# Patient Record
Sex: Female | Born: 1980 | Race: Black or African American | Hispanic: No | Marital: Single | Smoking: Never smoker
Health system: Southern US, Community
[De-identification: ages and names within clinical notes are randomized; demographics above are authoritative.]

## PROBLEM LIST (undated history)

## (undated) DIAGNOSIS — R51 Headache: Secondary | ICD-10-CM

## (undated) DIAGNOSIS — M1990 Unspecified osteoarthritis, unspecified site: Secondary | ICD-10-CM

## (undated) DIAGNOSIS — M549 Dorsalgia, unspecified: Secondary | ICD-10-CM

## (undated) DIAGNOSIS — M25562 Pain in left knee: Secondary | ICD-10-CM

## (undated) DIAGNOSIS — M25561 Pain in right knee: Secondary | ICD-10-CM

## (undated) DIAGNOSIS — Z3480 Encounter for supervision of other normal pregnancy, unspecified trimester: Secondary | ICD-10-CM

## (undated) DIAGNOSIS — R519 Headache, unspecified: Secondary | ICD-10-CM

## (undated) DIAGNOSIS — D649 Anemia, unspecified: Secondary | ICD-10-CM

## (undated) DIAGNOSIS — M25569 Pain in unspecified knee: Secondary | ICD-10-CM

## (undated) DIAGNOSIS — IMO0001 Reserved for inherently not codable concepts without codable children: Secondary | ICD-10-CM

## (undated) HISTORY — DX: Reserved for inherently not codable concepts without codable children: IMO0001

## (undated) HISTORY — DX: Pain in unspecified knee: M25.569

## (undated) HISTORY — DX: Encounter for supervision of other normal pregnancy, unspecified trimester: Z34.80

## (undated) HISTORY — PX: CARPAL TUNNEL RELEASE: SHX101

## (undated) HISTORY — PX: WISDOM TOOTH EXTRACTION: SHX21

## (undated) HISTORY — DX: Pain in left knee: M25.562

## (undated) HISTORY — DX: Dorsalgia, unspecified: M54.9

## (undated) HISTORY — DX: Pain in right knee: M25.561

---

## 1998-08-14 ENCOUNTER — Emergency Department (HOSPITAL_COMMUNITY): Admission: EM | Admit: 1998-08-14 | Discharge: 1998-08-14 | Payer: Self-pay | Admitting: Emergency Medicine

## 1999-08-08 ENCOUNTER — Inpatient Hospital Stay (HOSPITAL_COMMUNITY): Admission: AD | Admit: 1999-08-08 | Discharge: 1999-08-08 | Payer: Self-pay | Admitting: *Deleted

## 1999-10-30 ENCOUNTER — Inpatient Hospital Stay (HOSPITAL_COMMUNITY): Admission: AD | Admit: 1999-10-30 | Discharge: 1999-10-30 | Payer: Self-pay | Admitting: *Deleted

## 1999-10-31 ENCOUNTER — Ambulatory Visit (HOSPITAL_COMMUNITY): Admission: RE | Admit: 1999-10-31 | Discharge: 1999-10-31 | Payer: Self-pay | Admitting: *Deleted

## 1999-11-15 ENCOUNTER — Inpatient Hospital Stay (HOSPITAL_COMMUNITY): Admission: AD | Admit: 1999-11-15 | Discharge: 1999-11-15 | Payer: Self-pay | Admitting: *Deleted

## 2000-03-13 ENCOUNTER — Inpatient Hospital Stay (HOSPITAL_COMMUNITY): Admission: AD | Admit: 2000-03-13 | Discharge: 2000-03-13 | Payer: Self-pay | Admitting: Obstetrics

## 2000-03-24 ENCOUNTER — Inpatient Hospital Stay (HOSPITAL_COMMUNITY): Admission: AD | Admit: 2000-03-24 | Discharge: 2000-03-24 | Payer: Self-pay | Admitting: Obstetrics & Gynecology

## 2000-03-25 ENCOUNTER — Encounter (HOSPITAL_COMMUNITY): Admission: RE | Admit: 2000-03-25 | Discharge: 2000-04-03 | Payer: Self-pay | Admitting: Obstetrics & Gynecology

## 2000-04-02 ENCOUNTER — Inpatient Hospital Stay (HOSPITAL_COMMUNITY): Admission: AD | Admit: 2000-04-02 | Discharge: 2000-04-04 | Payer: Self-pay | Admitting: Obstetrics

## 2000-11-06 ENCOUNTER — Emergency Department (HOSPITAL_COMMUNITY): Admission: EM | Admit: 2000-11-06 | Discharge: 2000-11-06 | Payer: Self-pay | Admitting: Emergency Medicine

## 2000-11-06 ENCOUNTER — Encounter: Payer: Self-pay | Admitting: Emergency Medicine

## 2001-06-02 ENCOUNTER — Emergency Department (HOSPITAL_COMMUNITY): Admission: EM | Admit: 2001-06-02 | Discharge: 2001-06-03 | Payer: Self-pay | Admitting: Emergency Medicine

## 2001-09-26 ENCOUNTER — Inpatient Hospital Stay (HOSPITAL_COMMUNITY): Admission: AD | Admit: 2001-09-26 | Discharge: 2001-09-26 | Payer: Self-pay | Admitting: Obstetrics and Gynecology

## 2001-09-26 ENCOUNTER — Encounter: Payer: Self-pay | Admitting: Obstetrics and Gynecology

## 2001-09-26 ENCOUNTER — Encounter: Payer: Self-pay | Admitting: Emergency Medicine

## 2002-09-06 ENCOUNTER — Emergency Department (HOSPITAL_COMMUNITY): Admission: EM | Admit: 2002-09-06 | Discharge: 2002-09-06 | Payer: Self-pay | Admitting: Emergency Medicine

## 2002-11-06 ENCOUNTER — Emergency Department (HOSPITAL_COMMUNITY): Admission: EM | Admit: 2002-11-06 | Discharge: 2002-11-06 | Payer: Self-pay | Admitting: Emergency Medicine

## 2002-11-06 ENCOUNTER — Encounter: Payer: Self-pay | Admitting: Emergency Medicine

## 2002-12-13 ENCOUNTER — Inpatient Hospital Stay (HOSPITAL_COMMUNITY): Admission: AD | Admit: 2002-12-13 | Discharge: 2002-12-13 | Payer: Self-pay | Admitting: Obstetrics and Gynecology

## 2002-12-13 ENCOUNTER — Encounter: Payer: Self-pay | Admitting: Obstetrics and Gynecology

## 2002-12-29 ENCOUNTER — Other Ambulatory Visit: Admission: RE | Admit: 2002-12-29 | Discharge: 2002-12-29 | Payer: Self-pay | Admitting: Obstetrics and Gynecology

## 2003-07-08 ENCOUNTER — Inpatient Hospital Stay (HOSPITAL_COMMUNITY): Admission: AD | Admit: 2003-07-08 | Discharge: 2003-07-08 | Payer: Self-pay | Admitting: Obstetrics and Gynecology

## 2003-07-09 ENCOUNTER — Inpatient Hospital Stay (HOSPITAL_COMMUNITY): Admission: AD | Admit: 2003-07-09 | Discharge: 2003-07-11 | Payer: Self-pay | Admitting: Obstetrics and Gynecology

## 2004-01-10 ENCOUNTER — Other Ambulatory Visit: Admission: RE | Admit: 2004-01-10 | Discharge: 2004-01-21 | Payer: Self-pay | Admitting: Advanced Practice Midwife

## 2005-02-02 ENCOUNTER — Other Ambulatory Visit: Admission: RE | Admit: 2005-02-02 | Discharge: 2005-02-02 | Payer: Self-pay | Admitting: Obstetrics and Gynecology

## 2005-08-17 ENCOUNTER — Emergency Department (HOSPITAL_COMMUNITY): Admission: EM | Admit: 2005-08-17 | Discharge: 2005-08-17 | Payer: Self-pay | Admitting: Family Medicine

## 2006-07-29 ENCOUNTER — Emergency Department (HOSPITAL_COMMUNITY): Admission: EM | Admit: 2006-07-29 | Discharge: 2006-07-29 | Payer: Self-pay | Admitting: Family Medicine

## 2006-09-28 ENCOUNTER — Emergency Department (HOSPITAL_COMMUNITY): Admission: EM | Admit: 2006-09-28 | Discharge: 2006-09-28 | Payer: Self-pay | Admitting: Emergency Medicine

## 2007-06-18 ENCOUNTER — Emergency Department (HOSPITAL_COMMUNITY): Admission: EM | Admit: 2007-06-18 | Discharge: 2007-06-18 | Payer: Self-pay | Admitting: Family Medicine

## 2007-07-27 ENCOUNTER — Inpatient Hospital Stay (HOSPITAL_COMMUNITY): Admission: AD | Admit: 2007-07-27 | Discharge: 2007-07-29 | Payer: Self-pay | Admitting: Obstetrics and Gynecology

## 2007-11-15 ENCOUNTER — Emergency Department (HOSPITAL_COMMUNITY): Admission: EM | Admit: 2007-11-15 | Discharge: 2007-11-15 | Payer: Self-pay | Admitting: Family Medicine

## 2007-12-04 ENCOUNTER — Emergency Department (HOSPITAL_COMMUNITY): Admission: EM | Admit: 2007-12-04 | Discharge: 2007-12-04 | Payer: Self-pay | Admitting: Emergency Medicine

## 2008-02-16 ENCOUNTER — Emergency Department (HOSPITAL_COMMUNITY): Admission: EM | Admit: 2008-02-16 | Discharge: 2008-02-16 | Payer: Self-pay | Admitting: Emergency Medicine

## 2009-02-14 ENCOUNTER — Emergency Department (HOSPITAL_COMMUNITY): Admission: EM | Admit: 2009-02-14 | Discharge: 2009-02-14 | Payer: Self-pay | Admitting: Emergency Medicine

## 2010-04-26 ENCOUNTER — Emergency Department (HOSPITAL_COMMUNITY): Admission: EM | Admit: 2010-04-26 | Discharge: 2010-04-26 | Payer: Self-pay | Admitting: Family Medicine

## 2011-03-27 NOTE — H&P (Signed)
NAME:  Diane Harvey, Diane Harvey                  ACCOUNT NO.:  [id number]   MEDICAL RECORD NO.:  [id number]          PATIENT TYPE:  INP   LOCATION:  9133                          FACILITY:  [facility]   PHYSICIAN:  Naima A. Dillard, M.D. DATE OF BIRTH:  [birth year]   DATE OF ADMISSION:  07/27/2007  DATE OF DISCHARGE:                              HISTORY & PHYSICAL   HISTORY OF PRESENT ILLNESS:  The patient is a 30 year old, G3, P2-0-0-2  who presents at 41-1/7 weeks with complaints of increased frequency of  uterine contractions with increased intensity as well.  The patient  denies leakage of fluid or bleeding.  The patient reports her fetus has  been moving normally.  The patient denies any toxic signs or symptoms.  The patient's Group B Streptococcus is negative.  The patient's last  cervical exam on Thursday, September 11, was closed, per patient.  Pregnancy is remarkable for long cycle.  The patient's pregnancy has  been followed by the CNM service at [facility].   ALLERGIES:  No known drug allergies.   CURRENT MEDICATIONS:  Prenatal vitamins.   PAST OBSTETRICAL HISTORY:  Pregnancy #1 in May 2001, with spontaneous  vaginal delivery at 41 weeks of a female infant, 7 pounds 0 ounces, 20-  hour labor, no complications.  Pregnancy #2 in August 2004, spontaneous  vaginal delivery at 42 weeks of a female infant, 6 pounds 0 ounces, 5-hour  labor with no complications.  Pregnancy #3, current and different  paternity.  No obstetric complications.   PAST GYNECOLOGICAL HISTORY:  The patient reports regular monthly menses  with heavy flow.  The patient denies any history of abnormal Pap smears  or STDs.  The patient does have a history of bacterial and yeast  infections in the past.   PAST SURGICAL HISTORY:  Negative.   PAST MEDICAL HISTORY:  Negative.   FAMILY HISTORY:  Mother with chronic hypertension.  Father with chronic  hypertension.  Sister with asthma.  Paternal grandmother with  Alzheimer's.  Father with diabetes.   SOCIAL HISTORY:  The patient is separated currently.  Father of the baby  is Brennan Bailey.  He is involved and supportive.  The patient works in  Engineering geologist.  Father of the baby is a Estate agent.  The patient denies  use of alcohol, tobacco or street drugs.   PRENATAL LABORATORY DATA:  Initial hemoglobin 11.3, platelets 334,000.  Blood type O positive.  Antibody screen negative.  Sickle cell trait  negative.  RPR nonreactive.  Rubella titer immune.  Hepatitis B surface  antigen negative.  HIV was declined.  Pap smear on January 24, 2007,  within normal limits.  Gonorrhea and Chlamydia test negative x2.  Cystic  fibrosis negative.  Group B Streptococcus negative.  Glucola 87.   PRENATAL COURSE:  The patient entered care at 55 weeks' gestation.  Quad  screen was drawn and was within normal limits.  The patient was treated  for bacterial vaginosis at 17 weeks with metranidazole.  The patient  underwent ultrasound at [redacted] weeks gestation by dates and had size  not  consistent with dates.  EDC changed to July 19, 2007.  Placenta  posterior and cervix long and closed.  Normal anatomy and normal fluid.  Fetal left kidney not well-visualized.  Repeat ultrasound obtained at 22  weeks for complete visualization of the fetal renal structures and at  that time left kidney appeared normal with a normal AFI and cervix 4.4  cm.  Glucola was obtained at 29 weeks with a normal result.  The patient  underwent Group B Streptococcus culture at 35-36 weeks.  The patient  also reports ultrasound performed on September 11, at which time fluid  volume was reported to her as normal, baby in vertex presentation and  estimated fetal weight at 7 pounds approximately.   PHYSICAL EXAMINATION:  VITAL SIGNS:  The patient is afebrile.  Vital  signs are stable.  Blood pressure is 118/75.  HEART:  Regular rate and rhythm.  LUNGS:  Clear.  ABDOMEN:  Soft, gravid and nontender.   Fundal height consistent with 39  cm.  Fetus is vertex to Leopold's maneuvers.  Fetal heart rate is  reactive with a variable deceleration x1 to 110-120 beats per minute at  the initial onset of fetal heart rate tracing, however, spontaneous  recovery and reactive fetal heart rate after the variable deceleration.  Short-term variability is present.  Uterine contractions are noted every  4-5 minutes and moderate to palpation.  PELVIC:  Sterile vaginal exam reveals cervix 4-5 cm dilated, 60%  effaced, -3 station, vertex and posterior.  EXTREMITIES:  Negative edema and negative Homans' bilaterally.   ASSESSMENT/PLAN:  1. Intrauterine pregnancy at 41-1/7 weeks.  2. Early labor.  3. The patient will be admitted to birthing suites per consult with      Dr. Normand Sloop due to post dates and variable deceleration.  The      patient desires epidural for pain relief.  Routine certified nurse      midwife orders.  4. If having decreased contraction frequency, will consider Pitocin      augmentation.      Rhona Leavens, CNM      Naima A. Normand Sloop, M.D.  Electronically Signed    NOS/MEDQ  D:  07/27/2007  T:  07/28/2007  Job:  [id number]

## 2011-03-30 NOTE — H&P (Signed)
NAME:  Harvey, Diane C                            ACCOUNT NO.:  [id number]   MEDICAL RECORD NO.:  [id number]                   PATIENT TYPE:  INP   LOCATION:  9130                                 [facility]ACILITY:  [facility]   PHYSICIAN:  Naima A. Dillard, M.D.              DATE OF BIRTH:  01/27/1981   DATE OF ADMISSION:  07/09/2003  DATE OF DISCHARGE:                                HISTORY & PHYSICAL   HISTORY OF PRESENT ILLNESS:  Diane Harvey is a 30 year old married black  female, gravida 2, para  1-0-0-1, at 41-1/7 weeks who presents from the  office for late labor evaluation.  She reports regular uterine contractions  all night and MAU visit last evening showed her cervix to be 1 cm and 70%  effaced.  She denies leaking, bleeding, nausea, vomiting and visual  disturbances.  Her pregnancy has been followed by [facility]  Certified Nurse Midwife Service and has been remarkable for:  1. First trimester bleeding.  2. Fibroid.  3. Group B Streptococcus negative.  <./PRENATAL LABORATORY DATA/>  Were collected on December 13, 2002.  Hemoglobin 12.3, hematocrit was not  available, platelets 339,000.  Blood type O positive.  Rh negative.  Sickle  cell trait negative.  RPR nonreactive.  Rubella immune.  Hepatitis B surface  antigen negative.  Pap smear within normal limits.  Gonorrhea negative.  Chlamydia negative.  On February 16, 2003, her maternal serum alpha fetoprotein  was within normal range.  On April 14, 2003, her one hour Glucola was 86 and  her hemoglobin at that time was 11.4.  Culture of the vaginal tract for  Group B Streptococcus, on June 10, 2003, was negative and was negative for  gonorrhea and Chlamydia as well.   PRENATAL HISTORY:  She presented for prenatal care at [facility] on December 29, 2002, at [redacted] weeks gestation.  Pregnancy  ultrasonograph, performed by [facility], on December 13, 2002, gave an  [facility] of July 01, 2003.  Second pregnancy ultrasonograph at  [redacted] weeks  gestation shows growth consistent with previous dating; a fibroid was noted  as well on that ultrasound in addition to EIF in the left ventricle.  The  rest of her prenatal care was unremarkable.   OBSTETRICAL HISTORY:  She is a gravida 2, para 1-0-0-1.  In May 2001, she  vaginally delivered a female infant at [redacted] weeks gestation, weighing 7 pounds  14 ounces, after eight hours of labor.  She had an epidural for anesthesia.  The infant's name was Diane Harvey.  There were no complications with that  pregnancy or birth.   PAST MEDICAL HISTORY:  1. She has no medication allergies.  2. She reports using oral contraceptives, which she stopped a year ago.  3. She had frequent yeast infections with her first pregnancy.   PAST SURGICAL HISTORY:  Negative.   FAMILY HISTORY:  Remarkable for parents with chronic hypertension.  Paternal  grandmother with history of varicosities.  Father with type 1 diabetes.   GENETIC HISTORY:  Remarkable for patient's nieces born with extra digits.   SOCIAL HISTORY:  The patient is married to the father of the baby.  His name  is Careers adviser.  They both have high school educations and are employed full  time.  They deny any alcohol, tobacco or illicit drug use with the  pregnancy.   PHYSICAL EXAMINATION:  VITAL SIGNS:  Stable.  She is afebrile.  HEENT:  Grossly within normal limits.  CHEST:  Clear to auscultation.  HEART:  Regular rate and rhythm.  ABDOMEN:  Gravid in contour with fundal height extending approximately 41 cm  above the pubic symphysis.  Fetal heart rate is reactive and reassuring.  Uterine contractions q.2-4 min which are strong.  PELVIC:  Cervix 4 cm, 70%, vertex, -2, intact membranes.  EXTREMITIES:  Within normal limits.   ASSESSMENT:  1. Intrauterine pregnancy at term.  2. Early active labor.  3. Group B Streptococcus negative.   PLAN:  1. Admit to birthing suite for consultation with Dr. Normand Sloop.  2. Routine CNM orders.  3. The  patient plans epidural for labor.     Cam Hai, C.N.M.                     Naima A. Normand Sloop, M.D.    KS/MEDQ  D:  07/09/2003  T:  07/10/2003  Job:  [id number]

## 2011-07-31 ENCOUNTER — Encounter: Payer: Self-pay | Admitting: Family Medicine

## 2011-07-31 ENCOUNTER — Ambulatory Visit (INDEPENDENT_AMBULATORY_CARE_PROVIDER_SITE_OTHER): Payer: Medicaid Other | Admitting: Family Medicine

## 2011-07-31 VITALS — BP 127/91 | HR 88 | Ht 67.0 in | Wt 233.0 lb

## 2011-07-31 DIAGNOSIS — M25569 Pain in unspecified knee: Secondary | ICD-10-CM | POA: Insufficient documentation

## 2011-07-31 DIAGNOSIS — IMO0001 Reserved for inherently not codable concepts without codable children: Secondary | ICD-10-CM

## 2011-07-31 DIAGNOSIS — Z309 Encounter for contraceptive management, unspecified: Secondary | ICD-10-CM

## 2011-07-31 DIAGNOSIS — Z0000 Encounter for general adult medical examination without abnormal findings: Secondary | ICD-10-CM

## 2011-07-31 DIAGNOSIS — Z3049 Encounter for surveillance of other contraceptives: Secondary | ICD-10-CM

## 2011-07-31 HISTORY — DX: Reserved for inherently not codable concepts without codable children: IMO0001

## 2011-07-31 HISTORY — DX: Pain in unspecified knee: M25.569

## 2011-07-31 LAB — POCT URINE PREGNANCY: Preg Test, Ur: NEGATIVE

## 2011-07-31 MED ORDER — MEDROXYPROGESTERONE ACETATE 150 MG/ML IM SUSP
150.0000 mg | Freq: Once | INTRAMUSCULAR | Status: AC
  Administered 2011-07-31: 150 mg via INTRAMUSCULAR

## 2011-07-31 NOTE — Progress Notes (Signed)
  Subjective:    Patient ID: Diane Harvey, female    DOB: 15-Mar-1981, 30 y.o.   MRN: [id number]  HPI  Here to etsablish care.  Would like to resume getting depo shots for conraception.  States had a physical with normal pap and thinks she got fasting labs and cholesterol done in March.  Review of Systems Gen:  No fever, chills, unexplained weight loss Ears:  No hearing loss, ringing Eyes: No vision changes, double vision, eye drainage Nose:  No rhinorrhea, congestion Throat:  No sore throat or dysphagia CV:  No chest pain, palpitations, PND, dyspnea on exertion, or edema Resp: No cough, dyspnea, wheezing Abd: No nausea, vomting, diarrhea, constipation, or change in bowel color, size, or caliber. MSK: no joint pain, myalgias SKIN: no rash, changing moles GU: No dysuria, hematuria, vaginal discharge Neuro:  No headache, numbness, weakness, tingling, syncope.      Objective:   Physical Exam GEN: Alert & Oriented, No acute distress CV:  Regular Rate & Rhythm, no murmur Respiratory:  Normal work of breathing, CTAB Abd:  + BS, soft, no tenderness to palpation Ext: no pre-tibial edema        Assessment & Plan:

## 2011-07-31 NOTE — Patient Instructions (Signed)
Encourage daily activity See nutrition tips for weight loss.  Please feel free to schedule appt to discuss further.  Bring in 3 day log. Will wait on checking for diabetes and high cholesterol since you just had this done Will get records from your family doctor Follow-up with orthopedist for your knees.  Ice and ibuprofen for ankle strain.  Write alphabet with your big toe to help keep your ankle strong.  Think about tetanus and flu shots.  Next annual visit March 2013

## 2011-07-31 NOTE — Assessment & Plan Note (Addendum)
Asked for records of CBG, lipids, and last pap smear.  States were normal by her record.  Discussed obesity and mildy elevated BP.  Advised to follow-up for further counseling.  Otherwise annual follow-up

## 2011-08-23 LAB — CBC
Hemoglobin: 9.4 — ABNORMAL LOW
MCV: 70.8 — ABNORMAL LOW
RBC: 4.14
WBC: 11.6 — ABNORMAL HIGH

## 2011-08-24 LAB — CBC
HCT: 30.3 — ABNORMAL LOW
MCHC: 33.3
MCV: 69.8 — ABNORMAL LOW
RBC: 4.34
WBC: 6.8

## 2011-10-16 ENCOUNTER — Ambulatory Visit (INDEPENDENT_AMBULATORY_CARE_PROVIDER_SITE_OTHER): Payer: Medicaid Other | Admitting: *Deleted

## 2011-10-16 DIAGNOSIS — Z309 Encounter for contraceptive management, unspecified: Secondary | ICD-10-CM

## 2011-10-16 MED ORDER — MEDROXYPROGESTERONE ACETATE 150 MG/ML IM SUSP
150.0000 mg | Freq: Once | INTRAMUSCULAR | Status: AC
  Administered 2011-10-16: 150 mg via INTRAMUSCULAR

## 2012-01-08 ENCOUNTER — Ambulatory Visit (INDEPENDENT_AMBULATORY_CARE_PROVIDER_SITE_OTHER): Payer: Medicaid Other | Admitting: *Deleted

## 2012-01-08 DIAGNOSIS — Z309 Encounter for contraceptive management, unspecified: Secondary | ICD-10-CM

## 2012-01-08 DIAGNOSIS — Z3049 Encounter for surveillance of other contraceptives: Secondary | ICD-10-CM

## 2012-01-08 MED ORDER — MEDROXYPROGESTERONE ACETATE 150 MG/ML IM SUSP
150.0000 mg | Freq: Once | INTRAMUSCULAR | Status: AC
  Administered 2012-01-08: 150 mg via INTRAMUSCULAR

## 2012-01-08 NOTE — Progress Notes (Signed)
Next Depo due May 14 thru Apr 08, 2012

## 2012-03-24 ENCOUNTER — Ambulatory Visit (INDEPENDENT_AMBULATORY_CARE_PROVIDER_SITE_OTHER): Payer: Medicaid Other | Admitting: *Deleted

## 2012-03-24 DIAGNOSIS — Z309 Encounter for contraceptive management, unspecified: Secondary | ICD-10-CM

## 2012-03-24 MED ORDER — MEDROXYPROGESTERONE ACETATE 150 MG/ML IM SUSP
150.0000 mg | Freq: Once | INTRAMUSCULAR | Status: AC
  Administered 2012-03-24: 150 mg via INTRAMUSCULAR

## 2012-03-25 ENCOUNTER — Other Ambulatory Visit: Payer: Self-pay | Admitting: Family Medicine

## 2012-03-25 DIAGNOSIS — Z309 Encounter for contraceptive management, unspecified: Secondary | ICD-10-CM

## 2012-03-25 MED ORDER — MEDROXYPROGESTERONE ACETATE 150 MG/ML IM SUSP: 150.0000 mg | INTRAMUSCULAR | Status: DC

## 2012-06-10 ENCOUNTER — Ambulatory Visit (INDEPENDENT_AMBULATORY_CARE_PROVIDER_SITE_OTHER): Payer: Medicaid Other | Admitting: *Deleted

## 2012-06-10 DIAGNOSIS — Z309 Encounter for contraceptive management, unspecified: Secondary | ICD-10-CM

## 2012-06-10 MED ORDER — MEDROXYPROGESTERONE ACETATE 150 MG/ML IM SUSP
150.0000 mg | Freq: Once | INTRAMUSCULAR | Status: AC
  Administered 2012-06-10: 150 mg via INTRAMUSCULAR

## 2012-06-10 NOTE — Progress Notes (Signed)
Next depo due Oct 15 through Sep 09, 2012.

## 2012-07-11 ENCOUNTER — Encounter: Payer: Self-pay | Admitting: Family Medicine

## 2012-07-11 ENCOUNTER — Ambulatory Visit (INDEPENDENT_AMBULATORY_CARE_PROVIDER_SITE_OTHER): Payer: Medicaid Other | Admitting: Family Medicine

## 2012-07-11 VITALS — BP 136/96 | HR 90 | Temp 98.5°F | Ht 67.0 in | Wt 240.0 lb

## 2012-07-11 DIAGNOSIS — IMO0002 Reserved for concepts with insufficient information to code with codable children: Secondary | ICD-10-CM | POA: Insufficient documentation

## 2012-07-11 MED ORDER — CEPHALEXIN 500 MG PO CAPS: 500.0000 mg | ORAL_CAPSULE | Freq: Four times a day (QID) | ORAL | Status: AC

## 2012-07-11 NOTE — Assessment & Plan Note (Signed)
Of short duration, no evidence of pus or abscess at this time.  Will treat with keflex, discussed to seek medical care over the weekend if signs of worsening infection or pus collection.

## 2012-07-11 NOTE — Patient Instructions (Addendum)
Antibiotic Keflex one tablet twice a day for 7 days  Should start to improve over the next 24-48 hours.  Please go to urgent care for further evaluation if you notice worsening swelling, redness or pain.  May need drainage if progresses

## 2012-07-11 NOTE — Progress Notes (Signed)
  Subjective:    Patient ID: Diane Harvey, female    DOB: 05-27-1981, 31 y.o.   MRN: [id number]  HPIWork in appt for right ring finger infection  Woke up this morning with right ring finger sore near the nail.  No injury or inciting event.   Does remember picking her cuticle earlier this week but no pain until this morning.  NO pus, no fever, chills.    I have reviewed patient's  PMH, FH, and Social history and Medications as related to this visit. No history of immunocompromise, recent abx    Review of Systems See HPI    Objective:   Physical Exam GEN: NAD Right ring finger: medial edge slightly edematous, erythematous greater than lateral side.  Finger pad not affected.  No abscess, induration, pus.  Sensation intact.      Assessment & Plan:

## 2012-07-17 ENCOUNTER — Encounter: Payer: Self-pay | Admitting: Family Medicine

## 2012-07-17 ENCOUNTER — Ambulatory Visit (INDEPENDENT_AMBULATORY_CARE_PROVIDER_SITE_OTHER): Payer: Medicaid Other | Admitting: Family Medicine

## 2012-07-17 VITALS — BP 145/91 | HR 96 | Temp 98.4°F | Ht 67.0 in | Wt 241.0 lb

## 2012-07-17 DIAGNOSIS — IMO0002 Reserved for concepts with insufficient information to code with codable children: Secondary | ICD-10-CM

## 2012-07-17 NOTE — Patient Instructions (Addendum)
Finish the antibiotic  Use warm soaks as needed  Use antibiotic ointment over the area and keep covered with bandage for the next few days until it heals   Follow-up if the pain or redness worsens

## 2012-07-17 NOTE — Assessment & Plan Note (Signed)
Incision and drainage performed and pus drained Given care instructions Follow-up as needed Finish course of antibiotic prescribed on 08/30

## 2012-07-17 NOTE — Progress Notes (Signed)
  Subjective:    Patient ID: Diane PATERSON, female    DOB: 1981-08-15, 31 y.o.   MRN: [id number]  HPI # Paronychia  She feels her finger is getting worse despite starting the antibiotics which she was prescribed 08/30  She thinks this happened because she bites her fingernails  Review of Systems Denies fevers/chills/nausea  Allergies, medication, past medical history reviewed.     Objective:   Physical Exam GEN: appears uncomfortable RIGHT RING FINGER: 1 cm area of pus around and erythema around medial and lateral nail bed near thumb; very tender  Procedure note: paronychia incision and drainage Written consent discussed and signed.  Area prepped with Betadine x 2 and alcohol swabs x 2. Freezing spray applied and cuticle separated from nail bed with #15 blade 2 mL of pus expressed Minimal blood loss Area covered with bandaid    Assessment & Plan:

## 2012-08-27 ENCOUNTER — Encounter: Payer: Medicaid Other | Admitting: Family Medicine

## 2012-09-02 ENCOUNTER — Ambulatory Visit (INDEPENDENT_AMBULATORY_CARE_PROVIDER_SITE_OTHER): Payer: Medicaid Other | Admitting: *Deleted

## 2012-09-02 ENCOUNTER — Encounter: Payer: Medicaid Other | Admitting: Family Medicine

## 2012-09-02 DIAGNOSIS — Z309 Encounter for contraceptive management, unspecified: Secondary | ICD-10-CM

## 2012-09-02 MED ORDER — MEDROXYPROGESTERONE ACETATE 150 MG/ML IM SUSP
150.0000 mg | Freq: Once | INTRAMUSCULAR | Status: AC
  Administered 2012-09-02: 150 mg via INTRAMUSCULAR

## 2012-09-02 NOTE — Progress Notes (Signed)
Next depo Jan 7 through Dec 02, 2012.

## 2012-09-15 ENCOUNTER — Encounter: Payer: Medicaid Other | Admitting: Family Medicine

## 2012-09-26 ENCOUNTER — Encounter: Payer: Self-pay | Admitting: Family Medicine

## 2012-09-26 ENCOUNTER — Other Ambulatory Visit (HOSPITAL_COMMUNITY)
Admission: RE | Admit: 2012-09-26 | Discharge: 2012-09-26 | Disposition: A | Discharge status: Home / Self Care | Payer: Medicaid Other | Source: Ambulatory Visit | Attending: Family Medicine | Admitting: Family Medicine

## 2012-09-26 ENCOUNTER — Ambulatory Visit (INDEPENDENT_AMBULATORY_CARE_PROVIDER_SITE_OTHER): Payer: Medicaid Other | Admitting: Family Medicine

## 2012-09-26 VITALS — BP 146/92 | HR 94 | Temp 98.4°F | Ht 67.0 in | Wt 233.0 lb

## 2012-09-26 DIAGNOSIS — Z0000 Encounter for general adult medical examination without abnormal findings: Secondary | ICD-10-CM

## 2012-09-26 DIAGNOSIS — Z862 Personal history of diseases of the blood and blood-forming organs and certain disorders involving the immune mechanism: Secondary | ICD-10-CM

## 2012-09-26 DIAGNOSIS — Z01419 Encounter for gynecological examination (general) (routine) without abnormal findings: Secondary | ICD-10-CM | POA: Insufficient documentation

## 2012-09-26 DIAGNOSIS — Z1151 Encounter for screening for human papillomavirus (HPV): Secondary | ICD-10-CM | POA: Insufficient documentation

## 2012-09-26 DIAGNOSIS — M25569 Pain in unspecified knee: Secondary | ICD-10-CM

## 2012-09-26 DIAGNOSIS — R87810 Cervical high risk human papillomavirus (HPV) DNA test positive: Secondary | ICD-10-CM | POA: Insufficient documentation

## 2012-09-26 DIAGNOSIS — Z124 Encounter for screening for malignant neoplasm of cervix: Secondary | ICD-10-CM

## 2012-09-26 DIAGNOSIS — E669 Obesity, unspecified: Secondary | ICD-10-CM

## 2012-09-26 DIAGNOSIS — Z833 Family history of diabetes mellitus: Secondary | ICD-10-CM

## 2012-09-26 NOTE — Assessment & Plan Note (Signed)
Pap performed, declines std, flu.  Follow-up annually

## 2012-09-26 NOTE — Patient Instructions (Addendum)
May continue with depo shots- discussed risk of osetoporisis, taking calcium and weight bearing exercise  Good job with starting walking and weight loss!!!  Keep up the good work  See info on exercises for knee pain  Follow-up for fasting lab work

## 2012-09-26 NOTE — Assessment & Plan Note (Signed)
Congratulated on weight loss due to increased walking.  Advised continued efforts.

## 2012-09-26 NOTE — Assessment & Plan Note (Signed)
likley patellofemoral syndrome, possible some early arthritis.  Advised weight loss, ice, NSAIDS and gave detailed instructions of home exercise program for strengthening.

## 2012-09-26 NOTE — Progress Notes (Signed)
  Subjective:    Patient ID: Diane Harvey, female    DOB: 10/04/1981, 31 y.o.   MRN: [id number]  HPI  Annual Gynecological Exam  G3P3 Wt Readings from Last 3 Encounters:  09/26/12 233 lb (105.688 kg)  07/17/12 241 lb (109.317 kg)  07/11/12 240 lb (108.863 kg)   Last period:  on depo Regular periods: depo Heavy bleeding: no  Sexually active: yes Birth control or hormonal therapy: depo Hx of STD: Patient desires STD screening Dyspareunia: No Hot flashes: No Vaginal discharge:no Dysuria:No  Last mammogram:no Breast mass or concerns: No Last Pap: 1-2 years ago? History of abnormal pap: No  FH of breast, uterine, ovarian, colon cancer: No    Knee pain:  Over a year.  Mostly right knee, popping, feels like "scrubbing" in knees.  Worse when walking.  Sometimes hears popping when walking up steps.  Gives out at times.  Has been prescribed a medicine in the past, it helped some.  Has also gotten cortisone shots in knees which have also helped in the past.  Review of Systems     Objective:   Physical Exam GEN: Alert & Oriented, No acute distress CV:  Regular Rate & Rhythm, no murmur Respiratory:  Normal work of breathing, CTAB Abd:  + BS, soft, no tenderness to palpation Ext: no pre-tibial edema Pelvic Exam:        External: normal female genitalia without lesions or masses        Vagina: normal without lesions or masses        Cervix: normal without lesions or masses        Adnexa: normal bimanual exam without masses or fullness        Uterus: normal by palpation        Pap smear: performed Knee:  Pain under right kneecaps.  ? Tracking.  No crepitus. No swelling        Assessment & Plan:

## 2012-10-02 ENCOUNTER — Encounter: Payer: Self-pay | Admitting: Family Medicine

## 2012-11-18 ENCOUNTER — Ambulatory Visit (INDEPENDENT_AMBULATORY_CARE_PROVIDER_SITE_OTHER): Payer: Medicaid Other | Admitting: *Deleted

## 2012-11-18 DIAGNOSIS — Z309 Encounter for contraceptive management, unspecified: Secondary | ICD-10-CM

## 2012-11-18 MED ORDER — MEDROXYPROGESTERONE ACETATE 150 MG/ML IM SUSP
150.0000 mg | Freq: Once | INTRAMUSCULAR | Status: AC
  Administered 2012-11-18: 150 mg via INTRAMUSCULAR

## 2012-12-31 ENCOUNTER — Encounter: Payer: Self-pay | Admitting: Family Medicine

## 2012-12-31 ENCOUNTER — Ambulatory Visit (INDEPENDENT_AMBULATORY_CARE_PROVIDER_SITE_OTHER): Payer: Medicaid Other | Admitting: Family Medicine

## 2012-12-31 VITALS — BP 123/82 | HR 94 | Ht 67.0 in | Wt 234.0 lb

## 2012-12-31 DIAGNOSIS — M545 Low back pain, unspecified: Secondary | ICD-10-CM

## 2012-12-31 MED ORDER — CYCLOBENZAPRINE HCL 10 MG PO TABS: 10.0000 mg | ORAL_TABLET | Freq: Every evening | ORAL | Status: DC | PRN

## 2012-12-31 MED ORDER — IBUPROFEN 600 MG PO TABS: 600.0000 mg | ORAL_TABLET | Freq: Four times a day (QID) | ORAL | Status: DC

## 2012-12-31 NOTE — Progress Notes (Signed)
  Subjective:    Patient ID: Diane Harvey, female    DOB: [birth year], 32 y.o.   MRN: [id number]  HPI 32 y.o. female with acute low back pain after moving yesterday. Mid low back and left shoulder. Worse when bending forward or when turns to the left. No radiation to legs, no weakness or numbness, no bowel or bladder changes.   Took ibuprofen - 600 mg, didn't work.    Review of Systems  Constitutional: Negative for fever and chills.  HENT: Negative for neck pain and neck stiffness.   Neurological: Negative for weakness and numbness.       Objective:   Physical Exam  Constitutional: She is oriented to person, place, and time. She appears well-developed and well-nourished. No distress.  HENT:  Head: Normocephalic and atraumatic.  Eyes: Conjunctivae and EOM are normal.  Neck: Normal range of motion. Neck supple.  Cardiovascular: Normal rate, regular rhythm and normal heart sounds.   Pulmonary/Chest: Effort normal and breath sounds normal. No respiratory distress.  Neurological: She is alert and oriented to person, place, and time. She has normal reflexes. She displays normal reflexes. She exhibits normal muscle tone.  Skin: Skin is warm and dry.  Psychiatric: She has a normal mood and affect.   Filed Vitals:   12/31/12 1515  BP: 123/82  Pulse: 94      Assessment & Plan:  32 y.o. female with acute lumbar strain and left shoulder strain. - Continue ibuprofen and tylenol - Flexeril - Heat/ice, massage - Gentle stretching once acute pain improved  Napoleon Form, MD

## 2012-12-31 NOTE — Patient Instructions (Signed)

## 2013-02-03 ENCOUNTER — Ambulatory Visit (INDEPENDENT_AMBULATORY_CARE_PROVIDER_SITE_OTHER): Payer: Medicaid Other | Admitting: *Deleted

## 2013-02-03 DIAGNOSIS — Z309 Encounter for contraceptive management, unspecified: Secondary | ICD-10-CM

## 2013-02-03 MED ORDER — MEDROXYPROGESTERONE ACETATE 150 MG/ML IM SUSP
150.0000 mg | Freq: Once | INTRAMUSCULAR | Status: AC
  Administered 2013-02-03: 150 mg via INTRAMUSCULAR

## 2013-02-03 NOTE — Progress Notes (Signed)
Next depo due June 10 through May 05, 2013.

## 2013-05-06 ENCOUNTER — Ambulatory Visit (INDEPENDENT_AMBULATORY_CARE_PROVIDER_SITE_OTHER): Payer: Medicaid Other | Admitting: *Deleted

## 2013-05-06 DIAGNOSIS — Z309 Encounter for contraceptive management, unspecified: Secondary | ICD-10-CM

## 2013-05-06 MED ORDER — MEDROXYPROGESTERONE ACETATE 150 MG/ML IM SUSP
150.0000 mg | Freq: Once | INTRAMUSCULAR | Status: AC
  Administered 2013-05-06: 150 mg via INTRAMUSCULAR

## 2013-05-06 NOTE — Progress Notes (Signed)
Pt here for depo. Depo given RUOQ. Next depo due sept 10-24 - reminder card given Wyatt Haste, RN-BSN

## 2013-08-11 ENCOUNTER — Ambulatory Visit (INDEPENDENT_AMBULATORY_CARE_PROVIDER_SITE_OTHER): Payer: Medicaid Other | Admitting: *Deleted

## 2013-08-11 DIAGNOSIS — Z309 Encounter for contraceptive management, unspecified: Secondary | ICD-10-CM

## 2013-08-11 MED ORDER — MEDROXYPROGESTERONE ACETATE 150 MG/ML IM SUSP
150.0000 mg | Freq: Once | INTRAMUSCULAR | Status: AC
  Administered 2013-08-11: 150 mg via INTRAMUSCULAR

## 2013-08-11 NOTE — Progress Notes (Signed)
Patient here today to receive Depo Provera.  One week late for injection.  States she was spotting last week and bleeding is heavier today.  Last intercourse was 1 month ago.  Will obtain upreg today.  Upreg negative.  Depo Provera given--LUOQ.  Next Depo due Dec. 16-30.  Reminder card given. Gaylene Brooks, RN

## 2013-09-17 ENCOUNTER — Other Ambulatory Visit: Payer: Self-pay

## 2013-10-22 ENCOUNTER — Encounter: Payer: Medicaid Other | Admitting: Family Medicine

## 2013-10-27 ENCOUNTER — Encounter: Payer: Self-pay | Admitting: Family Medicine

## 2013-10-27 ENCOUNTER — Ambulatory Visit (INDEPENDENT_AMBULATORY_CARE_PROVIDER_SITE_OTHER): Payer: Self-pay | Admitting: Family Medicine

## 2013-10-27 VITALS — BP 145/87 | HR 90 | Temp 98.5°F | Ht 67.0 in | Wt 228.0 lb

## 2013-10-27 DIAGNOSIS — Z23 Encounter for immunization: Secondary | ICD-10-CM

## 2013-10-27 DIAGNOSIS — R209 Unspecified disturbances of skin sensation: Secondary | ICD-10-CM

## 2013-10-27 DIAGNOSIS — E669 Obesity, unspecified: Secondary | ICD-10-CM

## 2013-10-27 DIAGNOSIS — IMO0001 Reserved for inherently not codable concepts without codable children: Secondary | ICD-10-CM

## 2013-10-27 DIAGNOSIS — R200 Anesthesia of skin: Secondary | ICD-10-CM

## 2013-10-27 DIAGNOSIS — Z309 Encounter for contraceptive management, unspecified: Secondary | ICD-10-CM

## 2013-10-27 DIAGNOSIS — G5600 Carpal tunnel syndrome, unspecified upper limb: Secondary | ICD-10-CM | POA: Insufficient documentation

## 2013-10-27 LAB — BASIC METABOLIC PANEL
CO2: 26 mEq/L (ref 19–32)
Chloride: 103 mEq/L (ref 96–112)
Glucose, Bld: 86 mg/dL (ref 70–99)
Sodium: 136 mEq/L (ref 135–145)

## 2013-10-27 LAB — POCT GLYCOSYLATED HEMOGLOBIN (HGB A1C): Hemoglobin A1C: 5.5

## 2013-10-27 MED ORDER — SPLINT WRIST BRACE/LEFT-RIGHT MISC: 1.0000 | Freq: Every day | Status: DC

## 2013-10-27 MED ORDER — MEDROXYPROGESTERONE ACETATE 150 MG/ML IM SUSP
150.0000 mg | Freq: Once | INTRAMUSCULAR | Status: AC
  Administered 2013-10-27: 150 mg via INTRAMUSCULAR

## 2013-10-27 MED ORDER — GABAPENTIN 300 MG PO CAPS: 300.0000 mg | ORAL_CAPSULE | Freq: Every day | ORAL | Status: DC

## 2013-10-27 NOTE — Assessment & Plan Note (Addendum)
Your exam is consistent with carpal tunnel that is worse on the R. I will screen you for diabetes and hypothyroidism to make sure this is not presents as well.  For the carpal tunnel:  1. Please obtain a pair of wrist splints, wear them nightly and during the day as needed.  2. Please do the exercise provided 3. Please start  Gabapentin 300 mg at night. If this works well for you in two weeks you can start taking it twice daily daily, working up to three times daily by week 4 if needed.   Call to give Dr. Lum Babe  an update by week two and for a refill if needed.   Consider in office glucocorticoid injection vs. referral to hand surgery if no improvement with conservative measures.

## 2013-10-27 NOTE — Patient Instructions (Addendum)
Ms. Diane Harvey,  Thank you for coming in today. Your exam is consistent with carpal tunnel that is worse on the R. I will screen you for diabetes and hypothyroidism to make sure this is not presents as well.  For the carpal tunnel:  1. Please obtain a pair of wrist splints, wear them nightly and during the day as needed.  2. Please do the exercise provided 3. Please start  Gabapentin 300 mg at night. If this works well for you in two weeks you can start taking it twice daily daily, working up to three times daily by week 4 if needed.   Call to give Dr. Lum Babe  an update by week two and for a refill if needed.   Dr. Armen Pickup

## 2013-10-27 NOTE — Progress Notes (Signed)
   Subjective:    Patient ID: Diane Harvey, female    DOB: 10-12-1981, 32 y.o.   MRN: [id number]  HPI 32 yo F presents for wellness visit and to discuss the following:  1. Hand numbness: Bilateral hand and fingers R>L X one year Pins and needles sensation in fingers (excluding thumb), pain on R occasional radiates up arm to lateral epicondyle. Denies injury, neck pain and shoulder pain. Tried tylenol w/o relief. No other treatments. Pain is worse at night and wakes her from sleep. She works on a Museum/gallery exhibitions officer. Working x 6 months. Pain has made it hard to work, went from full time to part time.   2. Contraception: using depo. Has 3 children. Does not desire more children. Due for depo  3. Healthcare maintenance: decline flu shot. Will take TDap. Due for repeat pap in 2016.   Soc hx reviewed: non smoker  Review of Systems Patient Information Form: Screening and ROS  AUDIT-C Score: 0 Do you feel safe in relationships? yes PHQ-2:negative  Review of Symptoms  General:  Negative for nexplained weight loss, fever Skin: Negative for new or changing mole, sore that won't heal HEENT: Negative for trouble hearing, trouble seeing, ringing in ears, mouth sores, hoarseness, change in voice, dysphagia. CV:  Negative for chest pain, dyspnea, edema, palpitations Resp: Negative for cough, dyspnea, hemoptysis GI: Negative for nausea, vomiting, diarrhea, constipation, abdominal pain, melena, hematochezia. GU: Negative for dysuria, incontinence, urinary hesitance, hematuria, vaginal or penile discharge, polyuria, sexual difficulty, lumps in testicle or breasts MSK: Negative for muscle cramps or aches, joint pain or swelling Neuro: Negative for headaches, weakness, numbness, dizziness, passing out/fainting Psych: Negative for depression, anxiety, memory problems     Objective:   Physical Exam BP 145/87  Pulse 90  Temp(Src) 98.5 F (36.9 C) (Oral)  Ht 5\' 7"  (1.702 m)  Wt 228 lb (103.42 kg)   BMI 35.70 kg/m2 General appearance: alert, cooperative and no distress Ears: normal TM's and external ear canals both ears Nose: Nares normal. Septum midline. Mucosa normal. No drainage or sinus tenderness. Throat: lips, mucosa, and tongue normal; teeth and gums normal Neck: no adenopathy, no carotid bruit, no JVD, supple, symmetrical, trachea midline and thyroid not enlarged, symmetric, no tenderness/mass/nodules Lungs: clear to auscultation bilaterally Heart: regular rate and rhythm, S1, S2 normal, no murmur, click, rub or gallop Skin: normal Hands: no swelling, rash or atrophy. Slight decrease in grip strength on R compared to L.  Negative finkelstein. Negative tinels Positive phalen R >L  Full ROM in shoulders and neck w/o pain.  Neurologic: Grossly normal    Assessment & Plan:

## 2013-10-27 NOTE — Assessment & Plan Note (Signed)
6 lbs down from last visit.

## 2013-11-02 ENCOUNTER — Encounter: Payer: Self-pay | Admitting: Family Medicine

## 2013-11-23 ENCOUNTER — Telehealth: Payer: Self-pay | Admitting: Family Medicine

## 2013-11-23 NOTE — Telephone Encounter (Signed)
Refill request for gabapentin. Also, patient would like something sent in for pain from carpal tunnel in the daytime.

## 2013-11-24 ENCOUNTER — Other Ambulatory Visit: Payer: Self-pay | Admitting: Family Medicine

## 2013-11-24 DIAGNOSIS — G5600 Carpal tunnel syndrome, unspecified upper limb: Secondary | ICD-10-CM

## 2013-11-24 MED ORDER — GABAPENTIN 300 MG PO CAPS: 300.0000 mg | ORAL_CAPSULE | Freq: Every day | ORAL | Status: DC

## 2013-11-24 NOTE — Telephone Encounter (Signed)
Gabapentin refilled,also inform patient she can use Ibuprofen for pain as needed.

## 2013-11-24 NOTE — Telephone Encounter (Signed)
LMOVM for pt to return call .Fleeger, Jessica Dawn  

## 2014-01-12 ENCOUNTER — Encounter: Payer: Self-pay | Admitting: Family Medicine

## 2014-01-12 ENCOUNTER — Ambulatory Visit (INDEPENDENT_AMBULATORY_CARE_PROVIDER_SITE_OTHER): Payer: Self-pay | Admitting: Family Medicine

## 2014-01-12 VITALS — BP 138/92 | HR 80 | Temp 98.2°F | Ht 67.0 in | Wt 230.0 lb

## 2014-01-12 DIAGNOSIS — G5600 Carpal tunnel syndrome, unspecified upper limb: Secondary | ICD-10-CM

## 2014-01-12 MED ORDER — METHYLPREDNISOLONE ACETATE 40 MG/ML IJ SUSP
80.0000 mg | Freq: Once | INTRAMUSCULAR | Status: AC
  Administered 2014-01-12: 80 mg via INTRA_ARTICULAR

## 2014-01-12 NOTE — Progress Notes (Signed)
Subjective:     Patient ID: Diane Harvey, female   DOB: Jan 02, 1981, 33 y.o.   MRN: [id number]  HPI: Diane Harvey is a 33 yo female with a history of obesity that presents with a 3 month history of bilateral (R>L) tingling and numbness in her hands and wrist pain.  In mid-October, she was diagnosed with bilateral Carpel Tunnel and told to start splinting both wrists.  She was given Gabapentin and told to take that and Ibuprofen as needed for pain.  Since that time, her symptoms have persisted.  She gets numbness most often in her 3-5 digits, but it usually generalizes to her whole hand.  The tingling sensation radiates to both of her elbows.  Her symptoms are worse at night and with repetitive activities, such as writing.    The gabapentin relieved her pain for about a month, but then "stopped working."  She has not taken the Gabapentin in the last week.  She is also non-compliant with the splinting, as it restricts her daily activities.  Currently, she is just using Ibuprofen for pain relief.    NB: She endorsed paresthetic pain in both ulna and medial nerve distribution.  Review of Systems  Respiratory: Negative.   Cardiovascular: Negative.   Gastrointestinal: Negative.   All other systems reviewed and are negative.   No tingling or decreased sensation in upper extremities.  No joint pain or tenderness.      Objective:   Physical Exam  Nursing note and vitals reviewed. Constitutional: She is oriented to person, place, and time. She appears well-developed. No distress.  Cardiovascular: Normal rate, regular rhythm, normal heart sounds and intact distal pulses.   No murmur heard. Pulmonary/Chest: Effort normal and breath sounds normal. No respiratory distress. She has no wheezes.  Abdominal: Soft. Bowel sounds are normal. She exhibits no distension and no mass. There is no tenderness.  Musculoskeletal: Normal range of motion. She exhibits no edema.       Arms: Neurological: She is alert and  oriented to person, place, and time. No cranial nerve deficit or sensory deficit.   MSK:  R hand:  On Tinnel tapping, tingling felt in the 3-5 digits.  Significant pain on flexion, modest pain on extension.  No direct joint tenderness.    L hand:  On Tinnel tapping, tingling felt in the 3-5 digits.  Modest pain on flexion, modest pain on extension.  No direct joint tenderness.      Assessment:    The patient likely has carpel tunnel syndrome with a lack of improvement in symptoms.  Pain with flexion/extension, worse pain with repetitive movements, and the tingling in the hands support this diagnosis.  Ulnar neuropathy must be ruled out, given that the patient complains of more tingling in the 3-5 digits.  She has been noncompliant with splinting recommendations, but Gabapentin has not relieved her symptoms.  Can consider referral to surgery, after nerve conduction studies confirm the diagnosis of carpel tunnel.  In the meantime, joint injections may give her short term relief.    Plan:     1. Recommend getting splints that can be worn all the time. 2. Local steroid injection in both wrists for temporary relief of symptoms. 3. Nerve conduction EMG studies in upper extremities 4. F/u with patient about symptoms in 4-6 weeks. 5. Pending non-improvement in clinical symptoms and confirmation of carpel tunnel by EMG, surgery referral.  [place] ATTENDING  NOTE Kehinde Eniola,MD I  have seen and examined this patient, reviewed  their chart. I have discussed this patient with the medical student. I agree with his findings, assessment and care plan.The total time spent for this face to face encounter coordination or care and steroid injection was more than 30 min.

## 2014-01-12 NOTE — Patient Instructions (Signed)
Electromyography (EMG) Test This is a test in which very small electrodes are placed into your muscle tissue. It looks at the electrical impulses of your muscle tissue. This test is used to determine whether or not there are involuntary or spontaneous muscle movements. Involuntary or spontaneous means muscle movements that happen by themselves. This may indicate injury or disease of the nerves which supply that muscle. PREPARATION FOR TEST No preparation or fasting is necessary. Some stimulants such as caffeine and tobacco may need to be avoided for 2-3 hours before test or as instructed by your caregiver. NORMAL FINDINGS No evidence of neuromuscular abnormalities. Ranges for normal findings may vary among different laboratories and hospitals. You should always check with your doctor after having lab work or other tests done to discuss the meaning of your test results and whether your values are considered within normal limits. MEANING OF TEST  Your caregiver will go over the test results with you and discuss the importance and meaning of your results, as well as treatment options and the need for additional tests if necessary. OBTAINING THE TEST RESULTS It is your responsibility to obtain your test results. Ask the lab or department performing the test when and how you will get your results. Document Released: 03/01/2005 Document Revised: 01/21/2012 Document Reviewed: 10/08/2008 [employer] Patient Information 2014 [employer].  

## 2014-01-12 NOTE — Assessment & Plan Note (Signed)
Pain persist, worse on her right than the left. Patient requested for steroid injection, her wrists were injected with no complication. There was some improvement more on her right than the left. Plan to obtain EMG for diagnostic purpose and then refer to hand surgeon or orthopedic for surgical management. Continue Ibuprofen prn. F/U in 4 wks for reassessment.

## 2014-01-22 ENCOUNTER — Ambulatory Visit (INDEPENDENT_AMBULATORY_CARE_PROVIDER_SITE_OTHER): Payer: Medicaid Other | Admitting: *Deleted

## 2014-01-22 DIAGNOSIS — IMO0001 Reserved for inherently not codable concepts without codable children: Secondary | ICD-10-CM

## 2014-01-22 DIAGNOSIS — Z309 Encounter for contraceptive management, unspecified: Secondary | ICD-10-CM

## 2014-01-22 MED ORDER — MEDROXYPROGESTERONE ACETATE 150 MG/ML IM SUSP
150.0000 mg | Freq: Once | INTRAMUSCULAR | Status: AC
  Administered 2014-01-22: 150 mg via INTRAMUSCULAR

## 2014-01-22 NOTE — Progress Notes (Signed)
   Pt in for Depo Provera injection.  Pt tolerated Depo injection. Depo given Left upper outer quadrant.  Next injection due May 29 - April 23, 2014.  Reminder card given. Derl Barrow, RN

## 2014-01-25 ENCOUNTER — Ambulatory Visit (INDEPENDENT_AMBULATORY_CARE_PROVIDER_SITE_OTHER): Payer: Medicaid Other | Admitting: Neurology

## 2014-01-25 ENCOUNTER — Encounter: Payer: Self-pay | Admitting: Neurology

## 2014-01-25 DIAGNOSIS — G5600 Carpal tunnel syndrome, unspecified upper limb: Secondary | ICD-10-CM

## 2014-01-25 NOTE — Progress Notes (Signed)
See procedure note under "Notes" tab for EMG results.  Donika K. Patel, DO  

## 2014-01-25 NOTE — Procedures (Signed)
[facility]  [place], [place]  [place], [place] [address] Tel: [phone] Fax:  [phone] Test Date:  01/25/2014  Patient: Diane Harvey DOB: April 24, 1981 Physician: Narda Amber, DO  Sex: Female Height: 5\' 7"  Ref Phys: Andrena Mews  ID#: [id number] Temp: 36.2C Technician:    Patient Complaints: This is a 33 year-old female presenting with bilateral hand paresthesias and wrist pain.  NCV & EMG Findings: Extensive electrodiagnostic testing of the left upper extremity and additional studies of the right reveals: 1. Right median sensory response is prolonged (4.2 ms) and shows reduced amplitude (17.4 V). The left median sensory response is prolonged (3.5 ms) with preserved amplitude.  Bilateral ulnar sensory responses are normal. 2. The right median motor nerve showed prolonged distal onset latency (4.1 ms).  The left median and bilateral ulnar motor responses are normal. 3. No evidence of active or chronic motor axon loss affecting any of the tested muscles.  Impression: 1. Bilateral median neuropathy at or distal to the wrist, consistent with the clinical diagnosis of carpal tunnel syndrome.  Overall, these changes are moderate in degree electrically on the right and mild on the left side. 2. No evidence of a cervical radiculopathy affecting the left side.   ___________________________ Narda Amber, DO    Nerve Conduction Studies Anti Sensory Summary Table   Site NR Peak (ms) Norm Peak (ms) P-T Amp (V) Norm P-T Amp  Left Median Anti Sensory (2nd Digit)  Wrist    3.5 <3.4 34.3 >20  Right Median Anti Sensory (2nd Digit)  Wrist    4.2 <3.4 17.4 >20  Left Ulnar Anti Sensory (5th Digit)  Wrist    2.5 <3.1 33.8 >12  Right Ulnar Anti Sensory (5th Digit)  Wrist    2.4 <3.1 36.9 >12   Motor Summary Table   Site NR Onset (ms) Norm Onset (ms) O-P Amp (mV) Norm O-P Amp Site1 Site2 Delta-0 (ms) Dist (cm) Vel (m/s) Norm Vel (m/s)  Left Median Motor (Abd Poll  Brev)  Wrist    2.9 <3.9 8.9 >6 Elbow Wrist 5.0 29.0 58 >50  Elbow    7.9  8.3         Right Median Motor (Abd Poll Brev)  Wrist    4.1 <3.9 9.7 >6 Elbow Wrist 5.0 29.0 58 >50  Elbow    9.1  9.6         Left Ulnar Motor (Abd Dig Minimi)  Wrist    1.9 <3.1 7.8 >7 B Elbow Wrist 3.8 24.0 63 >50  B Elbow    5.7  7.8  A Elbow B Elbow 1.8 10.0 56 >50  A Elbow    7.5  7.8         Right Ulnar Motor (Abd Dig Minimi)  Wrist    2.0 <3.1 9.8 >7 B Elbow Wrist 3.5 0.0  >50  B Elbow    5.5  9.8  A Elbow B Elbow 2.3 0.0  >50  A Elbow    7.8  9.3          EMG   Side Muscle Ins Act Fibs Psw Fasc Number Recrt Dur Dur. Amp Amp. Poly Poly. Comment  Left 1stDorInt Nml Nml Nml Nml Nml Nml Nml Nml Nml Nml Nml Nml N/A  Left Abd Poll Brev Nml Nml Nml Nml Nml Nml Nml Nml Nml Nml Nml Nml N/A  Left Ext Indicis Nml Nml Nml Nml Nml Nml Nml Nml Nml Nml Nml Nml N/A  Left PronatorTeres Nml Nml Nml Nml Nml Nml Nml Nml Nml Nml Nml Nml N/A  Left Biceps Nml Nml Nml Nml Nml Nml Nml Nml Nml Nml Nml Nml N/A  Left Triceps Nml Nml Nml Nml Nml Nml Nml Nml Nml Nml Nml Nml N/A  Right Abd Poll Brev Nml Nml Nml Nml Nml Nml Nml Nml Nml Nml Nml Nml N/A  Right PronatorTeres Nml Nml Nml Nml Nml Nml Nml Nml Nml Nml Nml Nml N/A      Waveforms:

## 2014-01-28 ENCOUNTER — Other Ambulatory Visit: Payer: Self-pay | Admitting: Family Medicine

## 2014-01-28 ENCOUNTER — Telehealth: Payer: Self-pay | Admitting: Family Medicine

## 2014-01-28 DIAGNOSIS — G5600 Carpal tunnel syndrome, unspecified upper limb: Secondary | ICD-10-CM

## 2014-01-28 NOTE — Telephone Encounter (Signed)
Result discussed with patient.

## 2014-01-28 NOTE — Telephone Encounter (Signed)
Pt called and would like to know if her results from her carpal exam are in and if so can you call her with the results. jw

## 2014-02-02 ENCOUNTER — Telehealth: Payer: Self-pay | Admitting: Neurology

## 2014-02-02 NOTE — Telephone Encounter (Signed)
As requested, EMG/NCV results faxed to Attn: Keenan Bachelor # [phone] at [place]. Pt in office to be seen today / Sherri S.

## 2014-05-31 ENCOUNTER — Ambulatory Visit: Payer: Medicaid Other

## 2014-06-22 ENCOUNTER — Ambulatory Visit (INDEPENDENT_AMBULATORY_CARE_PROVIDER_SITE_OTHER): Payer: Medicaid Other | Admitting: Family Medicine

## 2014-06-22 ENCOUNTER — Encounter: Payer: Self-pay | Admitting: Family Medicine

## 2014-06-22 VITALS — BP 130/98 | HR 72 | Ht 67.0 in | Wt 242.0 lb

## 2014-06-22 DIAGNOSIS — R87810 Cervical high risk human papillomavirus (HPV) DNA test positive: Secondary | ICD-10-CM | POA: Diagnosis not present

## 2014-06-22 DIAGNOSIS — M25561 Pain in right knee: Secondary | ICD-10-CM

## 2014-06-22 DIAGNOSIS — M25562 Pain in left knee: Principal | ICD-10-CM

## 2014-06-22 DIAGNOSIS — M25569 Pain in unspecified knee: Secondary | ICD-10-CM | POA: Diagnosis not present

## 2014-06-22 DIAGNOSIS — G5600 Carpal tunnel syndrome, unspecified upper limb: Secondary | ICD-10-CM | POA: Diagnosis not present

## 2014-06-22 DIAGNOSIS — G5603 Carpal tunnel syndrome, bilateral upper limbs: Secondary | ICD-10-CM

## 2014-06-22 HISTORY — DX: Pain in right knee: M25.561

## 2014-06-22 NOTE — Patient Instructions (Signed)
Please return for PAP test in 1-2 wks. I will complete your disability forms once I get them.  Human Papillomavirus HPV stands for human papillomavirus. There are many different types of HPV. People of all ages and races can get an HPV infection. In females, some types of HPV can cause warts on the sex organs or anus. Some females never see any warts on the outside, yet still have the infection inside. The doctor will need to check the sex organs and do a Pap test to see there is an HPV infection. The only sign of infection may be a Pap test that is abnormal. A female who has HPV has a higher chance of getting cancer of the sex organs or anus. It is very rare, but some females can give their babies the HPV infection when the baby is being born. Some of these babies can grow warts on the voice box (vocal cords). Males with HPV have a higher chance of getting cancer of the penis or anus. A female may have HPV but not see any warts on his penis or anus. There is no test to find HPV in males, so even if warts are not seen, there may still be an infection. HOME CARE   Take medicines as told by your doctor.  Get needed Pap tests.  Keep follow-up exams.  Do not touch or scratch the warts.  Do not treat warts with medicines used for treating hand warts.  Tell your sex partner about your infection because he or she may also need treatment.  Do not have sex while you are getting treatment.  After treatment, use condoms during sex.  Use over-the-counter creams for itching as told by your doctor.  Use over-the-counter or prescription medicines for pain, discomfort or fever as told by your doctor.  Do not douche or use tampons during treatment of HPV. GET HELP RIGHT AWAY IF:  You have a temperature by mouth above 102 F (38.9 C), not controlled by medicine. MAKE SURE YOU:   Understand these instructions.  Will watch your condition.  Will get help right away if you are not doing well or get  worse. Document Released: 10/11/2008 Document Revised: 01/21/2012 Document Reviewed: 02/03/2014 [facility] [address] Patient Information 2015 [place], [place]. This information is not intended to replace advice given to you by your health care provider. Make sure you discuss any questions you have with your health care provider.

## 2014-06-22 NOTE — Assessment & Plan Note (Signed)
Has had improvement in the past on intraarticular steroid injection. I referred to Dr Percell Miller for further management at her request.

## 2014-06-22 NOTE — Assessment & Plan Note (Signed)
PAP reviewed and discussed with patient. At the time of exam she was 33 y/o. Recommendation is to repeat co-testing in 1 yr. She will f/u in 2 wks for repeat PAP.

## 2014-06-22 NOTE — Assessment & Plan Note (Signed)
S/P corrective surgery by Dr Raliegh Ip. Gradually recovering. She completed disability application, she is instructed to bring form to me for completion. Continue f/u with Dr Percell Miller. Pain control on Oxycodone.

## 2014-06-22 NOTE — Progress Notes (Signed)
Subjective:     Patient ID: Diane Harvey, female   DOB: 1981-07-30, 33 y.o.   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 She has difficulty in using her hands without any discomfort. There is associated numbness, tingling sensation with prolonged use of her hands. Patient is requesting disability application form completion. B/L Knee pain: On going for years on and off, pain is now about 10/10 in severity, uses Oxy as needed.Walking makes it worse, rest improves it. She got steroid knee injection in the past which helped, will like referral back to Dr Percell Miller. Cervical cancer screening: Had normal PAP with positive HPV in 2013, here for follow up.  Current Outpatient Prescriptions on File Prior to Visit  Medication Sig Dispense Refill  . Elastic Bandages & Supports (SPLINT WRIST BRACE/LEFT-RIGHT) MISC 1 each by Does not apply route at bedtime.  1 each  0   Current Facility-Administered Medications on File Prior to Visit  Medication Dose Route Frequency Provider Last Rate Last Dose  . medroxyPROGESTERone (DEPO-PROVERA) injection 150 mg  150 mg Intramuscular See admin instructions Katherina Mires, MD       Past Medical History  Diagnosis Date  . Knee pain 07/31/2011    See orthopedist for this.  Unknown name.  Has tried injections with good results.  Plans on returning.       Review of Systems  Constitutional: Negative.   Respiratory: Negative.   Cardiovascular: Negative.   Gastrointestinal: Negative.   Genitourinary: Negative.   Musculoskeletal:       B/L wrist and knee pain.  All other systems reviewed and are negative.  Filed  Vitals:   06/22/14 0910  BP: 130/98  Pulse: 72  Height: 5\' 7"  (1.702 m)  Weight: 242 lb (109.77 kg)        Objective:   Physical Exam  Nursing note and vitals reviewed. Constitutional: She appears well-developed. No distress.  Cardiovascular: Normal rate, regular rhythm and normal heart sounds.   No murmur heard. Pulmonary/Chest: Effort normal and breath sounds normal. No respiratory distress. She has no wheezes.  Abdominal: Soft. Bowel sounds are normal. She exhibits no distension and no mass. There is no tenderness.  Musculoskeletal:       Right wrist: She exhibits normal range of motion, no tenderness, no bony tenderness and no swelling.       Left wrist: She exhibits normal range of motion, no tenderness and no swelling.       Right knee: She exhibits normal range of motion and no swelling.       Left knee: She exhibits normal range of motion, no swelling and no effusion.  Wearing hand brace on the left.  Neurological:  Decreased sensation of right first  And second digit.       Assessment:     Carpal tunnel syndrome B/L Knee pain +HPV ( High grade)     Plan:     Check problem list.

## 2014-07-14 ENCOUNTER — Ambulatory Visit: Payer: Medicaid Other | Attending: Family Medicine | Admitting: Physical Therapy

## 2014-07-14 DIAGNOSIS — M25669 Stiffness of unspecified knee, not elsewhere classified: Secondary | ICD-10-CM | POA: Diagnosis not present

## 2014-07-14 DIAGNOSIS — M2550 Pain in unspecified joint: Secondary | ICD-10-CM | POA: Diagnosis not present

## 2014-07-14 DIAGNOSIS — IMO0001 Reserved for inherently not codable concepts without codable children: Secondary | ICD-10-CM | POA: Insufficient documentation

## 2014-07-21 ENCOUNTER — Telehealth: Payer: Self-pay | Admitting: Family Medicine

## 2014-07-21 ENCOUNTER — Other Ambulatory Visit: Payer: Self-pay | Admitting: Family Medicine

## 2014-07-21 MED ORDER — IBUPROFEN 600 MG PO TABS: 600.0000 mg | ORAL_TABLET | Freq: Three times a day (TID) | ORAL | Status: DC | PRN

## 2014-07-21 NOTE — Telephone Encounter (Signed)
I have send ibuprofen 600 mg prn to her pharmacy,please encourage her to follow up with hand surgeon who did her surgery.

## 2014-07-21 NOTE — Telephone Encounter (Signed)
Pt is requesting meds for pain that is resulting from carpal tunnel surgery. Pt states she is still having a lot of sharp pains, especially at night. Pls advise.

## 2014-07-22 NOTE — Telephone Encounter (Signed)
Unable to lm for patient to call back due to full box.  Please inform of message from MD when she calls back. Jazmin Hartsell,CMA

## 2014-08-05 ENCOUNTER — Ambulatory Visit: Payer: Medicaid Other

## 2014-08-06 ENCOUNTER — Ambulatory Visit: Payer: Medicaid Other

## 2014-08-06 DIAGNOSIS — IMO0001 Reserved for inherently not codable concepts without codable children: Secondary | ICD-10-CM | POA: Diagnosis not present

## 2014-08-10 ENCOUNTER — Encounter: Payer: Medicaid Other | Admitting: Physical Therapy

## 2014-09-13 ENCOUNTER — Encounter: Payer: Self-pay | Admitting: Family Medicine

## 2014-11-03 ENCOUNTER — Encounter: Payer: Self-pay | Admitting: Family Medicine

## 2014-11-03 ENCOUNTER — Ambulatory Visit (INDEPENDENT_AMBULATORY_CARE_PROVIDER_SITE_OTHER): Payer: Medicaid Other | Admitting: Family Medicine

## 2014-11-03 VITALS — BP 119/81 | HR 82 | Temp 98.2°F | Wt 243.0 lb

## 2014-11-03 DIAGNOSIS — Z3200 Encounter for pregnancy test, result unknown: Secondary | ICD-10-CM

## 2014-11-03 DIAGNOSIS — Z331 Pregnant state, incidental: Secondary | ICD-10-CM | POA: Insufficient documentation

## 2014-11-03 DIAGNOSIS — E669 Obesity, unspecified: Secondary | ICD-10-CM

## 2014-11-03 LAB — POCT URINE PREGNANCY: PREG TEST UR: POSITIVE

## 2014-11-03 MED ORDER — VITAMIN B-6 25 MG PO TABS: 25.0000 mg | ORAL_TABLET | Freq: Every day | ORAL | Status: DC

## 2014-11-03 NOTE — Progress Notes (Signed)
Subjective:     Patient ID: Diane Harvey, female   DOB: 08-31-1981, 34 y.o.   MRN: [id number]  HPI  Amenorrhea: LMP : end of Oct 2015. Usually regular but in the last few weeks has been spotting. No stomach pain, she feels nauseated but no vomiting. Depo stopped in May 2015 while planning to switch to something else. Home pregnancy test was positive twice last week. Here to confirm. This will be an unplanned pregnancy. She will like to keep baby. She is G4P3. No previous pregnancy complication. Prenatal care with [facility] midwife. Will like to go back to [facility]. Currently not spotting.  Current Outpatient Prescriptions on File Prior to Visit  Medication Sig Dispense Refill  . Elastic Bandages & Supports (SPLINT WRIST BRACE/LEFT-RIGHT) MISC 1 each by Does not apply route at bedtime. 1 each 0   Current Facility-Administered Medications on File Prior to Visit  Medication Dose Route Frequency Provider Last Rate Last Dose  . medroxyPROGESTERone (DEPO-PROVERA) injection 150 mg  150 mg Intramuscular See admin instructions Katherina Mires, MD       Past Medical History  Diagnosis Date  . Knee pain 07/31/2011    See orthopedist for this.  Unknown name.  Has tried injections with good results.  Plans on returning.       Review of Systems  Respiratory: Negative.   Cardiovascular: Negative.   Gastrointestinal: Negative.   Genitourinary: Positive for menstrual problem.  All other systems reviewed and are negative.  Filed Vitals:   11/03/14 1401  BP: 119/81  Pulse: 82  Temp: 98.2 F (36.8 C)  TempSrc: Oral  Weight: 243 lb (110.224 kg)       Objective:   Physical Exam  Constitutional: She appears well-developed. No distress.  Cardiovascular: Normal rate, regular rhythm and normal heart sounds.   No murmur heard. Pulmonary/Chest: Effort normal and breath sounds normal. No respiratory distress. She has no wheezes.  Abdominal: Soft. Bowel sounds are normal. She exhibits no distension and  no mass. There is no tenderness.  Nursing note and vitals reviewed.      Assessment:     Incidental pregnancy     Plan:     Check problem list.

## 2014-11-03 NOTE — Assessment & Plan Note (Signed)
Pregnancy test today was positive. Routine pregnancy counseling done. I advised she start prenatal vitamin immediately. She is to call [facility] this week or soon for prenatal care. Vitamin B6 recommended for 10 days to help with nausea. F/U as needed.

## 2014-11-03 NOTE — Patient Instructions (Addendum)
It was nice see you and congratulation on you pregnancy please call [facility] to schedule prenatal as soon as possible. Start prenatal vitamin as soon as possible. Nausea is common during pregnancy, you may try Vitamin B 6 to see if there will be an improvement.  First Trimester of Pregnancy The first trimester of pregnancy is from week 1 until the end of week 12 (months 1 through 3). During this time, your baby will begin to develop inside you. At 6-8 weeks, the eyes and face are formed, and the heartbeat can be seen on ultrasound. At the end of 12 weeks, all the baby's organs are formed. Prenatal care is all the medical care you receive before the birth of your baby. Make sure you get good prenatal care and follow all of your doctor's instructions. HOME CARE  Medicines  Take medicine only as told by your doctor. Some medicines are safe and some are not during pregnancy.  Take your prenatal vitamins as told by your doctor.  Take medicine that helps you poop (stool softener) as needed if your doctor says it is okay. Diet  Eat regular, healthy meals.  Your doctor will tell you the amount of weight gain that is right for you.  Avoid raw meat and uncooked cheese.  If you feel sick to your stomach (nauseous) or throw up (vomit):  Eat 4 or 5 small meals a day instead of 3 large meals.  Try eating a few soda crackers.  Drink liquids between meals instead of during meals.  If you have a hard time pooping (constipation):  Eat high-fiber foods like fresh vegetables, fruit, and whole grains.  Drink enough fluids to keep your pee (urine) clear or pale yellow. Activity and Exercise  Exercise only as told by your doctor. Stop exercising if you have cramps or pain in your lower belly (abdomen) or low back.  Try to avoid standing for long periods of time. Move your legs often if you must stand in one place for a long time.  Avoid heavy lifting.  Wear low-heeled shoes. Sit and stand  up straight.  You can have sex unless your doctor tells you not to. Relief of Pain or Discomfort  Wear a good support bra if your breasts are sore.  Take warm water baths (sitz baths) to soothe pain or discomfort caused by hemorrhoids. Use hemorrhoid cream if your doctor says it is okay.  Rest with your legs raised if you have leg cramps or low back pain.  Wear support hose if you have puffy, bulging veins (varicose veins) in your legs. Raise (elevate) your feet for 15 minutes, 3-4 times a day. Limit salt in your diet. Prenatal Care  Schedule your prenatal visits by the twelfth week of pregnancy.  Write down your questions. Take them to your prenatal visits.  Keep all your prenatal visits as told by your doctor. Safety  Wear your seat belt at all times when driving.  Make a list of emergency phone numbers. The list should include numbers for family, friends, the hospital, and police and fire departments. General Tips  Ask your doctor for a referral to a local prenatal class. Begin classes no later than at the start of month 6 of your pregnancy.  Ask for help if you need counseling or help with nutrition. Your doctor can give you advice or tell you where to go for help.  Do not use hot tubs, steam rooms, or saunas.  Do not douche or use  tampons or scented sanitary pads.  Do not cross your legs for long periods of time.  Avoid litter boxes and soil used by cats.  Avoid all smoking, herbs, and alcohol. Avoid drugs not approved by your doctor.  Visit your dentist. At home, brush your teeth with a soft toothbrush. Be gentle when you floss. GET HELP IF:  You are dizzy.  You have mild cramps or pressure in your lower belly.  You have a nagging pain in your belly area.  You continue to feel sick to your stomach, throw up, or have watery poop (diarrhea).  You have a bad smelling fluid coming from your vagina.  You have pain with peeing (urination).  You have increased  puffiness (swelling) in your face, hands, legs, or ankles. GET HELP RIGHT AWAY IF:   You have a fever.  You are leaking fluid from your vagina.  You have spotting or bleeding from your vagina.  You have very bad belly cramping or pain.  You gain or lose weight rapidly.  You throw up blood. It may look like coffee grounds.  You are around people who have [place] measles, fifth disease, or chickenpox.  You have a very bad headache.  You have shortness of breath.  You have any kind of trauma, such as from a fall or a car accident. Document Released: 04/16/2008 Document Revised: 03/15/2014 Document Reviewed: 09/08/2013 [facility] Patient Information 2015 [place], [place]. This information is not intended to replace advice given to you by your health care provider. Make sure you discuss any questions you have with your health care provider.

## 2014-11-08 ENCOUNTER — Encounter: Payer: Medicaid Other | Admitting: Neurology

## 2014-11-17 ENCOUNTER — Encounter: Payer: Self-pay | Admitting: Neurology

## 2014-11-17 ENCOUNTER — Ambulatory Visit (INDEPENDENT_AMBULATORY_CARE_PROVIDER_SITE_OTHER): Payer: Self-pay | Admitting: Neurology

## 2014-11-17 ENCOUNTER — Ambulatory Visit (INDEPENDENT_AMBULATORY_CARE_PROVIDER_SITE_OTHER): Payer: Medicaid Other | Admitting: Neurology

## 2014-11-17 DIAGNOSIS — G5603 Carpal tunnel syndrome, bilateral upper limbs: Secondary | ICD-10-CM

## 2014-11-17 DIAGNOSIS — G5601 Carpal tunnel syndrome, right upper limb: Secondary | ICD-10-CM

## 2014-11-17 DIAGNOSIS — G5602 Carpal tunnel syndrome, left upper limb: Secondary | ICD-10-CM

## 2014-11-17 DIAGNOSIS — R202 Paresthesia of skin: Secondary | ICD-10-CM

## 2014-11-17 NOTE — Progress Notes (Signed)
Please refer to EMG and nerve conduction study procedure note. 

## 2014-11-17 NOTE — Procedures (Signed)
HISTORY:  Diane Harvey is a 34 year old patient with a two-year history of some numbness in the hands, right greater than left. She has undergone nerve conduction studies in March 2015 that revealed bilateral carpal tunnel syndrome. She underwent carpal tunnel surgery on the left in April 2015, and on the right side in July 2015. Following the surgery, her symptoms seemed improved, but more recently, she has had some recurrence of symptoms involving the right hand more so than the left. She denies any neck pain or pain down the arms. She is being evaluated for a possible neuropathy or a cervical radiculopathy.  NERVE CONDUCTION STUDIES:  Nerve conduction studies were performed on both upper extremities. The distal motor latencies and motor amplitudes for the median and ulnar nerves were within normal limits. The F wave latencies and nerve conduction velocities for these nerves were also normal. The sensory latencies for the median and ulnar nerves were normal.   EMG STUDIES:  EMG study was performed on the right upper extremity:  The first dorsal interosseous muscle reveals 2 to 4 K units with full recruitment. No fibrillations or positive waves were noted. The abductor pollicis brevis muscle reveals 2 to 5 K units with decreased recruitment. No fibrillations or positive waves were noted. The extensor indicis proprius muscle reveals 1 to 3 K units with full recruitment. No fibrillations or positive waves were noted. The pronator teres muscle reveals 2 to 3 K units with full recruitment. No fibrillations or positive waves were noted. The biceps muscle reveals 1 to 2 K units with full recruitment. No fibrillations or positive waves were noted. The triceps muscle reveals 2 to 4 K units with full recruitment. No fibrillations or positive waves were noted. The anterior deltoid muscle reveals 2 to 3 K units with full recruitment. No fibrillations or positive waves were noted. The cervical  paraspinal muscles were tested at 2 levels. No abnormalities of insertional activity were seen at either level tested. There was fair relaxation.  EMG study was performed on the left upper extremity:  The first dorsal interosseous muscle reveals 2 to 4 K units with full recruitment. No fibrillations or positive waves were noted. The abductor pollicis brevis muscle reveals 2 to 4 K units with full recruitment. No fibrillations or positive waves were noted. The extensor indicis proprius muscle reveals 1 to 3 K units with full recruitment. No fibrillations or positive waves were noted. The pronator teres muscle reveals 2 to 3 K units with full recruitment. No fibrillations or positive waves were noted. The biceps muscle reveals 1 to 2 K units with full recruitment. No fibrillations or positive waves were noted. The triceps muscle reveals 2 to 4 K units with full recruitment. No fibrillations or positive waves were noted. The anterior deltoid muscle reveals 2 to 3 K units with full recruitment. No fibrillations or positive waves were noted. The cervical paraspinal muscles were tested at 2 levels. No abnormalities of insertional activity were seen at either level tested. There was good relaxation.   IMPRESSION:  Nerve conduction studies done on both upper extremities were within normal limits. There appears to be resolution of the carpal tunnel syndrome on both sides. EMG evaluation of the right upper extremity shows mild chronic stable signs of denervation of the APB muscle consistent with the prior history of carpal tunnel syndrome. No evidence of an overlying cervical radiculopathy is seen. EMG study of the left upper extremity is unremarkable, without evidence of a cervical radiculopathy.  Jill Alexanders MD 11/17/2014 10:59 AM  [facility] [address] [place], [place] [address]  Phone [phone] Fax [phone]

## 2014-12-28 ENCOUNTER — Other Ambulatory Visit: Payer: Medicaid Other

## 2014-12-28 DIAGNOSIS — Z331 Pregnant state, incidental: Secondary | ICD-10-CM

## 2014-12-28 NOTE — Progress Notes (Signed)
Labs drawn today by [employer].Diane Harvey, [name]

## 2014-12-29 LAB — OBSTETRIC PANEL
ANTIBODY SCREEN: NEGATIVE
BASOS ABS: 0 10*3/uL (ref 0.0–0.1)
BASOS PCT: 0 % (ref 0–1)
EOS ABS: 0.1 10*3/uL (ref 0.0–0.7)
Eosinophils Relative: 1 % (ref 0–5)
HCT: 37.7 % (ref 36.0–46.0)
Hemoglobin: 12.3 g/dL (ref 12.0–15.0)
Hepatitis B Surface Ag: NEGATIVE
Lymphocytes Relative: 18 % (ref 12–46)
Lymphs Abs: 1.7 10*3/uL (ref 0.7–4.0)
MCH: 25.8 pg — ABNORMAL LOW (ref 26.0–34.0)
MCHC: 32.6 g/dL (ref 30.0–36.0)
MCV: 79 fL (ref 78.0–100.0)
MONO ABS: 0.5 10*3/uL (ref 0.1–1.0)
MPV: 10.9 fL (ref 8.6–12.4)
Monocytes Relative: 5 % (ref 3–12)
NEUTROS ABS: 7.2 10*3/uL (ref 1.7–7.7)
Neutrophils Relative %: 76 % (ref 43–77)
PLATELETS: 361 10*3/uL (ref 150–400)
RBC: 4.77 MIL/uL (ref 3.87–5.11)
RDW: 15.7 % — AB (ref 11.5–15.5)
Rh Type: POSITIVE
Rubella: 2.16 Index — ABNORMAL HIGH (ref <0.90)
WBC: 9.5 10*3/uL (ref 4.0–10.5)

## 2014-12-29 LAB — SICKLE CELL SCREEN: Sickle Cell Screen: NEGATIVE

## 2014-12-29 LAB — HIV ANTIBODY (ROUTINE TESTING W REFLEX): HIV: NONREACTIVE

## 2014-12-30 LAB — CULTURE, OB URINE
Colony Count: NO GROWTH
ORGANISM ID, BACTERIA: NO GROWTH

## 2015-01-17 ENCOUNTER — Ambulatory Visit (INDEPENDENT_AMBULATORY_CARE_PROVIDER_SITE_OTHER): Payer: Medicaid Other | Admitting: Family Medicine

## 2015-01-17 ENCOUNTER — Encounter: Payer: Self-pay | Admitting: Family Medicine

## 2015-01-17 ENCOUNTER — Other Ambulatory Visit (HOSPITAL_COMMUNITY)
Admission: RE | Admit: 2015-01-17 | Discharge: 2015-01-17 | Disposition: A | Discharge status: Home / Self Care | Payer: Medicaid Other | Source: Ambulatory Visit | Attending: Family Medicine | Admitting: Family Medicine

## 2015-01-17 VITALS — BP 134/90 | HR 107 | Temp 98.0°F | Wt 245.5 lb

## 2015-01-17 DIAGNOSIS — O0932 Supervision of pregnancy with insufficient antenatal care, second trimester: Secondary | ICD-10-CM

## 2015-01-17 DIAGNOSIS — G5603 Carpal tunnel syndrome, bilateral upper limbs: Secondary | ICD-10-CM

## 2015-01-17 DIAGNOSIS — G5601 Carpal tunnel syndrome, right upper limb: Secondary | ICD-10-CM

## 2015-01-17 DIAGNOSIS — G5602 Carpal tunnel syndrome, left upper limb: Secondary | ICD-10-CM

## 2015-01-17 DIAGNOSIS — E669 Obesity, unspecified: Secondary | ICD-10-CM

## 2015-01-17 DIAGNOSIS — Z113 Encounter for screening for infections with a predominantly sexual mode of transmission: Secondary | ICD-10-CM | POA: Diagnosis not present

## 2015-01-17 DIAGNOSIS — O0930 Supervision of pregnancy with insufficient antenatal care, unspecified trimester: Secondary | ICD-10-CM | POA: Insufficient documentation

## 2015-01-17 NOTE — Patient Instructions (Signed)
Second Trimester of Pregnancy The second trimester is from week 13 through week 28, months 4 through 6. The second trimester is often a time when you feel your best. Your body has also adjusted to being pregnant, and you begin to feel better physically. Usually, morning sickness has lessened or quit completely, you may have more energy, and you may have an increase in appetite. The second trimester is also a time when the fetus is growing rapidly. At the end of the sixth month, the fetus is about 9 inches long and weighs about 1 pounds. You will likely begin to feel the baby move (quickening) between 18 and 20 weeks of the pregnancy. BODY CHANGES Your body goes through many changes during pregnancy. The changes vary from woman to woman.   Your weight will continue to increase. You will notice your lower abdomen bulging out.  You may begin to get stretch marks on your hips, abdomen, and breasts.  You may develop headaches that can be relieved by medicines approved by your health care provider.  You may urinate more often because the fetus is pressing on your bladder.  You may develop or continue to have heartburn as a result of your pregnancy.  You may develop constipation because certain hormones are causing the muscles that push waste through your intestines to slow down.  You may develop hemorrhoids or swollen, bulging veins (varicose veins).  You may have back pain because of the weight gain and pregnancy hormones relaxing your joints between the bones in your pelvis and as a result of a shift in weight and the muscles that support your balance.  Your breasts will continue to grow and be tender.  Your gums may bleed and may be sensitive to brushing and flossing.  Dark spots or blotches (chloasma, mask of pregnancy) may develop on your face. This will likely fade after the baby is born.  A dark line from your belly button to the pubic area (linea nigra) may appear. This will likely fade  after the baby is born.  You may have changes in your hair. These can include thickening of your hair, rapid growth, and changes in texture. Some women also have hair loss during or after pregnancy, or hair that feels dry or thin. Your hair will most likely return to normal after your baby is born. WHAT TO EXPECT AT YOUR PRENATAL VISITS During a routine prenatal visit:  You will be weighed to make sure you and the fetus are growing normally.  Your blood pressure will be taken.  Your abdomen will be measured to track your baby's growth.  The fetal heartbeat will be listened to.  Any test results from the previous visit will be discussed. Your health care provider may ask you:  How you are feeling.  If you are feeling the baby move.  If you have had any abnormal symptoms, such as leaking fluid, bleeding, severe headaches, or abdominal cramping.  If you have any questions. Other tests that may be performed during your second trimester include:  Blood tests that check for:  Low iron levels (anemia).  Gestational diabetes (between 24 and 28 weeks).  Rh antibodies.  Urine tests to check for infections, diabetes, or protein in the urine.  An ultrasound to confirm the proper growth and development of the baby.  An amniocentesis to check for possible genetic problems.  Fetal screens for spina bifida and Down syndrome. HOME CARE INSTRUCTIONS   Avoid all smoking, herbs, alcohol, and unprescribed   drugs. These chemicals affect the formation and growth of the baby.  Follow your health care provider's instructions regarding medicine use. There are medicines that are either safe or unsafe to take during pregnancy.  Exercise only as directed by your health care provider. Experiencing uterine cramps is a good sign to stop exercising.  Continue to eat regular, healthy meals.  Wear a good support bra for breast tenderness.  Do not use hot tubs, steam rooms, or saunas.  Wear your  seat belt at all times when driving.  Avoid raw meat, uncooked cheese, cat litter boxes, and soil used by cats. These carry germs that can cause birth defects in the baby.  Take your prenatal vitamins.  Try taking a stool softener (if your health care provider approves) if you develop constipation. Eat more high-fiber foods, such as fresh vegetables or fruit and whole grains. Drink plenty of fluids to keep your urine clear or pale yellow.  Take warm sitz baths to soothe any pain or discomfort caused by hemorrhoids. Use hemorrhoid cream if your health care provider approves.  If you develop varicose veins, wear support hose. Elevate your feet for 15 minutes, 3-4 times a day. Limit salt in your diet.  Avoid heavy lifting, wear low heel shoes, and practice good posture.  Rest with your legs elevated if you have leg cramps or low back pain.  Visit your dentist if you have not gone yet during your pregnancy. Use a soft toothbrush to brush your teeth and be gentle when you floss.  A sexual relationship may be continued unless your health care provider directs you otherwise.  Continue to go to all your prenatal visits as directed by your health care provider. SEEK MEDICAL CARE IF:   You have dizziness.  You have mild pelvic cramps, pelvic pressure, or nagging pain in the abdominal area.  You have persistent nausea, vomiting, or diarrhea.  You have a bad smelling vaginal discharge.  You have pain with urination. SEEK IMMEDIATE MEDICAL CARE IF:   You have a fever.  You are leaking fluid from your vagina.  You have spotting or bleeding from your vagina.  You have severe abdominal cramping or pain.  You have rapid weight gain or loss.  You have shortness of breath with chest pain.  You notice sudden or extreme swelling of your face, hands, ankles, feet, or legs.  You have not felt your baby move in over an hour.  You have severe headaches that do not go away with  medicine.  You have vision changes. Document Released: 10/23/2001 Document Revised: 11/03/2013 Document Reviewed: 12/30/2012 ExitCare Patient Information 2015 [employer]. This information is not intended to replace advice given to you by your health care provider. Make sure you discuss any questions you have with your health care provider.  

## 2015-01-17 NOTE — Progress Notes (Signed)
Subjective Has a history of carpal tunnel of both hands. Has had two surgeries. Symptoms restarted two months ago. She has been using tylenol and her brace. No vaginal discharge, bleeding or odors. No contractions. Has fetal movement. Family history of diabetes in mother and father  Objective BP 134/90 mmHg  Pulse 107  Temp(Src) 98 F (36.7 C)  Wt 245 lb 8 oz (111.358 kg)  LMP 08/31/2014 (Exact Date) General: Obese, well appearing, no distress Cardio: Regular rate and rhythm, no murmurs Chest: Clear to auscultation bilaterally Abdomen: obese, non-tender, non-distended, gravid, bowel sounds present Extremities: no edema, 2+ distal pulses bilaterally  Assessment/Plan: Late to prenatal care, second trimester Advanced maternal age Risk for gestational diabetes Carpal tunnel  Patient declined genetics referral  Quad screen  Reschedule for 1 hour glucola  GC/chlamydia  Anatomy U/S and for dates  Cock-up splint qHS and Tylenol 500mg  q6hrs PRN  Follow-up in 4 weeks

## 2015-01-18 LAB — URINE CYTOLOGY ANCILLARY ONLY
CHLAMYDIA, DNA PROBE: NEGATIVE
NEISSERIA GONORRHEA: NEGATIVE

## 2015-01-24 ENCOUNTER — Telehealth: Payer: Self-pay | Admitting: *Deleted

## 2015-01-24 NOTE — Telephone Encounter (Signed)
-----   Message from Mariel Aloe, MD sent at 01/21/2015  8:27 PM EST ----- Please inform patient that GC and Chlamydia are negative

## 2015-01-24 NOTE — Telephone Encounter (Signed)
Spoke with patient and informed her of below lab results

## 2015-01-25 ENCOUNTER — Ambulatory Visit (HOSPITAL_COMMUNITY)
Admission: RE | Admit: 2015-01-25 | Discharge: 2015-01-25 | Disposition: A | Discharge status: Home / Self Care | Payer: Medicaid Other | Source: Ambulatory Visit | Attending: Family Medicine | Admitting: Family Medicine

## 2015-01-25 DIAGNOSIS — Z3689 Encounter for other specified antenatal screening: Secondary | ICD-10-CM | POA: Insufficient documentation

## 2015-01-25 DIAGNOSIS — O0932 Supervision of pregnancy with insufficient antenatal care, second trimester: Secondary | ICD-10-CM | POA: Diagnosis not present

## 2015-01-25 DIAGNOSIS — Z3A21 21 weeks gestation of pregnancy: Secondary | ICD-10-CM | POA: Insufficient documentation

## 2015-01-25 DIAGNOSIS — O09529 Supervision of elderly multigravida, unspecified trimester: Secondary | ICD-10-CM | POA: Insufficient documentation

## 2015-01-26 ENCOUNTER — Other Ambulatory Visit (INDEPENDENT_AMBULATORY_CARE_PROVIDER_SITE_OTHER): Payer: Medicaid Other

## 2015-01-26 DIAGNOSIS — Z3A21 21 weeks gestation of pregnancy: Secondary | ICD-10-CM

## 2015-01-26 LAB — GLUCOSE, CAPILLARY
Comment 1: 1
Glucose-Capillary: 125 mg/dL — ABNORMAL HIGH (ref 70–99)

## 2015-01-26 NOTE — Progress Notes (Signed)
Patient here for a 1 HR GTT only today. She passed with a result of 125 so a 3 HR is not indicated.Busick, [name]

## 2015-01-31 NOTE — Progress Notes (Signed)
I was the preceptor for this encounter. Lillianna Sabel, M.D. 

## 2015-02-10 ENCOUNTER — Encounter: Payer: Self-pay | Admitting: Family Medicine

## 2015-02-10 ENCOUNTER — Telehealth: Payer: Self-pay | Admitting: Family Medicine

## 2015-02-10 NOTE — Telephone Encounter (Signed)
Pt is calling because she needs a letter faxed over to her dentist to be seen for a general cleaning. Please fax this too [phone]. jw

## 2015-02-10 NOTE — Telephone Encounter (Signed)
Pt said that she needs this letter to say it is ok for her to be seen since she is pregnant. Diane Harvey,CMA

## 2015-02-10 NOTE — Telephone Encounter (Signed)
Why does she need the letter?

## 2015-02-10 NOTE — Telephone Encounter (Signed)
Please inform patient letter is ready for pick up.

## 2015-02-10 NOTE — Telephone Encounter (Signed)
What should be stated in the letter to her Dentist?

## 2015-02-10 NOTE — Telephone Encounter (Signed)
Letter faxed to dentist office. Yoon Barca,CMA

## 2015-02-11 ENCOUNTER — Encounter: Payer: Self-pay | Admitting: Family Medicine

## 2015-02-18 ENCOUNTER — Ambulatory Visit (INDEPENDENT_AMBULATORY_CARE_PROVIDER_SITE_OTHER): Payer: Medicaid Other | Admitting: Family Medicine

## 2015-02-18 ENCOUNTER — Encounter: Payer: Self-pay | Admitting: Family Medicine

## 2015-02-18 VITALS — BP 117/77 | HR 91 | Temp 98.3°F | Wt 244.6 lb

## 2015-02-18 DIAGNOSIS — O0932 Supervision of pregnancy with insufficient antenatal care, second trimester: Secondary | ICD-10-CM

## 2015-02-18 NOTE — Patient Instructions (Signed)
Second Trimester of Pregnancy The second trimester is from week 13 through week 28, months 4 through 6. The second trimester is often a time when you feel your best. Your body has also adjusted to being pregnant, and you begin to feel better physically. Usually, morning sickness has lessened or quit completely, you may have more energy, and you may have an increase in appetite. The second trimester is also a time when the fetus is growing rapidly. At the end of the sixth month, the fetus is about 9 inches long and weighs about 1 pounds. You will likely begin to feel the baby move (quickening) between 18 and 20 weeks of the pregnancy. BODY CHANGES Your body goes through many changes during pregnancy. The changes vary from woman to woman.   Your weight will continue to increase. You will notice your lower abdomen bulging out.  You may begin to get stretch marks on your hips, abdomen, and breasts.  You may develop headaches that can be relieved by medicines approved by your health care provider.  You may urinate more often because the fetus is pressing on your bladder.  You may develop or continue to have heartburn as a result of your pregnancy.  You may develop constipation because certain hormones are causing the muscles that push waste through your intestines to slow down.  You may develop hemorrhoids or swollen, bulging veins (varicose veins).  You may have back pain because of the weight gain and pregnancy hormones relaxing your joints between the bones in your pelvis and as a result of a shift in weight and the muscles that support your balance.  Your breasts will continue to grow and be tender.  Your gums may bleed and may be sensitive to brushing and flossing.  Dark spots or blotches (chloasma, mask of pregnancy) may develop on your face. This will likely fade after the baby is born.  A dark line from your belly button to the pubic area (linea nigra) may appear. This will likely fade  after the baby is born.  You may have changes in your hair. These can include thickening of your hair, rapid growth, and changes in texture. Some women also have hair loss during or after pregnancy, or hair that feels dry or thin. Your hair will most likely return to normal after your baby is born. WHAT TO EXPECT AT YOUR PRENATAL VISITS During a routine prenatal visit:  You will be weighed to make sure you and the fetus are growing normally.  Your blood pressure will be taken.  Your abdomen will be measured to track your baby's growth.  The fetal heartbeat will be listened to.  Any test results from the previous visit will be discussed. Your health care provider may ask you:  How you are feeling.  If you are feeling the baby move.  If you have had any abnormal symptoms, such as leaking fluid, bleeding, severe headaches, or abdominal cramping.  If you have any questions. Other tests that may be performed during your second trimester include:  Blood tests that check for:  Low iron levels (anemia).  Gestational diabetes (between 24 and 28 weeks).  Rh antibodies.  Urine tests to check for infections, diabetes, or protein in the urine.  An ultrasound to confirm the proper growth and development of the baby.  An amniocentesis to check for possible genetic problems.  Fetal screens for spina bifida and Down syndrome. HOME CARE INSTRUCTIONS   Avoid all smoking, herbs, alcohol, and unprescribed   drugs. These chemicals affect the formation and growth of the baby.  Follow your health care provider's instructions regarding medicine use. There are medicines that are either safe or unsafe to take during pregnancy.  Exercise only as directed by your health care provider. Experiencing uterine cramps is a good sign to stop exercising.  Continue to eat regular, healthy meals.  Wear a good support bra for breast tenderness.  Do not use hot tubs, steam rooms, or saunas.  Wear your  seat belt at all times when driving.  Avoid raw meat, uncooked cheese, cat litter boxes, and soil used by cats. These carry germs that can cause birth defects in the baby.  Take your prenatal vitamins.  Try taking a stool softener (if your health care provider approves) if you develop constipation. Eat more high-fiber foods, such as fresh vegetables or fruit and whole grains. Drink plenty of fluids to keep your urine clear or pale yellow.  Take warm sitz baths to soothe any pain or discomfort caused by hemorrhoids. Use hemorrhoid cream if your health care provider approves.  If you develop varicose veins, wear support hose. Elevate your feet for 15 minutes, 3-4 times a day. Limit salt in your diet.  Avoid heavy lifting, wear low heel shoes, and practice good posture.  Rest with your legs elevated if you have leg cramps or low back pain.  Visit your dentist if you have not gone yet during your pregnancy. Use a soft toothbrush to brush your teeth and be gentle when you floss.  A sexual relationship may be continued unless your health care provider directs you otherwise.  Continue to go to all your prenatal visits as directed by your health care provider. SEEK MEDICAL CARE IF:   You have dizziness.  You have mild pelvic cramps, pelvic pressure, or nagging pain in the abdominal area.  You have persistent nausea, vomiting, or diarrhea.  You have a bad smelling vaginal discharge.  You have pain with urination. SEEK IMMEDIATE MEDICAL CARE IF:   You have a fever.  You are leaking fluid from your vagina.  You have spotting or bleeding from your vagina.  You have severe abdominal cramping or pain.  You have rapid weight gain or loss.  You have shortness of breath with chest pain.  You notice sudden or extreme swelling of your face, hands, ankles, feet, or legs.  You have not felt your baby move in over an hour.  You have severe headaches that do not go away with  medicine.  You have vision changes. Document Released: 10/23/2001 Document Revised: 11/03/2013 Document Reviewed: 12/30/2012 ExitCare Patient Information 2015 [employer]. This information is not intended to replace advice given to you by your health care provider. Make sure you discuss any questions you have with your health care provider.  

## 2015-02-20 NOTE — Progress Notes (Signed)
Diane Harvey is a 34 y.o. yo [phone] at [redacted]w[redacted]d who presents for her routine prenatal visit. She reports no symptoms. She  isTaking PNV. See flow sheet for details.  PMH, POBH, FH, meds, allergies and Social Hx reviewed.  O: See flow sheet for details  Assessment/plan: 1) Pregnancy [redacted]w[redacted]d doing well.  Current pregnancy issues include None Dating is reliable Bleeding and pain precautions reviewed. Premature labor precautions reviewed Importance of prenatal vitamins reviewed.  Early glucola is indicated and normal   Follow up 4 weeks in OB clinic

## 2015-02-21 ENCOUNTER — Telehealth: Payer: Self-pay

## 2015-02-22 NOTE — Progress Notes (Signed)
I was preceptor the day of this visit.   

## 2015-02-23 ENCOUNTER — Ambulatory Visit (HOSPITAL_COMMUNITY)
Admission: RE | Admit: 2015-02-23 | Discharge: 2015-02-23 | Disposition: A | Discharge status: Home / Self Care | Payer: Medicaid Other | Source: Ambulatory Visit | Attending: Family Medicine | Admitting: Family Medicine

## 2015-02-23 DIAGNOSIS — IMO0002 Reserved for concepts with insufficient information to code with codable children: Secondary | ICD-10-CM | POA: Insufficient documentation

## 2015-02-23 DIAGNOSIS — O09522 Supervision of elderly multigravida, second trimester: Secondary | ICD-10-CM | POA: Insufficient documentation

## 2015-02-23 DIAGNOSIS — Z36 Encounter for antenatal screening of mother: Secondary | ICD-10-CM | POA: Insufficient documentation

## 2015-02-23 DIAGNOSIS — O0932 Supervision of pregnancy with insufficient antenatal care, second trimester: Secondary | ICD-10-CM | POA: Insufficient documentation

## 2015-02-23 DIAGNOSIS — Z3A25 25 weeks gestation of pregnancy: Secondary | ICD-10-CM | POA: Insufficient documentation

## 2015-02-23 DIAGNOSIS — Z0489 Encounter for examination and observation for other specified reasons: Secondary | ICD-10-CM | POA: Insufficient documentation

## 2015-04-07 ENCOUNTER — Ambulatory Visit (INDEPENDENT_AMBULATORY_CARE_PROVIDER_SITE_OTHER): Payer: Medicaid Other | Admitting: Family Medicine

## 2015-04-07 ENCOUNTER — Encounter: Payer: Self-pay | Admitting: Family Medicine

## 2015-04-07 VITALS — BP 121/73 | HR 89 | Temp 98.3°F | Wt 247.0 lb

## 2015-04-07 DIAGNOSIS — Z3483 Encounter for supervision of other normal pregnancy, third trimester: Secondary | ICD-10-CM | POA: Diagnosis not present

## 2015-04-07 DIAGNOSIS — Z23 Encounter for immunization: Secondary | ICD-10-CM

## 2015-04-07 DIAGNOSIS — Z3480 Encounter for supervision of other normal pregnancy, unspecified trimester: Secondary | ICD-10-CM | POA: Insufficient documentation

## 2015-04-07 HISTORY — DX: Encounter for supervision of other normal pregnancy, unspecified trimester: Z34.80

## 2015-04-07 LAB — CBC
HCT: 34.3 % — ABNORMAL LOW (ref 36.0–46.0)
Hemoglobin: 10.7 g/dL — ABNORMAL LOW (ref 12.0–15.0)
MCH: 23.2 pg — ABNORMAL LOW (ref 26.0–34.0)
MCHC: 31.2 g/dL (ref 30.0–36.0)
MCV: 74.2 fL — AB (ref 78.0–100.0)
MPV: 10.4 fL (ref 8.6–12.4)
PLATELETS: 348 10*3/uL (ref 150–400)
RBC: 4.62 MIL/uL (ref 3.87–5.11)
RDW: 15.7 % — AB (ref 11.5–15.5)
WBC: 6.7 10*3/uL (ref 4.0–10.5)

## 2015-04-07 LAB — GLUCOSE, CAPILLARY
Comment 1: 1
Glucose-Capillary: 131 mg/dL — ABNORMAL HIGH (ref 65–99)

## 2015-04-07 NOTE — Patient Instructions (Signed)
Third Trimester of Pregnancy The third trimester is from week 29 through week 42, months 7 through 9. The third trimester is a time when the fetus is growing rapidly. At the end of the ninth month, the fetus is about 20 inches in length and weighs 6-10 pounds.  BODY CHANGES Your body goes through many changes during pregnancy. The changes vary from woman to woman.   Your weight will continue to increase. You can expect to gain 25-35 pounds (11-16 kg) by the end of the pregnancy.  You may begin to get stretch marks on your hips, abdomen, and breasts.  You may urinate more often because the fetus is moving lower into your pelvis and pressing on your bladder.  You may develop or continue to have heartburn as a result of your pregnancy.  You may develop constipation because certain hormones are causing the muscles that push waste through your intestines to slow down.  You may develop hemorrhoids or swollen, bulging veins (varicose veins).  You may have pelvic pain because of the weight gain and pregnancy hormones relaxing your joints between the bones in your pelvis. Backaches may result from overexertion of the muscles supporting your posture.  You may have changes in your hair. These can include thickening of your hair, rapid growth, and changes in texture. Some women also have hair loss during or after pregnancy, or hair that feels dry or thin. Your hair will most likely return to normal after your baby is born.  Your breasts will continue to grow and be tender. A yellow discharge may leak from your breasts called colostrum.  Your belly button may stick out.  You may feel short of breath because of your expanding uterus.  You may notice the fetus "dropping," or moving lower in your abdomen.  You may have a bloody mucus discharge. This usually occurs a few days to a week before labor begins.  Your cervix becomes thin and soft (effaced) near your due date. WHAT TO EXPECT AT YOUR PRENATAL  EXAMS  You will have prenatal exams every 2 weeks until week 36. Then, you will have weekly prenatal exams. During a routine prenatal visit:  You will be weighed to make sure you and the fetus are growing normally.  Your blood pressure is taken.  Your abdomen will be measured to track your baby's growth.  The fetal heartbeat will be listened to.  Any test results from the previous visit will be discussed.  You may have a cervical check near your due date to see if you have effaced. At around 36 weeks, your caregiver will check your cervix. At the same time, your caregiver will also perform a test on the secretions of the vaginal tissue. This test is to determine if a type of bacteria, Group B streptococcus, is present. Your caregiver will explain this further. Your caregiver may ask you:  What your birth plan is.  How you are feeling.  If you are feeling the baby move.  If you have had any abnormal symptoms, such as leaking fluid, bleeding, severe headaches, or abdominal cramping.  If you have any questions. Other tests or screenings that may be performed during your third trimester include:  Blood tests that check for low iron levels (anemia).  Fetal testing to check the health, activity level, and growth of the fetus. Testing is done if you have certain medical conditions or if there are problems during the pregnancy. FALSE LABOR You may feel small, irregular contractions that   eventually go away. These are called Braxton Hicks contractions, or false labor. Contractions may last for hours, days, or even weeks before true labor sets in. If contractions come at regular intervals, intensify, or become painful, it is best to be seen by your caregiver.  SIGNS OF LABOR   Menstrual-like cramps.  Contractions that are 5 minutes apart or less.  Contractions that start on the top of the uterus and spread down to the lower abdomen and back.  A sense of increased pelvic pressure or back  pain.  A watery or bloody mucus discharge that comes from the vagina. If you have any of these signs before the 37th week of pregnancy, call your caregiver right away. You need to go to the hospital to get checked immediately. HOME CARE INSTRUCTIONS   Avoid all smoking, herbs, alcohol, and unprescribed drugs. These chemicals affect the formation and growth of the baby.  Follow your caregiver's instructions regarding medicine use. There are medicines that are either safe or unsafe to take during pregnancy.  Exercise only as directed by your caregiver. Experiencing uterine cramps is a good sign to stop exercising.  Continue to eat regular, healthy meals.  Wear a good support bra for breast tenderness.  Do not use hot tubs, steam rooms, or saunas.  Wear your seat belt at all times when driving.  Avoid raw meat, uncooked cheese, cat litter boxes, and soil used by cats. These carry germs that can cause birth defects in the baby.  Take your prenatal vitamins.  Try taking a stool softener (if your caregiver approves) if you develop constipation. Eat more high-fiber foods, such as fresh vegetables or fruit and whole grains. Drink plenty of fluids to keep your urine clear or pale yellow.  Take warm sitz baths to soothe any pain or discomfort caused by hemorrhoids. Use hemorrhoid cream if your caregiver approves.  If you develop varicose veins, wear support hose. Elevate your feet for 15 minutes, 3-4 times a day. Limit salt in your diet.  Avoid heavy lifting, wear low heal shoes, and practice good posture.  Rest a lot with your legs elevated if you have leg cramps or low back pain.  Visit your dentist if you have not gone during your pregnancy. Use a soft toothbrush to brush your teeth and be gentle when you floss.  A sexual relationship may be continued unless your caregiver directs you otherwise.  Do not travel far distances unless it is absolutely necessary and only with the approval  of your caregiver.  Take prenatal classes to understand, practice, and ask questions about the labor and delivery.  Make a trial run to the hospital.  Pack your hospital bag.  Prepare the baby's nursery.  Continue to go to all your prenatal visits as directed by your caregiver. SEEK MEDICAL CARE IF:  You are unsure if you are in labor or if your water has broken.  You have dizziness.  You have mild pelvic cramps, pelvic pressure, or nagging pain in your abdominal area.  You have persistent nausea, vomiting, or diarrhea.  You have a bad smelling vaginal discharge.  You have pain with urination. SEEK IMMEDIATE MEDICAL CARE IF:   You have a fever.  You are leaking fluid from your vagina.  You have spotting or bleeding from your vagina.  You have severe abdominal cramping or pain.  You have rapid weight loss or gain.  You have shortness of breath with chest pain.  You notice sudden or extreme swelling   of your face, hands, ankles, feet, or legs.  You have not felt your baby move in over an hour.  You have severe headaches that do not go away with medicine.  You have vision changes. Document Released: 10/23/2001 Document Revised: 11/03/2013 Document Reviewed: 12/30/2012 ExitCare Patient Information 2015 [employer]. This information is not intended to replace advice given to you by your health care provider. Make sure you discuss any questions you have with your health care provider.  

## 2015-04-07 NOTE — Addendum Note (Signed)
Addended by: Levert Feinstein F on: 04/07/2015 09:24 AM   Modules accepted: Orders

## 2015-04-07 NOTE — Progress Notes (Signed)
Diane Harvey is a 34 y.o. [phone] at [redacted]w[redacted]d for routine follow up.  She reports good FM, no bleeding, LOF, ctx.  See flow sheet for details.  A/P: Pregnancy at [redacted]w[redacted]d.  Doing well.   Pregnancy issues include late pnc, AMA  Infant feeding choice bottle Contraception choice BTL. Consent signed today. Infant circumcision desired not applicable  Tdapwas given today. 1 hour glucola, CBC, RPR, and HIV were done today.   Pregnancy medical home forms were not done today and reviewed.   RH status was reviewed and pt does not need Rhogam.  Rhogam was not given today.   Preterm labor precautions reviewed. Kick counts reviewed. Follow up 2 weeks.

## 2015-04-08 LAB — RPR

## 2015-04-08 LAB — HIV ANTIBODY (ROUTINE TESTING W REFLEX): HIV: NONREACTIVE

## 2015-04-12 ENCOUNTER — Telehealth: Payer: Self-pay | Admitting: Family Medicine

## 2015-04-12 NOTE — Telephone Encounter (Signed)
I again reviewed patient's 1 hr Glucola result which was 131. Ideally our cut off for 3 hr glucola is 135 or greater but since she is AMA and mildly obese, I will feel comfortable proceeding with 3 hr glucola test. I called patient multiple times on both her home and cell phone and left a message for her to call us back to discuss this issue. I also discussed with Horris Latino who agreed to call her and set up appointment for 3 hr Glucola.  She has prenatal appointment with Dr Lajuana Ripple on the 10th of June, I will copy her on this note so she is aware as well.  Detail information about result will be given to patient if she calls back otherwise she will be contacted by Horris Latino.

## 2015-04-13 ENCOUNTER — Ambulatory Visit (HOSPITAL_COMMUNITY): Payer: Medicaid Other

## 2015-04-13 ENCOUNTER — Ambulatory Visit (HOSPITAL_COMMUNITY)
Admission: RE | Admit: 2015-04-13 | Discharge: 2015-04-13 | Disposition: A | Discharge status: Home / Self Care | Payer: Medicaid Other | Source: Ambulatory Visit | Attending: Family Medicine | Admitting: Family Medicine

## 2015-04-13 DIAGNOSIS — Z3A32 32 weeks gestation of pregnancy: Secondary | ICD-10-CM | POA: Diagnosis not present

## 2015-04-13 DIAGNOSIS — Z3483 Encounter for supervision of other normal pregnancy, third trimester: Secondary | ICD-10-CM

## 2015-04-13 DIAGNOSIS — O26843 Uterine size-date discrepancy, third trimester: Secondary | ICD-10-CM | POA: Diagnosis not present

## 2015-04-13 DIAGNOSIS — O09523 Supervision of elderly multigravida, third trimester: Secondary | ICD-10-CM | POA: Diagnosis not present

## 2015-04-13 DIAGNOSIS — O0933 Supervision of pregnancy with insufficient antenatal care, third trimester: Secondary | ICD-10-CM | POA: Insufficient documentation

## 2015-04-13 NOTE — Telephone Encounter (Signed)
1 hr Glucola result discussed with patient, I recommended 3 hr glucola testing. She agreed with this. I gave her Sherlene Shams number to call for lab appointment. I will follow up report once available.

## 2015-04-20 ENCOUNTER — Other Ambulatory Visit (INDEPENDENT_AMBULATORY_CARE_PROVIDER_SITE_OTHER): Payer: Medicaid Other

## 2015-04-20 DIAGNOSIS — Z331 Pregnant state, incidental: Secondary | ICD-10-CM

## 2015-04-20 LAB — GLUCOSE, CAPILLARY: GLUCOSE-CAPILLARY: 85 mg/dL (ref 65–99)

## 2015-04-20 NOTE — Progress Notes (Signed)
3 HR GTT DONE TODAY Diane Harvey 

## 2015-04-21 LAB — GLUCOSE TOLERANCE, 3 HOURS
GLUCOSE, 1 HOUR-GESTATIONAL: 156 mg/dL (ref 70–189)
GLUCOSE, FASTING-GESTATIONAL: 85 mg/dL (ref 70–104)
Glucose Tolerance, 2 hour: 122 mg/dL (ref 70–164)
Glucose, GTT - 3 Hour: 92 mg/dL (ref 70–144)

## 2015-04-22 ENCOUNTER — Ambulatory Visit (INDEPENDENT_AMBULATORY_CARE_PROVIDER_SITE_OTHER): Payer: Medicaid Other | Admitting: Family Medicine

## 2015-04-22 VITALS — BP 108/88 | HR 96 | Temp 98.3°F | Wt 249.0 lb

## 2015-04-22 DIAGNOSIS — Z3483 Encounter for supervision of other normal pregnancy, third trimester: Secondary | ICD-10-CM

## 2015-04-22 NOTE — Patient Instructions (Signed)
It was a pleasure seeing you today, Diane Harvey!  Information regarding what we discussed is included in this packet.  Please make an appointment to be seen for prenatal care in 2 weeks.  Continue to stay well hydrated.  Take your prenatal vitamin daily.    Please feel free to call our office at [phone] if any questions or concerns arise.  Warm Regards, Fayth Trefry M. Kaleia Longhi, DO  Third Trimester of Pregnancy The third trimester is from week 29 through week 42, months 7 through 9. This trimester is when your unborn baby (fetus) is growing very fast. At the end of the ninth month, the unborn baby is about 20 inches in length. It weighs about 6-10 pounds.  HOME CARE   Avoid all smoking, herbs, and alcohol. Avoid drugs not approved by your doctor.  Only take medicine as told by your doctor. Some medicines are safe and some are not during pregnancy.  Exercise only as told by your doctor. Stop exercising if you start having cramps.  Eat regular, healthy meals.  Wear a good support bra if your breasts are tender.  Do not use hot tubs, steam rooms, or saunas.  Wear your seat belt when driving.  Avoid raw meat, uncooked cheese, and liter boxes and soil used by cats.  Take your prenatal vitamins.  Try taking medicine that helps you poop (stool softener) as needed, and if your doctor approves. Eat more fiber by eating fresh fruit, vegetables, and whole grains. Drink enough fluids to keep your pee (urine) clear or pale yellow.  Take warm water baths (sitz baths) to soothe pain or discomfort caused by hemorrhoids. Use hemorrhoid cream if your doctor approves.  If you have puffy, bulging veins (varicose veins), wear support hose. Raise (elevate) your feet for 15 minutes, 3-4 times a day. Limit salt in your diet.  Avoid heavy lifting, wear low heels, and sit up straight.  Rest with your legs raised if you have leg cramps or low back pain.  Visit your dentist if you have not gone during your  pregnancy. Use a soft toothbrush to brush your teeth. Be gentle when you floss.  You can have sex (intercourse) unless your doctor tells you not to.  Do not travel far distances unless you must. Only do so with your doctor's approval.  Take prenatal classes.  Practice driving to the hospital.  Pack your hospital bag.  Prepare the baby's room.  Go to your doctor visits. GET HELP IF:  You are not sure if you are in labor or if your water has broken.  You are dizzy.  You have mild cramps or pressure in your lower belly (abdominal).  You have a nagging pain in your belly area.  You continue to feel sick to your stomach (nauseous), throw up (vomit), or have watery poop (diarrhea).  You have bad smelling fluid coming from your vagina.  You have pain with peeing (urination). GET HELP RIGHT AWAY IF:   You have a fever.  You are leaking fluid from your vagina.  You are spotting or bleeding from your vagina.  You have severe belly cramping or pain.  You lose or gain weight rapidly.  You have trouble catching your breath and have chest pain.  You notice sudden or extreme puffiness (swelling) of your face, hands, ankles, feet, or legs.  You have not felt the baby move in over an hour.  You have severe headaches that do not go away with medicine.  You have vision changes. Document Released: 01/23/2010 Document Revised: 02/23/2013 Document Reviewed: 12/30/2012 [facility] Patient Information 2015 [place], [place]. This information is not intended to replace advice given to you by your health care provider. Make sure you discuss any questions you have with your health care provider.

## 2015-04-22 NOTE — Assessment & Plan Note (Deleted)
Continues to do well 

## 2015-04-22 NOTE — Progress Notes (Signed)
KYNNEDI ZWEIG is a 34 y.o. G4P3 at [redacted]w[redacted]d for routine follow up.  She reports good FM, no VB, no abnormal vaginal discharge, no cramping or contractions.  Reports that she is eating well and stays well hydrated.  See flow sheet for further details.  A/P: Pregnancy at [redacted]w[redacted]d.  Doing well.   Pregnancy issues include none today.  Infant feeding choice bottle Contraception choice BTL, consent signed at last visit.  Unsure if she still wants BTL.  She may choose Depo instead. Infant circumcision desired not applicable  Tdap was not given today. GBS/GC/CZ testing was not performed today.  -3 hour glucola results reviewed.  OB f/u ultrasound reviewed.  Both were within normal limits.  Of note, baby is still frank breech position on ultrasound and appears to be breech on Leopold's.  Would consider beside ultrasound at next visit to confirm positioning with possible referral to [facility] for aversion after 36 weeks if indicated.  -Preterm labor precautions reviewed. -Safe sleep discussed. -Kick counts reviewed. -Follow up 2 weeks.

## 2015-04-22 NOTE — Progress Notes (Signed)
Reviewed

## 2015-05-06 ENCOUNTER — Ambulatory Visit (INDEPENDENT_AMBULATORY_CARE_PROVIDER_SITE_OTHER): Payer: Medicaid Other | Admitting: Family Medicine

## 2015-05-06 VITALS — BP 129/82 | HR 87 | Temp 97.9°F | Wt 249.6 lb

## 2015-05-06 DIAGNOSIS — Z3483 Encounter for supervision of other normal pregnancy, third trimester: Secondary | ICD-10-CM

## 2015-05-06 NOTE — Progress Notes (Signed)
Diane Harvey is a 34 y.o. [phone] at [redacted]w[redacted]d for routine follow up.   She reports she is doing well.  No contractions/vaginal bleeding/LOF. Good fetal movement.  See flow sheet for details.  A/P: Pregnancy at [redacted]w[redacted]d.  Doing well.   Pregnancy issues include - Frank breech. Still breech on exam today. Discussed this with OB attending on call Dr. Elly Modena. There are office will be setting up an appointment to discuss version and if she still wants to proceed to set this up. Infant feeding choice - Formula.  Contraception choice - Undecided about BTL vs Depo. Follow up in 1 week.

## 2015-05-06 NOTE — Patient Instructions (Addendum)
It was nice to see you today.  You will be receiving a call from our OB office regarding discussion/setting up your appt to turn your baby.  Follow up in ~ 1 week.  Take care  Dr Lacinda Axon

## 2015-05-11 ENCOUNTER — Ambulatory Visit (INDEPENDENT_AMBULATORY_CARE_PROVIDER_SITE_OTHER): Payer: Medicaid Other | Admitting: Obstetrics & Gynecology

## 2015-05-11 ENCOUNTER — Telehealth: Payer: Self-pay

## 2015-05-11 ENCOUNTER — Encounter: Payer: Self-pay | Admitting: Obstetrics & Gynecology

## 2015-05-11 DIAGNOSIS — O321XX0 Maternal care for breech presentation, not applicable or unspecified: Secondary | ICD-10-CM

## 2015-05-11 NOTE — Progress Notes (Signed)
Pt was seen today and counseled re: external cephalic version for breech presentation at 36 weeks. I reviewed with pt the option of conservative observation vs ECV.  The risks of ECV including failure, abruption and need for emergency c-section were reviewed.  Pt wold like to attempt ECV.   Scheduled for Friday, July 1st @0800 .  All of her questions were answered.  I spent 20 min total with pt in consultation and scheduling of procedure.   Written info was also provided to pt.    Anisten Tomassi L. Harraway-Smith, M.D., Cherlynn June

## 2015-05-11 NOTE — Telephone Encounter (Signed)
-----   Message from Lavonia Drafts, MD sent at 05/11/2015 12:34 PM EDT ----- Please call pt. Her ECV has been changed to July 5th per her request. She should arrive by 0700.  thx [id number]

## 2015-05-11 NOTE — Telephone Encounter (Signed)
Called patient and informed her of new ECV appointment for July 5th at 0700. Patient verbalized understanding and gratitude. NO questions or concerns.

## 2015-05-11 NOTE — Telephone Encounter (Signed)
Patient called stating she had version scheduled for 05/13/15 but cannot make it due to son having appointment same day. Would like to have it rescheduled. Called Dr. Ihor Dow who states they will reschedule it and she will contact us as to what date and time to inform patient of new appointment.

## 2015-05-11 NOTE — Patient Instructions (Signed)
External Cephalic Version External cephalic version is turning a baby that is presenting his or her buttocks first (breech) or is lying sideways in the uterus (transverse) to a head-first position. This makes the labor and delivery faster, safer for the mother and baby, and lessens the chance for a cesarean section. It should not be tried until the pregnancy is [redacted] weeks along or longer. BEFORE THE PROCEDURE   Do not take aspirin.  Do not eat for 4 hours before the procedure.  Tell your caregiver if you have a cold, fever, or an infection.  Tell your caregiver if you are having contractions.  Tell your caregiver if you are leaking or had a gush of fluid from your vagina.  Tell your caregiver if you have any vaginal bleeding or abnormal discharge.  If you are being admitted the same day, arrive at the hospital at least one hour before the procedure to sign any necessary documents and to get prepared for the procedure.  Tell your caregiver if you had any problems with anesthetics in the past.  Tell your caregiver if you are taking any medications that your caregiver does not know about. This includes over-the-counter and prescription drugs, herbs, eye drops and creams. PROCEDURE  First, an ultrasound is done to make sure the baby is breech or transverse.  A non-stress test or biophysical profile is done on the baby before the ECV. This is done to make sure it is safe for the baby to have the ECV. It may also be done after the procedure to make sure the baby is okay.  ECV is done in the delivery/surgical room with an anesthesiologist present. There should be a setup for an emergency cesarean section with a full nursing and nursery staff available and ready.  The patient may be given a medication to relax the uterine muscles. An epidural may be given for any discomfort. It is helpful for the success of the ECV.  An electronic fetal monitor is placed on the uterus during the procedure to  make sure the baby is okay.  If the mother is Rh-negative, Rho (D) immune globulin will be given to her to prevent Rh problems for future pregnancies.  The mother is followed closely for 2 to 3 hours after the procedure to make sure no problems develop. BENEFITS OF ECV  Easier and safer labor and delivery for the mother and baby.  Lower incidence of cesarean section.  Lower costs with a vaginal delivery. RISKS OF ECV  The placenta pulls away from the wall of the uterus before delivery (abruption of the placenta).  Rupture of the uterus, especially in patients with a previous cesarean section.  Fetal distress.  Early (premature) labor.  Premature rupture of the membranes.  The baby will return to the breech or transverse lie position.  Death of the fetus can happen but is very rare. ECV SHOULD BE STOPPED IF:  The fetal heart tones drop.  The mother is having a lot of pain.  You cannot turn the baby after several attempts. ECV SHOULD NOT BE DONE IF:  The non-stress test or biophysical profile is abnormal.  There is vaginal bleeding.  An abnormal shaped uterus is present.  There is heart disease or uncontrolled high blood pressure in the mother.  There are twins or more.  The placenta covers the opening of the cervix (placenta previa).  You had a previous cesarean section with a classical incision or major surgery of the uterus.  There  is not enough amniotic fluid in the sac (oligohydramnios).  The baby is too small for the pregnancy or has not developed normally (anomaly).  Your membranes have ruptured. HOME CARE INSTRUCTIONS   Have someone take you home after the procedure.  Rest at home for several hours.  Have someone stay with you for a few hours after you get home.  After ECV, continue with your prenatal visits as directed.  Continue your regular diet, rest and activities.  Do not do any strenuous activities for a couple of days. SEEK IMMEDIATE  MEDICAL CARE IF:   You develop vaginal bleeding.  You have fluid coming out of your vagina (bag of water may have broken).  You develop uterine contractions.  You do not feel the baby move or there is less movement of the baby.  You develop abdominal pain.  You develop an oral temperature of 102 F (38.9 C) or higher. Document Released: 04/23/2007 Document Revised: 03/15/2014 Document Reviewed: 02/16/2009 [facility] Patient Information 2015 [place], [place]. This information is not intended to replace advice given to you by your health care provider. Make sure you discuss any questions you have with your health care provider.

## 2015-05-12 ENCOUNTER — Telehealth (HOSPITAL_COMMUNITY): Payer: Self-pay | Admitting: *Deleted

## 2015-05-12 NOTE — Telephone Encounter (Signed)
Preadmission screen  

## 2015-05-13 ENCOUNTER — Inpatient Hospital Stay (HOSPITAL_COMMUNITY): Payer: Medicaid Other

## 2015-05-13 ENCOUNTER — Other Ambulatory Visit (HOSPITAL_COMMUNITY)
Admission: RE | Admit: 2015-05-13 | Discharge: 2015-05-13 | Disposition: A | Discharge status: Home / Self Care | Payer: Medicaid Other | Source: Ambulatory Visit | Attending: Family Medicine | Admitting: Family Medicine

## 2015-05-13 ENCOUNTER — Ambulatory Visit (INDEPENDENT_AMBULATORY_CARE_PROVIDER_SITE_OTHER): Payer: Medicaid Other | Admitting: Family Medicine

## 2015-05-13 VITALS — BP 127/75 | HR 87 | Temp 98.4°F | Wt 249.4 lb

## 2015-05-13 DIAGNOSIS — Z113 Encounter for screening for infections with a predominantly sexual mode of transmission: Secondary | ICD-10-CM | POA: Diagnosis not present

## 2015-05-13 DIAGNOSIS — Z3483 Encounter for supervision of other normal pregnancy, third trimester: Secondary | ICD-10-CM

## 2015-05-13 LAB — OB RESULTS CONSOLE GC/CHLAMYDIA: Gonorrhea: NEGATIVE

## 2015-05-13 NOTE — Patient Instructions (Signed)
Vaginal Delivery During delivery, your health care provider will help you give birth to your baby. During a vaginal delivery, you will work to push the baby out of your vagina. However, before you can push your baby out, a few things need to happen. The opening of your uterus (cervix) has to soften, thin out, and open up (dilate) all the way to 10 cm. Also, your baby has to move down from the uterus into your vagina.  SIGNS OF LABOR  Your health care provider will first need to make sure you are in labor. Signs of labor include:   Passing what is called the mucous plug before labor begins. This is a small amount of blood-stained mucus.  Having regular, painful uterine contractions.   The time between contractions gets shorter.   The discomfort and pain gradually get more intense.  Contraction pains get worse when walking and do not go away when resting.   Your cervix becomes thinner (effacement) and dilates. BEFORE THE DELIVERY Once you are in labor and admitted into the hospital or care center, your health care provider may do the following:   Perform a complete physical exam.  Review any complications related to pregnancy or labor.  Check your blood pressure, pulse, temperature, and heart rate (vital signs).   Determine if, and when, the rupture of amniotic membranes occurred.  Do a vaginal exam (using a sterile glove and lubricant) to determine:   The position (presentation) of the baby. Is the baby's head presenting first (vertex) in the birth canal (vagina), or are the feet or buttocks first (breech)?   The level (station) of the baby's head within the birth canal.   The effacement and dilatation of the cervix.   An electronic fetal monitor is usually placed on your abdomen when you first arrive. This is used to monitor your contractions and the baby's heart rate.  When the monitor is on your abdomen (external fetal monitor), it can only pick up the frequency and  length of your contractions. It cannot tell the strength of your contractions.  If it becomes necessary for your health care provider to know exactly how strong your contractions are or to see exactly what the baby's heart rate is doing, an internal monitor may be inserted into your vagina and uterus. Your health care provider will discuss the benefits and risks of using an internal monitor and obtain your permission before inserting the device.  Continuous fetal monitoring may be needed if you have an epidural, are receiving certain medicines (such as oxytocin), or have pregnancy or labor complications.  An IV access tube may be placed into a vein in your arm to deliver fluids and medicines if necessary. THREE STAGES OF LABOR AND DELIVERY Normal labor and delivery is divided into three stages. First Stage This stage starts when you begin to contract regularly and your cervix begins to efface and dilate. It ends when your cervix is completely open (fully dilated). The first stage is the longest stage of labor and can last from 3 hours to 15 hours.  Several methods are available to help with labor pain. You and your health care provider will decide which option is best for you. Options include:   Opioid medicines. These are strong pain medicines that you can get through your IV tube or as a shot into your muscle. These medicines lessen pain but do not make it go away completely.  Epidural. A medicine is given through a thin tube that   is inserted in your back. The medicine numbs the lower part of your body and prevents any pain in that area.  Paracervical pain medicine. This is an injection of an anesthetic on each side of your cervix.   You may request natural childbirth, which does not involve the use of pain medicines or an epidural during labor and delivery. Instead, you will use other things, such as breathing exercises, to help cope with the pain. Second Stage The second stage of labor  begins when your cervix is fully dilated at 10 cm. It continues until you push your baby down through the birth canal and the baby is born. This stage can take only minutes or several hours.  The location of your baby's head as it moves through the birth canal is reported as a number called a station. If the baby's head has not started its descent, the station is described as being at minus 3 (-3). When your baby's head is at the zero station, it is at the middle of the birth canal and is engaged in the pelvis. The station of your baby helps indicate the progress of the second stage of labor.  When your baby is born, your health care provider may hold the baby with his or her head lowered to prevent amniotic fluid, mucus, and blood from getting into the baby's lungs. The baby's mouth and nose may be suctioned with a small bulb syringe to remove any additional fluid.  Your health care provider may then place the baby on your stomach. It is important to keep the baby from getting cold. To do this, the health care provider will dry the baby off, place the baby directly on your skin (with no blankets between you and the baby), and cover the baby with warm, dry blankets.   The umbilical cord is cut. Third Stage During the third stage of labor, your health care provider will deliver the placenta (afterbirth) and make sure your bleeding is under control. The delivery of the placenta usually takes about 5 minutes but can take up to 30 minutes. After the placenta is delivered, a medicine may be given either by IV or injection to help contract the uterus and control bleeding. If you are planning to breastfeed, you can try to do so now. After you deliver the placenta, your uterus should contract and get very firm. If your uterus does not remain firm, your health care provider will massage it. This is important because the contraction of the uterus helps cut off bleeding at the site where the placenta was attached  to your uterus. If your uterus does not contract properly and stay firm, you may continue to bleed heavily. If there is a lot of bleeding, medicines may be given to contract the uterus and stop the bleeding.  Document Released: 08/07/2008 Document Revised: 03/15/2014 Document Reviewed: 04/19/2013 [facility] Patient Information 2015 [place], [place]. This information is not intended to replace advice given to you by your health care provider. Make sure you discuss any questions you have with your health care provider.   External Cephalic Version External cephalic version is turning a baby that is presenting his or her buttocks first (breech) or is lying sideways in the uterus (transverse) to a head-first position. This makes the labor and delivery faster, safer for the mother and baby, and lessens the chance for a cesarean section. It should not be tried until the pregnancy is [redacted] weeks along or longer. BEFORE THE PROCEDURE  Do not take aspirin.  Do not eat for 4 hours before the procedure.  Tell your caregiver if you have a cold, fever, or an infection.  Tell your caregiver if you are having contractions.  Tell your caregiver if you are leaking or had a gush of fluid from your vagina.  Tell your caregiver if you have any vaginal bleeding or abnormal discharge.  If you are being admitted the same day, arrive at the hospital at least one hour before the procedure to sign any necessary documents and to get prepared for the procedure.  Tell your caregiver if you had any problems with anesthetics in the past.  Tell your caregiver if you are taking any medications that your caregiver does not know about. This includes over-the-counter and prescription drugs, herbs, eye drops and creams. PROCEDURE  First, an ultrasound is done to make sure the baby is breech or transverse.  A non-stress test or biophysical profile is done on the baby before the ECV. This is done to make sure it is safe for the  baby to have the ECV. It may also be done after the procedure to make sure the baby is okay.  ECV is done in the delivery/surgical room with an anesthesiologist present. There should be a setup for an emergency cesarean section with a full nursing and nursery staff available and ready.  The patient may be given a medication to relax the uterine muscles. An epidural may be given for any discomfort. It is helpful for the success of the ECV.  An electronic fetal monitor is placed on the uterus during the procedure to make sure the baby is okay.  If the mother is Rh-negative, Rho (D) immune globulin will be given to her to prevent Rh problems for future pregnancies.  The mother is followed closely for 2 to 3 hours after the procedure to make sure no problems develop. BENEFITS OF ECV  Easier and safer labor and delivery for the mother and baby.  Lower incidence of cesarean section.  Lower costs with a vaginal delivery. RISKS OF ECV  The placenta pulls away from the wall of the uterus before delivery (abruption of the placenta).  Rupture of the uterus, especially in patients with a previous cesarean section.  Fetal distress.  Early (premature) labor.  Premature rupture of the membranes.  The baby will return to the breech or transverse lie position.  Death of the fetus can happen but is very rare. ECV SHOULD BE STOPPED IF:  The fetal heart tones drop.  The mother is having a lot of pain.  You cannot turn the baby after several attempts. ECV SHOULD NOT BE DONE IF:  The non-stress test or biophysical profile is abnormal.  There is vaginal bleeding.  An abnormal shaped uterus is present.  There is heart disease or uncontrolled high blood pressure in the mother.  There are twins or more.  The placenta covers the opening of the cervix (placenta previa).  You had a previous cesarean section with a classical incision or major surgery of the uterus.  There is not enough  amniotic fluid in the sac (oligohydramnios).  The baby is too small for the pregnancy or has not developed normally (anomaly).  Your membranes have ruptured. HOME CARE INSTRUCTIONS   Have someone take you home after the procedure.  Rest at home for several hours.  Have someone stay with you for a few hours after you get home.  After ECV, continue with your prenatal  visits as directed.  Continue your regular diet, rest and activities.  Do not do any strenuous activities for a couple of days. SEEK IMMEDIATE MEDICAL CARE IF:   You develop vaginal bleeding.  You have fluid coming out of your vagina (bag of water may have broken).  You develop uterine contractions.  You do not feel the baby move or there is less movement of the baby.  You develop abdominal pain.  You develop an oral temperature of 102 F (38.9 C) or higher. Document Released: 04/23/2007 Document Revised: 03/15/2014 Document Reviewed: 02/16/2009 [facility] Patient Information 2015 [place], [place]. This information is not intended to replace advice given to you by your health care provider. Make sure you discuss any questions you have with your health care provider.   Breech Delivery A breech birth is when a baby is born with the buttocks or the feet first. Most babies are in a head down (vertex) position when they are born. Many babies are breech early in the pregnancy but turn to headfirst position toward the end of the pregnancy. There are three types of breech babies that can pose a risk to the baby and may need delivery by cesarean delivery. These include:  When the baby's buttocks are showing first in the birth canal (vagina) with the legs straight up and the feet at the baby's head (frank breech).  When the baby's buttocks shows first with the legs bent at the knees and the feet down near the buttocks (complete breech).  When one or both of the baby's feet are down below the buttocks (footling breech). RISKS  WITH BREECH DELIVERIES:  Umbilical cord prolapse. This is when the umbilical cord is in front of the baby before or during labor.  Injury to the nerves in the shoulder, arm and hand (brachial plexus injury) when delivered.  Birth defects, overly large head (hydrocephalic), and neural tube defects (spina bifida) are seen more often.  Premature babies are seen more often. These are babies that are born too early.  There is an increase rate for cesarean delivery with breech babies. CAUSE OF BREECH PREGNANCY:  The mother has had several babies.  Having twins or more.  The uterus is abnormal in shape and size (double uterus).  The baby weighs less than 5 pounds.  There is too much or not enough amniotic fluid.  There is a tumor in the uterus.  All or part of the placenta covers the opening of the uterus (placenta previa). DIAGNOSIS  When you are close to your due date, your caregiver can tell if your baby is breech by an abdominal or vaginal exam, an X-ray or ultrasound, or any combination of both. TREATMENT   More problems are likely if the baby is breech, but some babies may be delivered safely without a cesarean delivery. A cesarean delivery also has risks such as longer hospital stays, blood clots, infection, or bleeding. A breech delivery can compress the umbilical cord or cut off the blood supply to the baby. This can cause brain damage or death to the baby.  You and your caregiver will discuss the best way to deliver the baby in your particular circumstance.  Your caregiver may try to turn the baby in your uterus. This is done toward the end of a normal pregnancy with your caregiver placing both hands on the abdomen and gently and slowly turning the baby around. This is a procedure called external cephalic version (ECV). If this procedure is successful and the  baby stays head down, a normal delivery is more likely.  Some doctors will plan to deliver a breech baby by cesarean  delivery. An ECV should be attempted only when:  The pregnancy is more than [redacted] weeks along.  The breech should be confirmed with an ultrasound.  ECV should only be done in a delivery/surgical room with an anesthesiologist, surgical nurses and pediatric nursery nurses, and preferably a pediatrician present. All should be ready for an emergency cesarean delivery if necessary.  A medication is given before the ECV is attempted to relax the muscles of the uterus.  A nonstress test on the baby should be normal.  A biophysical profile on the baby should be normal.  The baby should have continuous monitoring during the ECV.  Using regional anesthesia (epidural) helps the outcome of the ECV to be more successful.  The baby should be monitored for 1 to 2 hours after the procedure. The benefits of ECV are:   Lower risk to the baby when delivered head first.  Lower cost.  Decrease in rate of cesarean delivery. The ECV should not be used if you have:  Vaginal bleeding.  Placenta previa.  A nonreactive nonstress test of the baby.  An abnormal biophysical profile of the baby.  An abnormally small baby.  A low level of fluid in the sac that surrounds and protects the baby.  An abnormal fetal heart rate.  Early rupture of the membranes.  Twins or other multiple pregnancy.  An abnormal shaped uterus, tumor, or genetic defect (double uterus). More than half of external cephalic versions work. Even when they work, there is always a chance that the baby will turn back to the breech position.  HOME CARE INSTRUCTIONS  When you are pregnant:  See your caregiver regularly.  Take your prenatal vitamins as suggested.  Eat a well-balanced diet and exercise regularly unless instructed otherwise.  Get plenty of rest and sleep. After you have the baby:  You may have a small amount of bleeding for a week or so.  You may have some cramping of the uterus, especially if you are  nursing.  Do not have sexual intercourse until your caregiver says it is okay.  Do not use tampons or douche.  Do not take aspirin because it can cause bleeding.  If you had a cesarean delivery, you may have some pain in the cut (incision). Your caregiver will advise you or give you pain medication.  Do not lift anything over 5 pounds.  Try to have help at home for 2 to 3 weeks.  Keep all your post-delivery appointments. SEEK IMMEDIATE MEDICAL CARE IF:  Before you have the baby:  You have vaginal bleeding.  You are leaking fluid or have a gush of fluid from the vagina.  You develop uterine contractions.  You develop a temperature of 100F (37.8C) or higher.  You feel the baby is not moving or is moving less than normal. After you have the baby:  You develop a temperature of 100F (37.8C) or higher.  You have heavy vaginal bleeding.  You develop abnormal vaginal discharge.  You have pain when you urinate.  You become light-headed or faint.  You have leg pain, chest pain, or shortness of breath.  If you had a cesarean delivery and develop redness and swelling around the incision. You see fluid (pus) coming from the incision. Call your caregiver if you think you are developing any problems during the pregnancy or after you have the  baby. Document Released: 12/20/2006 Document Revised: 03/15/2014 Document Reviewed: 06/16/2008 [facility] Patient Information 2015 [place], [place]. This information is not intended to replace advice given to you by your health care provider. Make sure you discuss any questions you have with your health care provider.

## 2015-05-13 NOTE — Progress Notes (Signed)
Doing well today. Denies vaginal bleeding or discharge and cramping. Denies contractions. Continues to note fetal movement. Her and her husband weighed pros and cons of version and decided against, stating she want to see if the baby will turn on her own and if not plans for c-section. Plans to bottle feed. Still undecided on BTL vs Depo for birth control. No acute concerns today. GBS and GC/Chlamydia screens today, to call Diane Harvey with results. Counseled on version, C-section delivery, and signs of labor.

## 2015-05-15 LAB — STREP B DNA PROBE: STREP GROUP B AG: NOT DETECTED

## 2015-05-17 ENCOUNTER — Inpatient Hospital Stay (HOSPITAL_COMMUNITY): Admission: RE | Admit: 2015-05-17 | Payer: Medicaid Other | Source: Ambulatory Visit

## 2015-05-17 LAB — CERVICOVAGINAL ANCILLARY ONLY
Chlamydia: NEGATIVE
NEISSERIA GONORRHEA: NEGATIVE

## 2015-05-20 ENCOUNTER — Ambulatory Visit (INDEPENDENT_AMBULATORY_CARE_PROVIDER_SITE_OTHER): Payer: Medicaid Other | Admitting: Family Medicine

## 2015-05-20 VITALS — BP 138/87 | HR 94 | Temp 98.3°F | Wt 249.8 lb

## 2015-05-20 DIAGNOSIS — K649 Unspecified hemorrhoids: Secondary | ICD-10-CM | POA: Insufficient documentation

## 2015-05-20 DIAGNOSIS — Z3483 Encounter for supervision of other normal pregnancy, third trimester: Secondary | ICD-10-CM

## 2015-05-20 MED ORDER — HYDROCORTISONE 2.5 % RE CREA: 1.0000 "application " | TOPICAL_CREAM | Freq: Two times a day (BID) | RECTAL | Status: DC

## 2015-05-20 NOTE — Progress Notes (Signed)
Subjective:  Diane Harvey is a 34 y.o. [phone] at [redacted]w[redacted]d being seen today for ongoing prenatal care.  Patient reports no bleeding, no contractions, no cramping, no leaking and reports constipation with hemorrhoid that is painful.  Hemmorhoid has been present x2 days.  Reports constipation x3 days.  No rectal bleeding.  No pain with defecation but discomfort with sitting and straining during BM.Marland Kitchen  Contractions: Not present.  Vag. Bleeding: None. Movement: Present. Denies leaking of fluid.   The following portions of the patient's history were reviewed and updated as appropriate: allergies, current medications, past family history, past medical history, past social history, past surgical history and problem list.   Objective:   Filed Vitals:   05/20/15 1441  BP: 138/87  Pulse: 94  Temp: 98.3 F (36.8 C)  Weight: 249 lb 12.8 oz (113.309 kg)    Fetal Status: Fetal Heart Rate (bpm): 143 Fundal Height: 38 cm Movement: Present     General:  Alert, oriented and cooperative. Patient is in no acute distress.  Skin: Skin is warm and dry. No rash noted.   Cardiovascular: Normal heart rate noted, RRR, no murmurs  Respiratory: Normal respiratory effort, no problems with respiration noted, CTAB  Abdomen: Soft, gravid, appropriate for gestational age. Pain/Pressure: Absent     Vaginal: Vag. Bleeding: None.       Cervix: Not evaluated        Extremities: Normal range of motion.     Mental Status: Normal mood and affect. Normal behavior. Normal judgment and thought content.   GU: Anus evaluated.  External hemorrhoid appreciated at the 10 o'clock position.  No necrosis, bleeding appreciated. Hemorrhoid slightly TTP.  Assessment and Plan:  Pregnancy: [id number] at [redacted]w[redacted]d  Supervision of normal pregnancy in the third trimester: -Bedside u/s performed under the supervision of Dr Gwendlyn Deutscher.  Baby is cephalic presentation on ultrasound.  -Patient also reports that she has opted for Depo as her method of  contraception after delivery.  Term labor symptoms and general obstetric precautions including but not limited to vaginal bleeding, contractions, leaking of fluid and fetal movement were reviewed in detail with the patient.  External Hemorrhoid: Anusol HC cream Rx'd.  Patient instructed to seek medical attention if hemorrhoid becomes extremely painful, she exhibits rectal bleeding, she becomes febrile.  Please refer to After Visit Summary for other counseling recommendations.   Follow up in 1 week for routine prenatal care.  Janora Norlander, DO

## 2015-05-20 NOTE — Patient Instructions (Addendum)
A cream has been sent into your pharmacy for the hemorrhoid.  Follow up in 1 week for routine prenatal care.   Hemorrhoids Hemorrhoids are puffy (swollen) veins around the rectum or anus. Hemorrhoids can cause pain, itching, bleeding, or irritation. HOME CARE  Eat foods with fiber, such as whole grains, beans, nuts, fruits, and vegetables. Ask your doctor about taking products with added fiber in them (fibersupplements).  Drink enough fluid to keep your pee (urine) clear or pale yellow.  Exercise often.  Go to the bathroom when you have the urge to poop. Do not wait.  Avoid straining to poop (bowel movement).  Keep the butt area dry and clean. Use wet toilet paper or moist paper towels.  Medicated creams and medicine inserted into the anus (anal suppository) may be used or applied as told.  Only take medicine as told by your doctor.  Take a warm water bath (sitz bath) for 15-20 minutes to ease pain. Do this 3-4 times a day.  Place ice packs on the area if it is tender or puffy. Use the ice packs between the warm water baths.  Put ice in a plastic bag.  Place a towel between your skin and the bag.  Leave the ice on for 15-20 minutes, 03-04 times a day.  Do not use a donut-shaped pillow or sit on the toilet for a long time. GET HELP RIGHT AWAY IF:   You have more pain that is not controlled by treatment or medicine.  You have bleeding that will not stop.  You have trouble or are unable to poop (bowel movement).  You have pain or puffiness outside the area of the hemorrhoids. MAKE SURE YOU:   Understand these instructions.  Will watch your condition.  Will get help right away if you are not doing well or get worse. Document Released: 08/07/2008 Document Revised: 10/15/2012 Document Reviewed: 09/09/2012 [facility] Patient Information 2015 [place], [place]. This information is not intended to replace advice given to you by your health care provider. Make sure you discuss  any questions you have with your health care provider.

## 2015-05-23 ENCOUNTER — Ambulatory Visit (INDEPENDENT_AMBULATORY_CARE_PROVIDER_SITE_OTHER): Payer: Medicaid Other | Admitting: Family Medicine

## 2015-05-23 VITALS — BP 127/74 | HR 80 | Temp 98.4°F | Wt 249.5 lb

## 2015-05-23 DIAGNOSIS — Z3483 Encounter for supervision of other normal pregnancy, third trimester: Secondary | ICD-10-CM

## 2015-05-23 NOTE — Patient Instructions (Signed)
Third Trimester of Pregnancy The third trimester is from week 29 through week 42, months 7 through 9. The third trimester is a time when the fetus is growing rapidly. At the end of the ninth month, the fetus is about 20 inches in length and weighs 6-10 pounds.  BODY CHANGES Your body goes through many changes during pregnancy. The changes vary from woman to woman.   Your weight will continue to increase. You can expect to gain 25-35 pounds (11-16 kg) by the end of the pregnancy.  You may begin to get stretch marks on your hips, abdomen, and breasts.  You may urinate more often because the fetus is moving lower into your pelvis and pressing on your bladder.  You may develop or continue to have heartburn as a result of your pregnancy.  You may develop constipation because certain hormones are causing the muscles that push waste through your intestines to slow down.  You may develop hemorrhoids or swollen, bulging veins (varicose veins).  You may have pelvic pain because of the weight gain and pregnancy hormones relaxing your joints between the bones in your pelvis. Backaches may result from overexertion of the muscles supporting your posture.  You may have changes in your hair. These can include thickening of your hair, rapid growth, and changes in texture. Some women also have hair loss during or after pregnancy, or hair that feels dry or thin. Your hair will most likely return to normal after your baby is born.  Your breasts will continue to grow and be tender. A yellow discharge may leak from your breasts called colostrum.  Your belly button may stick out.  You may feel short of breath because of your expanding uterus.  You may notice the fetus "dropping," or moving lower in your abdomen.  You may have a bloody mucus discharge. This usually occurs a few days to a week before labor begins.  Your cervix becomes thin and soft (effaced) near your due date. WHAT TO EXPECT AT YOUR PRENATAL  EXAMS  You will have prenatal exams every 2 weeks until week 36. Then, you will have weekly prenatal exams. During a routine prenatal visit:  You will be weighed to make sure you and the fetus are growing normally.  Your blood pressure is taken.  Your abdomen will be measured to track your baby's growth.  The fetal heartbeat will be listened to.  Any test results from the previous visit will be discussed.  You may have a cervical check near your due date to see if you have effaced. At around 36 weeks, your caregiver will check your cervix. At the same time, your caregiver will also perform a test on the secretions of the vaginal tissue. This test is to determine if a type of bacteria, Group B streptococcus, is present. Your caregiver will explain this further. Your caregiver may ask you:  What your birth plan is.  How you are feeling.  If you are feeling the baby move.  If you have had any abnormal symptoms, such as leaking fluid, bleeding, severe headaches, or abdominal cramping.  If you have any questions. Other tests or screenings that may be performed during your third trimester include:  Blood tests that check for low iron levels (anemia).  Fetal testing to check the health, activity level, and growth of the fetus. Testing is done if you have certain medical conditions or if there are problems during the pregnancy. FALSE LABOR You may feel small, irregular contractions that   eventually go away. These are called Braxton Hicks contractions, or false labor. Contractions may last for hours, days, or even weeks before true labor sets in. If contractions come at regular intervals, intensify, or become painful, it is best to be seen by your caregiver.  SIGNS OF LABOR   Menstrual-like cramps.  Contractions that are 5 minutes apart or less.  Contractions that start on the top of the uterus and spread down to the lower abdomen and back.  A sense of increased pelvic pressure or back  pain.  A watery or bloody mucus discharge that comes from the vagina. If you have any of these signs before the 37th week of pregnancy, call your caregiver right away. You need to go to the hospital to get checked immediately. HOME CARE INSTRUCTIONS   Avoid all smoking, herbs, alcohol, and unprescribed drugs. These chemicals affect the formation and growth of the baby.  Follow your caregiver's instructions regarding medicine use. There are medicines that are either safe or unsafe to take during pregnancy.  Exercise only as directed by your caregiver. Experiencing uterine cramps is a good sign to stop exercising.  Continue to eat regular, healthy meals.  Wear a good support bra for breast tenderness.  Do not use hot tubs, steam rooms, or saunas.  Wear your seat belt at all times when driving.  Avoid raw meat, uncooked cheese, cat litter boxes, and soil used by cats. These carry germs that can cause birth defects in the baby.  Take your prenatal vitamins.  Try taking a stool softener (if your caregiver approves) if you develop constipation. Eat more high-fiber foods, such as fresh vegetables or fruit and whole grains. Drink plenty of fluids to keep your urine clear or pale yellow.  Take warm sitz baths to soothe any pain or discomfort caused by hemorrhoids. Use hemorrhoid cream if your caregiver approves.  If you develop varicose veins, wear support hose. Elevate your feet for 15 minutes, 3-4 times a day. Limit salt in your diet.  Avoid heavy lifting, wear low heal shoes, and practice good posture.  Rest a lot with your legs elevated if you have leg cramps or low back pain.  Visit your dentist if you have not gone during your pregnancy. Use a soft toothbrush to brush your teeth and be gentle when you floss.  A sexual relationship may be continued unless your caregiver directs you otherwise.  Do not travel far distances unless it is absolutely necessary and only with the approval  of your caregiver.  Take prenatal classes to understand, practice, and ask questions about the labor and delivery.  Make a trial run to the hospital.  Pack your hospital bag.  Prepare the baby's nursery.  Continue to go to all your prenatal visits as directed by your caregiver. SEEK MEDICAL CARE IF:  You are unsure if you are in labor or if your water has broken.  You have dizziness.  You have mild pelvic cramps, pelvic pressure, or nagging pain in your abdominal area.  You have persistent nausea, vomiting, or diarrhea.  You have a bad smelling vaginal discharge.  You have pain with urination. SEEK IMMEDIATE MEDICAL CARE IF:   You have a fever.  You are leaking fluid from your vagina.  You have spotting or bleeding from your vagina.  You have severe abdominal cramping or pain.  You have rapid weight loss or gain.  You have shortness of breath with chest pain.  You notice sudden or extreme swelling   of your face, hands, ankles, feet, or legs.  You have not felt your baby move in over an hour.  You have severe headaches that do not go away with medicine.  You have vision changes. Document Released: 10/23/2001 Document Revised: 11/03/2013 Document Reviewed: 12/30/2012 ExitCare Patient Information 2015 [employer]. This information is not intended to replace advice given to you by your health care provider. Make sure you discuss any questions you have with your health care provider.  

## 2015-05-23 NOTE — Progress Notes (Signed)
Doing well. No complaints. No vaginal bleeding, discharge or leaking of fluid. No contractions. Did have an episode of right sided abdominal pain that improved after the baby shifted. No global edema. GBS/Chlamydia/GC negative. Ultrasound performed 3 days ago showed baby in vertex position. Discussed signs of labor and red flags.

## 2015-05-29 IMAGING — US US OB FOLLOW-UP
1 series · 12 of 28 positions shown · non-contrast
Comparison: none

[Series 1: us ob follow-up · 0.27mm/px · 85 acquisitions, 12 frames shown]
[im 4/85]
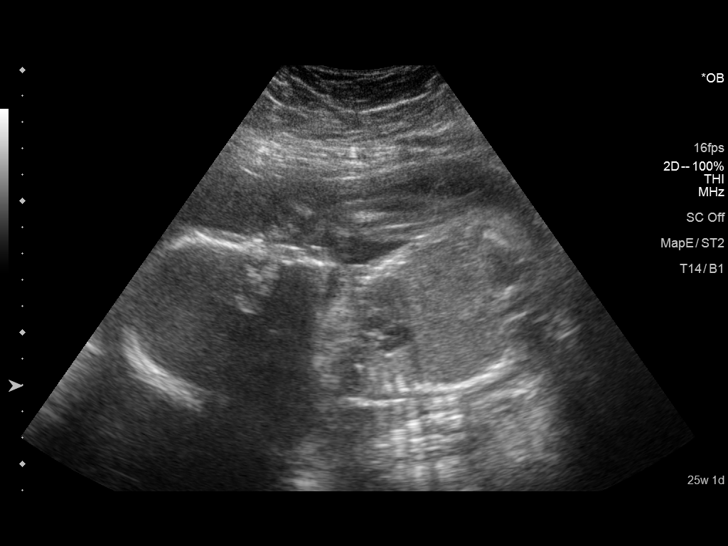
[im 10/85]
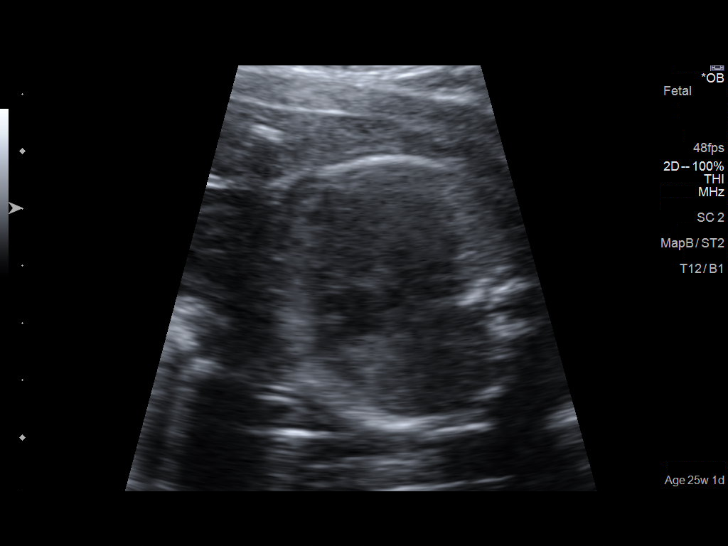
[im 16/85]
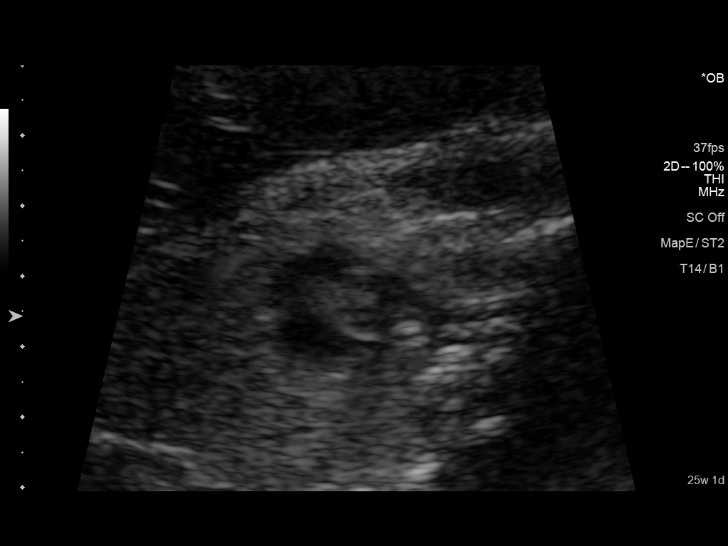
[im 25/85]
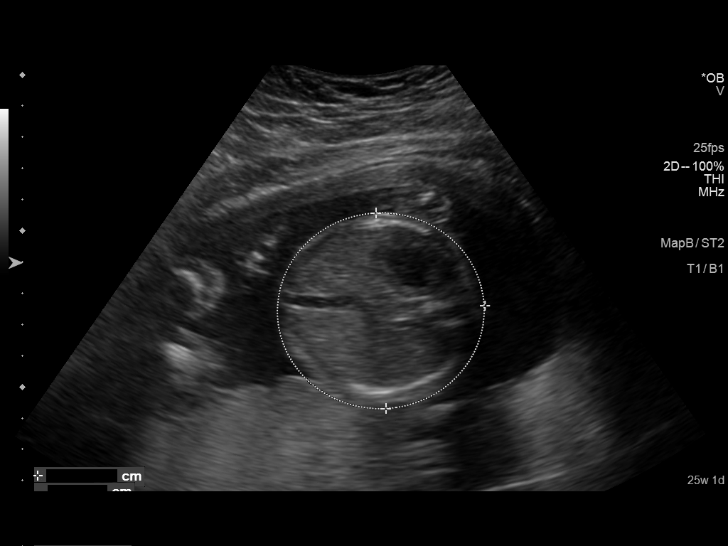
[im 32/85]
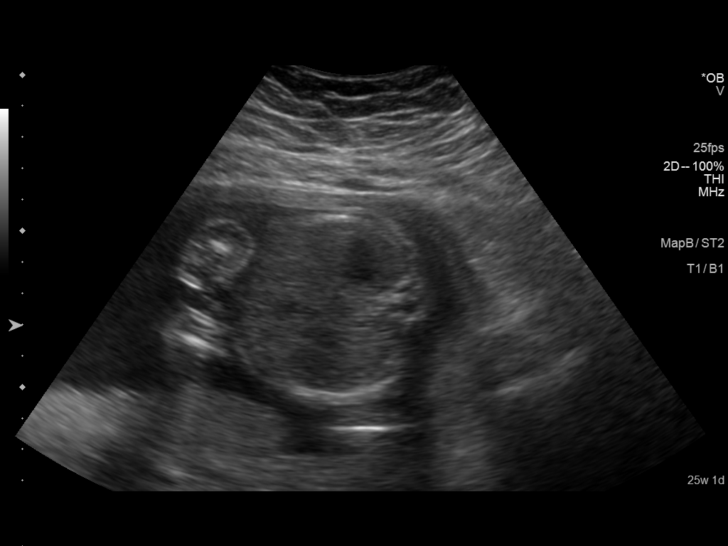
[im 38/85]
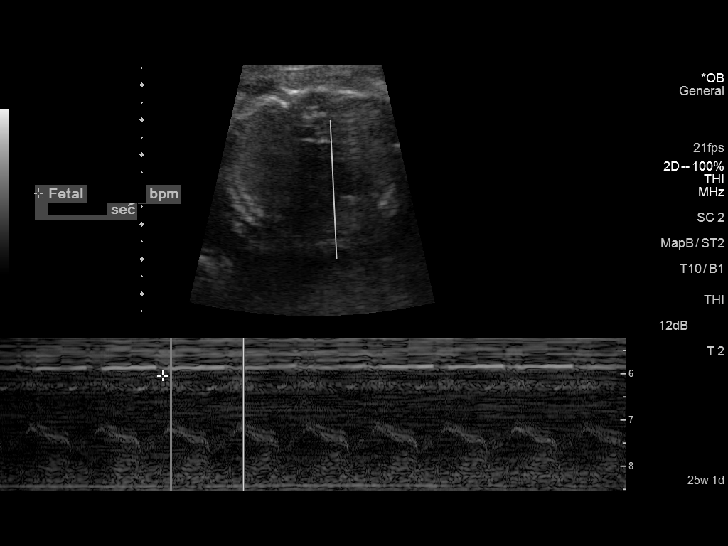
[im 47/85]
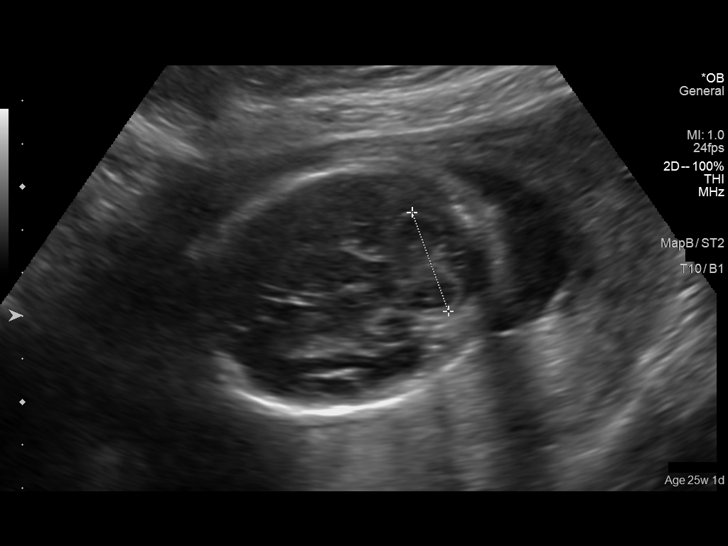
[im 53/85]
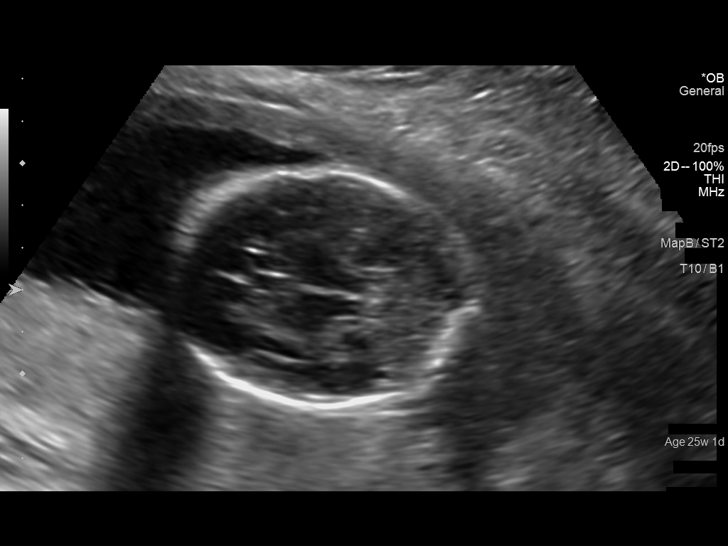
[im 60/85]
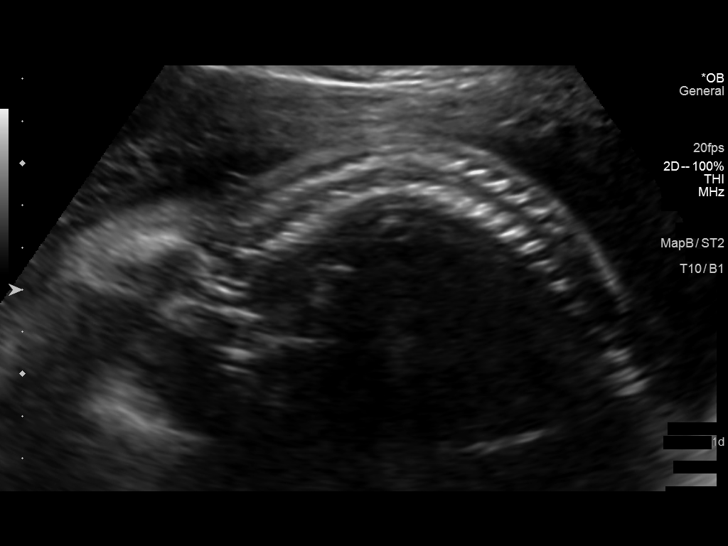
[im 69/85]
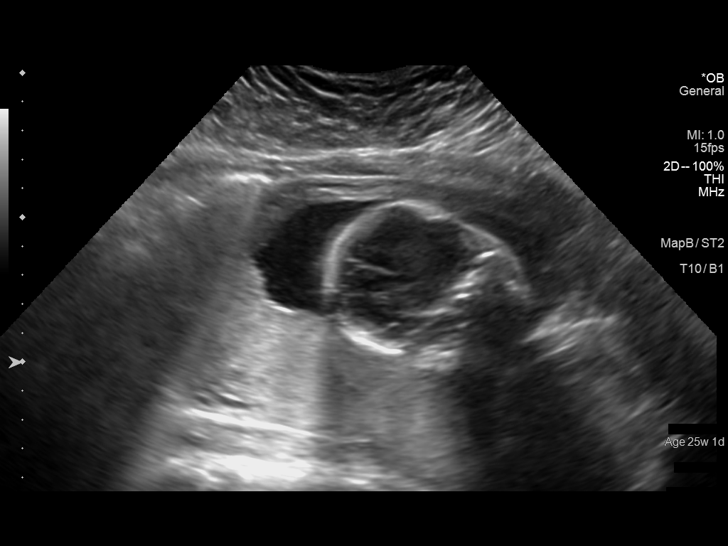
[im 75/85]
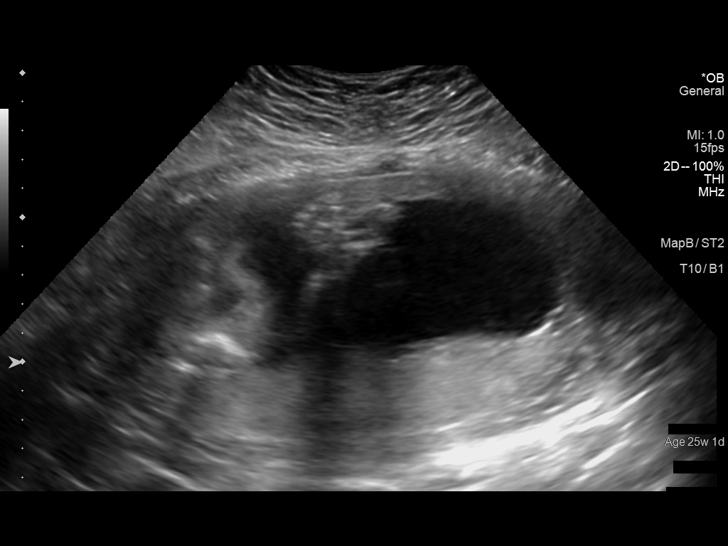
[im 81/85]
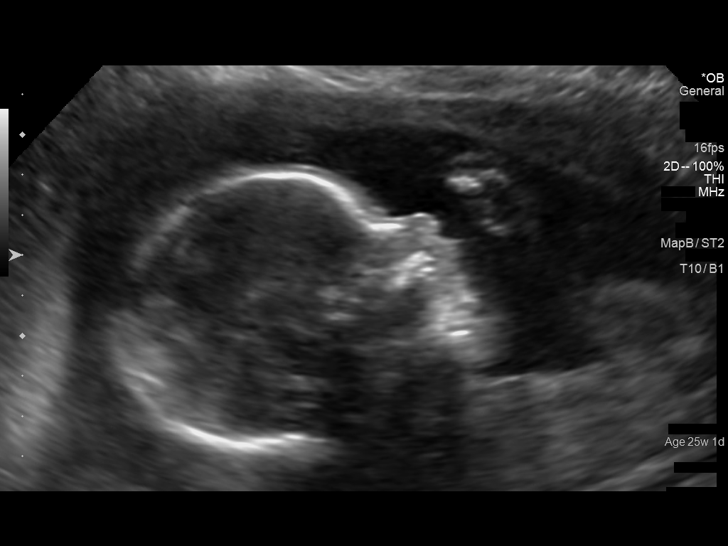

[12 of 28 positions shown; findings below may reference images not displayed]

OBSTETRICS REPORT
(Signed Final 02/23/2015 [DATE])

Service(s) Provided

US OB FOLLOW UP                                       76816.1
Indications

25 weeks gestation of pregnancy
Advanced maternal age multigravida 33+, second
trimester (declined HUAN)
No or Little Prenatal Care
Uterine fibroids
Evaluate anatomy not seen on prior sonogram           [id number]
Fetal Evaluation

Num Of Fetuses:    1
Fetal Heart Rate:  141                          bpm
Cardiac Activity:  Observed
Presentation:      Breech
Placenta:          Posterior, above cervical
os
P. Cord            Previously Visualized
Insertion:

Amniotic Fluid
AFI FV:      Subjectively within normal limits
Larg Pckt:    4.62  cm
Biometry

BPD:       56  mm     G. Age:  23w 1d                CI:         76.2   70 - 86
OFD:     73.5  mm                                    FL/HC:      21.9   18.7 -
20.3
HC:       212  mm     G. Age:  23w 2d      < 3  %    HC/AC:      1.05   1.04 -
1.22
AC:       202  mm     G. Age:  24w 6d       32  %    FL/BPD:     83.0   71 - 87
FL:      46.5  mm     G. Age:  25w 4d       47  %    FL/AC:      23.0   20 - 24
HUM:     42.2  mm     G. Age:  25w 3d       49  %
CER:     24.5  mm     G. Age:  22w 4d      < 5  %

Est. FW:     737  gm    1 lb 10 oz      46  %
Gestational Age

LMP:           25w 1d        Date:  08/31/14                 EDD:   06/07/15
U/S Today:     24w 2d                                        EDD:   06/13/15
Best:          25w 1d     Det. By:  LMP  (08/31/14)          EDD:   06/07/15
Anatomy

Cranium:          Appears normal         Aortic Arch:      Appears normal
Fetal Cavum:      Appears normal         Ductal Arch:      Appears normal
Ventricles:       Appears normal         Diaphragm:        Appears normal
Choroid Plexus:   Appears normal         Stomach:          Appears normal, left
sided
Cerebellum:       Appears normal         Abdomen:          Appears normal
Posterior Fossa:  Appears normal         Abdominal Wall:   Previously seen
Nuchal Fold:      Not applicable (>20    Cord Vessels:     Appears normal (3
wks GA)                                  vessel cord)
Face:             Orbits and profile     Kidneys:          Appear normal
previously seen
Lips:             Appears normal         Bladder:          Appears normal
Heart:            Appears normal         Spine:            Previously seen
(4CH, axis, and
situs)
RVOT:             Appears normal         Lower             Previously seen
Extremities:
LVOT:             Appears normal         Upper             Previously seen
Extremities:

Other:  Fetus appears to be a female. Heels previously seen. Nasal bone
visualized.  Technically difficult due to maternal habitus and fetal
position.
Cervix Uterus Adnexa

Cervical Length:    4.29     cm

Cervix:       Normal appearance by transabdominal scan.
Uterus:       No fibroids seen.
Cul De Sac:   No free fluid seen.
Left Ovary:    No adnexal mass visualized.
Right Ovary:   No adnexal mass visualized.
Adnexa:     No abnormality visualized.
Comment:    Previously visualized anterior fibroid not visualized on
today's exam.
Impression

Single IUP at 25w 1d
Normal fetal anatomic survey
The HC measures < 3rd %tile, but not > 2SD below the mean
for gestational age
Normal cranial anatomy appreciated
Interval growth is appropriate (46th %tile)
Normal amniotic fluid volume
Recommendations

Recommend follow-up ultrasound examination in 4 weeks for
interval growth due to HC < 3rd %tile.

questions or concerns.

## 2015-06-03 ENCOUNTER — Ambulatory Visit (INDEPENDENT_AMBULATORY_CARE_PROVIDER_SITE_OTHER): Payer: Medicaid Other | Admitting: Family Medicine

## 2015-06-03 ENCOUNTER — Other Ambulatory Visit: Payer: Self-pay | Admitting: Family Medicine

## 2015-06-03 VITALS — BP 125/75 | HR 85 | Temp 98.3°F | Wt 250.5 lb

## 2015-06-03 DIAGNOSIS — Z3483 Encounter for supervision of other normal pregnancy, third trimester: Secondary | ICD-10-CM

## 2015-06-03 DIAGNOSIS — O480 Post-term pregnancy: Secondary | ICD-10-CM

## 2015-06-03 NOTE — Progress Notes (Signed)
Subjective:  Diane Harvey is a 34 y.o. [phone] at [redacted]w[redacted]d being seen today for ongoing prenatal care.  Patient reports fatigue, no bleeding, no cramping, no leaking, occasional contractions and low back pain that comes and goes..  Contractions: Irritability.   . Movement: Present. +FM.  Denies leaking of fluid.   The following portions of the patient's history were reviewed and updated as appropriate: allergies, current medications, past family history, past medical history, past social history, past surgical history and problem list.   Objective:   Filed Vitals:   06/03/15 1547  BP: 125/75  Pulse: 85  Weight: 250 lb 8 oz (113.626 kg)   Fetal Status: Fetal Heart Rate (bpm): 133 Fundal Height: 39 cm Movement: Present  Presentation: Vertex  General:  Alert, oriented and cooperative. Patient is in no acute distress.  Skin: Skin is warm and dry. No rash noted.   Cardiovascular: RRR, no murmurs, +2 DP  Respiratory: Normal respiratory effort, CTAB, no wheeze  Abdomen: Soft, gravid, appropriate for gestational age. Pain/Pressure: Present     Vaginal:  .     not performed. Patient declined  Cervix: Not evaluated        Extremities: Normal range of motion.     Mental Status: Normal mood and affect. Normal behavior. Normal judgment and thought content.   Urinalysis:      Assessment and Plan:  Pregnancy: [id number] at [redacted]w[redacted]d  Encounter for supervision of other normal pregnancy in third trimester -Fetal HR appropriate -Vertex presentation by Curlene Labrum -Reviewed labor precautions -NST scheduled for Wednesday -Plan to schedule 41 week induction at next appointment if appropriate.  Term labor symptoms and general obstetric precautions including but not limited to vaginal bleeding, contractions, leaking of fluid and fetal movement were reviewed in detail with the patient. Please refer to After Visit Summary for other counseling recommendations.  Return in about 1 week (around 06/10/2015) for Routine  prenatal care.   Janora Norlander, DO

## 2015-06-03 NOTE — Patient Instructions (Addendum)
Schedule follow up for next week.  NST is scheduled for Wednesday 7/27 @ 1045am at [facility].  Please make sure to be at this appointment on time.  Feel free to call with any questions or concerns.  Diane Harvey M. Lajuana Ripple, DO PGY-2, [facility]    Third Trimester of Pregnancy The third trimester is from week 29 through week 42, months 7 through 9. The third trimester is a time when the fetus is growing rapidly. At the end of the ninth month, the fetus is about 20 inches in length and weighs 6-10 pounds.  BODY CHANGES Your body goes through many changes during pregnancy. The changes vary from woman to woman.   Your weight will continue to increase. You can expect to gain 25-35 pounds (11-16 kg) by the end of the pregnancy.  You may begin to get stretch marks on your hips, abdomen, and breasts.  You may urinate more often because the fetus is moving lower into your pelvis and pressing on your bladder.  You may develop or continue to have heartburn as a result of your pregnancy.  You may develop constipation because certain hormones are causing the muscles that push waste through your intestines to slow down.  You may develop hemorrhoids or swollen, bulging veins (varicose veins).  You may have pelvic pain because of the weight gain and pregnancy hormones relaxing your joints between the bones in your pelvis. Backaches may result from overexertion of the muscles supporting your posture.  You may have changes in your hair. These can include thickening of your hair, rapid growth, and changes in texture. Some women also have hair loss during or after pregnancy, or hair that feels dry or thin. Your hair will most likely return to normal after your baby is born.  Your breasts will continue to grow and be tender. A yellow discharge may leak from your breasts called colostrum.  Your belly button may stick out.  You may feel short of breath because of your expanding uterus.  You  may notice the fetus "dropping," or moving lower in your abdomen.  You may have a bloody mucus discharge. This usually occurs a few days to a week before labor begins.  Your cervix becomes thin and soft (effaced) near your due date. WHAT TO EXPECT AT YOUR PRENATAL EXAMS  You will have prenatal exams every 2 weeks until week 36. Then, you will have weekly prenatal exams. During a routine prenatal visit:  You will be weighed to make sure you and the fetus are growing normally.  Your blood pressure is taken.  Your abdomen will be measured to track your baby's growth.  The fetal heartbeat will be listened to.  Any test results from the previous visit will be discussed.  You may have a cervical check near your due date to see if you have effaced. At around 36 weeks, your caregiver will check your cervix. At the same time, your caregiver will also perform a test on the secretions of the vaginal tissue. This test is to determine if a type of bacteria, Group B streptococcus, is present. Your caregiver will explain this further. Your caregiver may ask you:  What your birth plan is.  How you are feeling.  If you are feeling the baby move.  If you have had any abnormal symptoms, such as leaking fluid, bleeding, severe headaches, or abdominal cramping.  If you have any questions. Other tests or screenings that may be performed during your third trimester  include:  Blood tests that check for low iron levels (anemia).  Fetal testing to check the health, activity level, and growth of the fetus. Testing is done if you have certain medical conditions or if there are problems during the pregnancy. FALSE LABOR You may feel small, irregular contractions that eventually go away. These are called Braxton Hicks contractions, or false labor. Contractions may last for hours, days, or even weeks before true labor sets in. If contractions come at regular intervals, intensify, or become painful, it is best  to be seen by your caregiver.  SIGNS OF LABOR   Menstrual-like cramps.  Contractions that are 5 minutes apart or less.  Contractions that start on the top of the uterus and spread down to the lower abdomen and back.  A sense of increased pelvic pressure or back pain.  A watery or bloody mucus discharge that comes from the vagina. If you have any of these signs before the 37th week of pregnancy, call your caregiver right away. You need to go to the hospital to get checked immediately. HOME CARE INSTRUCTIONS   Avoid all smoking, herbs, alcohol, and unprescribed drugs. These chemicals affect the formation and growth of the baby.  Follow your caregiver's instructions regarding medicine use. There are medicines that are either safe or unsafe to take during pregnancy.  Exercise only as directed by your caregiver. Experiencing uterine cramps is a good sign to stop exercising.  Continue to eat regular, healthy meals.  Wear a good support bra for breast tenderness.  Do not use hot tubs, steam rooms, or saunas.  Wear your seat belt at all times when driving.  Avoid raw meat, uncooked cheese, cat litter boxes, and soil used by cats. These carry germs that can cause birth defects in the baby.  Take your prenatal vitamins.  Try taking a stool softener (if your caregiver approves) if you develop constipation. Eat more high-fiber foods, such as fresh vegetables or fruit and whole grains. Drink plenty of fluids to keep your urine clear or pale yellow.  Take warm sitz baths to soothe any pain or discomfort caused by hemorrhoids. Use hemorrhoid cream if your caregiver approves.  If you develop varicose veins, wear support hose. Elevate your feet for 15 minutes, 3-4 times a day. Limit salt in your diet.  Avoid heavy lifting, wear low heal shoes, and practice good posture.  Rest a lot with your legs elevated if you have leg cramps or low back pain.  Visit your dentist if you have not gone  during your pregnancy. Use a soft toothbrush to brush your teeth and be gentle when you floss.  A sexual relationship may be continued unless your caregiver directs you otherwise.  Do not travel far distances unless it is absolutely necessary and only with the approval of your caregiver.  Take prenatal classes to understand, practice, and ask questions about the labor and delivery.  Make a trial run to the hospital.  Pack your hospital bag.  Prepare the baby's nursery.  Continue to go to all your prenatal visits as directed by your caregiver. SEEK MEDICAL CARE IF:  You are unsure if you are in labor or if your water has broken.  You have dizziness.  You have mild pelvic cramps, pelvic pressure, or nagging pain in your abdominal area.  You have persistent nausea, vomiting, or diarrhea.  You have a bad smelling vaginal discharge.  You have pain with urination. SEEK IMMEDIATE MEDICAL CARE IF:   You have  a fever.  You are leaking fluid from your vagina.  You have spotting or bleeding from your vagina.  You have severe abdominal cramping or pain.  You have rapid weight loss or gain.  You have shortness of breath with chest pain.  You notice sudden or extreme swelling of your face, hands, ankles, feet, or legs.  You have not felt your baby move in over an hour.  You have severe headaches that do not go away with medicine.  You have vision changes. Document Released: 10/23/2001 Document Revised: 11/03/2013 Document Reviewed: 12/30/2012 [facility] Patient Information 2015 [place], [place]. This information is not intended to replace advice given to you by your health care provider. Make sure you discuss any questions you have with your health care provider. Braxton Hicks Contractions Contractions of the uterus can occur throughout pregnancy. Contractions are not always a sign that you are in labor.  WHAT ARE BRAXTON HICKS CONTRACTIONS?  Contractions that occur before labor  are called Braxton Hicks contractions, or false labor. Toward the end of pregnancy (32-34 weeks), these contractions can develop more often and may become more forceful. This is not true labor because these contractions do not result in opening (dilatation) and thinning of the cervix. They are sometimes difficult to tell apart from true labor because these contractions can be forceful and people have different pain tolerances. You should not feel embarrassed if you go to the hospital with false labor. Sometimes, the only way to tell if you are in true labor is for your health care provider to look for changes in the cervix. If there are no prenatal problems or other health problems associated with the pregnancy, it is completely safe to be sent home with false labor and await the onset of true labor. HOW CAN YOU TELL THE DIFFERENCE BETWEEN TRUE AND FALSE LABOR? False Labor  The contractions of false labor are usually shorter and not as hard as those of true labor.   The contractions are usually irregular.   The contractions are often felt in the front of the lower abdomen and in the groin.   The contractions may go away when you walk around or change positions while lying down.   The contractions get weaker and are shorter lasting as time goes on.   The contractions do not usually become progressively stronger, regular, and closer together as with true labor.  True Labor  Contractions in true labor last 30-70 seconds, become very regular, usually become more intense, and increase in frequency.   The contractions do not go away with walking.   The discomfort is usually felt in the top of the uterus and spreads to the lower abdomen and low back.   True labor can be determined by your health care provider with an exam. This will show that the cervix is dilating and getting thinner.  WHAT TO REMEMBER  Keep up with your usual exercises and follow other instructions given by your  health care provider.   Take medicines as directed by your health care provider.   Keep your regular prenatal appointments.   Eat and drink lightly if you think you are going into labor.   If Braxton Hicks contractions are making you uncomfortable:   Change your position from lying down or resting to walking, or from walking to resting.   Sit and rest in a tub of warm water.   Drink 2-3 glasses of water. Dehydration may cause these contractions.   Do slow and deep  breathing several times an hour.  WHEN SHOULD I SEEK IMMEDIATE MEDICAL CARE? Seek immediate medical care if:  Your contractions become stronger, more regular, and closer together.   You have fluid leaking or gushing from your vagina.   You have a fever.   You pass blood-tinged mucus.   You have vaginal bleeding.   You have continuous abdominal pain.   You have low back pain that you never had before.   You feel your baby's head pushing down and causing pelvic pressure.   Your baby is not moving as much as it used to.  Document Released: 10/29/2005 Document Revised: 11/03/2013 Document Reviewed: 08/10/2013 [facility] Patient Information 2015 [place], [place]. This information is not intended to replace advice given to you by your health care provider. Make sure you discuss any questions you have with your health care provider.

## 2015-06-07 ENCOUNTER — Other Ambulatory Visit: Payer: Self-pay | Admitting: Family Medicine

## 2015-06-07 DIAGNOSIS — O480 Post-term pregnancy: Secondary | ICD-10-CM

## 2015-06-08 ENCOUNTER — Encounter (HOSPITAL_COMMUNITY): Payer: Self-pay

## 2015-06-08 ENCOUNTER — Encounter (HOSPITAL_COMMUNITY): Payer: Self-pay | Admitting: *Deleted

## 2015-06-08 ENCOUNTER — Inpatient Hospital Stay (HOSPITAL_COMMUNITY)
Admission: AD | Admit: 2015-06-08 | Discharge: 2015-06-11 | DRG: 766 | Disposition: A | Discharge status: Home / Self Care | Payer: Medicaid Other | Source: Ambulatory Visit | Attending: Obstetrics & Gynecology | Admitting: Obstetrics & Gynecology

## 2015-06-08 ENCOUNTER — Inpatient Hospital Stay (HOSPITAL_COMMUNITY): Payer: Medicaid Other

## 2015-06-08 ENCOUNTER — Ambulatory Visit (HOSPITAL_COMMUNITY)
Admission: RE | Admit: 2015-06-08 | Discharge: 2015-06-08 | Disposition: A | Discharge status: Home / Self Care | Payer: Medicaid Other | Source: Ambulatory Visit | Attending: Family Medicine | Admitting: Family Medicine

## 2015-06-08 DIAGNOSIS — O0933 Supervision of pregnancy with insufficient antenatal care, third trimester: Secondary | ICD-10-CM | POA: Diagnosis not present

## 2015-06-08 DIAGNOSIS — O99214 Obesity complicating childbirth: Secondary | ICD-10-CM | POA: Diagnosis present

## 2015-06-08 DIAGNOSIS — K649 Unspecified hemorrhoids: Secondary | ICD-10-CM

## 2015-06-08 DIAGNOSIS — O0930 Supervision of pregnancy with insufficient antenatal care, unspecified trimester: Secondary | ICD-10-CM

## 2015-06-08 DIAGNOSIS — IMO0002 Reserved for concepts with insufficient information to code with codable children: Secondary | ICD-10-CM | POA: Insufficient documentation

## 2015-06-08 DIAGNOSIS — Z3A40 40 weeks gestation of pregnancy: Secondary | ICD-10-CM | POA: Diagnosis present

## 2015-06-08 DIAGNOSIS — O368390 Maternal care for abnormalities of the fetal heart rate or rhythm, unspecified trimester, not applicable or unspecified: Secondary | ICD-10-CM

## 2015-06-08 DIAGNOSIS — O480 Post-term pregnancy: Secondary | ICD-10-CM

## 2015-06-08 DIAGNOSIS — Z833 Family history of diabetes mellitus: Secondary | ICD-10-CM | POA: Diagnosis not present

## 2015-06-08 DIAGNOSIS — Z6839 Body mass index (BMI) 39.0-39.9, adult: Secondary | ICD-10-CM

## 2015-06-08 DIAGNOSIS — Z8249 Family history of ischemic heart disease and other diseases of the circulatory system: Secondary | ICD-10-CM

## 2015-06-08 DIAGNOSIS — O09523 Supervision of elderly multigravida, third trimester: Secondary | ICD-10-CM | POA: Diagnosis not present

## 2015-06-08 LAB — TYPE AND SCREEN
ABO/RH(D): O POS
Antibody Screen: NEGATIVE

## 2015-06-08 LAB — CBC
HCT: 35.1 % — ABNORMAL LOW (ref 36.0–46.0)
Hemoglobin: 11.2 g/dL — ABNORMAL LOW (ref 12.0–15.0)
MCH: 23.3 pg — ABNORMAL LOW (ref 26.0–34.0)
MCHC: 31.9 g/dL (ref 30.0–36.0)
MCV: 73 fL — ABNORMAL LOW (ref 78.0–100.0)
Platelets: 286 10*3/uL (ref 150–400)
RBC: 4.81 MIL/uL (ref 3.87–5.11)
RDW: 18.3 % — AB (ref 11.5–15.5)
WBC: 6.9 10*3/uL (ref 4.0–10.5)

## 2015-06-08 MED ORDER — MISOPROSTOL 25 MCG QUARTER TABLET
25.0000 ug | ORAL_TABLET | ORAL | Status: DC
  Administered 2015-06-08 (×2): 25 ug via VAGINAL
  Filled 2015-06-08: qty 1
  Filled 2015-06-08: qty 0.25
  Filled 2015-06-08 (×3): qty 1
  Filled 2015-06-08: qty 0.25
  Filled 2015-06-08: qty 1

## 2015-06-08 MED ORDER — OXYTOCIN 40 UNITS IN LACTATED RINGERS INFUSION - SIMPLE MED: 62.5000 mL/h | INTRAVENOUS | Status: DC

## 2015-06-08 MED ORDER — OXYCODONE-ACETAMINOPHEN 5-325 MG PO TABS: 1.0000 | ORAL_TABLET | ORAL | Status: DC | PRN

## 2015-06-08 MED ORDER — CITRIC ACID-SODIUM CITRATE 334-500 MG/5ML PO SOLN
30.0000 mL | ORAL | Status: DC | PRN
  Administered 2015-06-09: 30 mL via ORAL
  Filled 2015-06-08: qty 15

## 2015-06-08 MED ORDER — ACETAMINOPHEN 325 MG PO TABS
650.0000 mg | ORAL_TABLET | ORAL | Status: DC | PRN
Start: 2015-06-08 — End: 2015-06-09

## 2015-06-08 MED ORDER — LACTATED RINGERS IV SOLN
INTRAVENOUS | Status: DC
  Administered 2015-06-08 – 2015-06-09 (×3): via INTRAVENOUS

## 2015-06-08 MED ORDER — OXYCODONE-ACETAMINOPHEN 5-325 MG PO TABS: 2.0000 | ORAL_TABLET | ORAL | Status: DC | PRN

## 2015-06-08 MED ORDER — FLEET ENEMA 7-19 GM/118ML RE ENEM: 1.0000 | ENEMA | RECTAL | Status: DC | PRN

## 2015-06-08 MED ORDER — OXYTOCIN BOLUS FROM INFUSION: 500.0000 mL | INTRAVENOUS | Status: DC

## 2015-06-08 MED ORDER — ONDANSETRON HCL 4 MG/2ML IJ SOLN
4.0000 mg | Freq: Four times a day (QID) | INTRAMUSCULAR | Status: DC | PRN
  Administered 2015-06-09: 4 mg via INTRAVENOUS

## 2015-06-08 MED ORDER — LACTATED RINGERS IV SOLN
500.0000 mL | INTRAVENOUS | Status: DC | PRN
  Administered 2015-06-09 (×2): 500 mL via INTRAVENOUS
  Administered 2015-06-09 (×3): 1000 mL via INTRAVENOUS

## 2015-06-08 MED ORDER — LIDOCAINE HCL (PF) 1 % IJ SOLN
30.0000 mL | INTRAMUSCULAR | Status: DC | PRN
  Filled 2015-06-08: qty 30

## 2015-06-08 NOTE — H&P (Signed)
Diane Harvey is a 34 y.o. female Pt sent to [facility] from [facility] due to decels when receiving NST in clinic. She is w/o complaint and reports no recurrent contractions/LOF/VB. She states that her pregnancy has been uncomplicated and that she is on no medications. All of her prior deliveries were induced 2/2 post-dates, so she is familiar w/ IOL. She has noticed no change in fetal movement. She was monitored for several hours with a reassuring NST in MAU and was to be d/c'd home w/ instructions to return for IOL on 7/28, but had another decel prior to d/c. She is now being admitted for IOL. History OB History    Gravida Para Term Preterm AB TAB SAB Ectopic Multiple Living   4 3 3       3      Past Medical History  Diagnosis Date  . Knee pain 07/31/2011    See orthopedist for this.  Unknown name.  Has tried injections with good results.  Plans on returning.    Past Surgical History  Procedure Laterality Date  . Carpal tunnel release     Family History: family history includes Cancer in her paternal grandfather; Diabetes in her father; Hyperlipidemia in her father; Hypertension in her father and mother. There is no history of Heart disease or Stroke. Social History:  reports that she has never smoked. She has never used smokeless tobacco. She reports that she does not drink alcohol or use illicit drugs.   [facility]; friend  Pap 09/26/2012; negative w/ High risk HPV  GC/CT Initial:                36+wks:   NEG  Genetic Screen NT/IT:   CF screen Normal  Anatomic [place] Heart not well visualized. Rpt needed  Flu vaccine Declined  Glucose Screen  Normal 1hr gtt  GBS  Urine: negative  Feed Preference Bottle  Contraception BTL, signed consent but then changed to DEPO  Circumcision Girl  Childbirth Classes Declines  Pediatrician [facility]     Prenatal Transfer Tool  Maternal Diabetes: No Genetic Screening: Normal Maternal Ultrasounds/Referrals: Abnormal:  Findings:    Other: Heart not well visualized Fetal Ultrasounds or other Referrals:  None Maternal Substance Abuse:  No Significant Maternal Medications:  None Significant Maternal Lab Results:  None Other Comments:  None  ROS  Respiratory: Negative for shortness of breath.  Cardiovascular: Negative for chest pain and leg swelling.  Gastrointestinal: Negative for nausea, vomiting, diarrhea and constipation.  Genitourinary: Negative for urgency and frequency.  Neurological: Negative for headaches.   Dilation: Fingertip Effacement (%): Thick Exam by:: L. Paschal, RN Blood pressure 126/84, pulse 73, temperature 97.6 F (36.4 C), resp. rate 18, height 5\' 7"  (1.702 m), last menstrual period 08/31/2014. Exam Physical Exam  Constitutional: She is oriented to person, place, and time. She appears well-developed and well-nourished. No distress.  HENT:  Head: Normocephalic and atraumatic.  Eyes: Conjunctivae and EOM are normal.  Neck: Normal range of motion. No tracheal deviation present.  Cardiovascular: Normal rate, regular rhythm and normal heart sounds.  Respiratory: Effort normal and breath sounds normal. No respiratory distress.  GI: Soft. She exhibits no distension and no mass. There is no tenderness. There is no rebound and no guarding.  Genitourinary: Vagina normal and uterus normal.  Musculoskeletal: Normal range of motion.  Neurological: She is alert and oriented to person, place, and time.  Skin: Skin is warm and dry. She is not diaphoretic.  Psychiatric: She has a normal mood and affect. Her behavior is normal. Judgment and thought content normal.  Cervix 0.5/thick  Prenatal labs: ABO, Rh: O/POS/-- (02/16 1350) Antibody: NEG (02/16 1350) Rubella: 2.16 (02/16 1350) RPR: NON REAC (05/26 0909)  HBsAg: NEGATIVE (02/16 1350)  HIV: NONREACTIVE (05/26 0909)  GBS: NOT DETECTED (07/01 1440)   Assessment/Plan: Pt is a 34 yo f G4P3003 at [redacted]w[redacted]d who presented to [facility] from [facility] after  having late decels on NST. She had another in MAU.  #will admit w/ standard labor orders for IOL and decels #will monitor strip closely #will begin w/ cytotec for cervical ripening once on L&D floor   Reyes Ivan, MD 06/08/2015, 4:07 PM

## 2015-06-08 NOTE — MAU Provider Note (Signed)
History     CSN: [id number]  Arrival date and time: 06/08/15 1232   First Provider Initiated Contact with Patient 06/08/15 1432      Chief Complaint  Patient presents with  . Non-stress Test   HPI Comments: Pt sent to MAU from MFM due to decels when receiving NST in clinic. She is w/o complaint and reports no recurrent contractions/LOF/VB. She states that her pregnancy has been uncomplicated and that she is on no medications. All of her prior deliveries were induced 2/2 post-dates, so she is familiar w/ IOL. She has noticed no change in fetal movement.   OB History    Gravida Para Term Preterm AB TAB SAB Ectopic Multiple Living   4 3 3       3       Past Medical History  Diagnosis Date  . Knee pain 07/31/2011    See orthopedist for this.  Unknown name.  Has tried injections with good results.  Plans on returning.     Past Surgical History  Procedure Laterality Date  . Carpal tunnel release      Family History  Problem Relation Age of Onset  . Hypertension Mother   . Hypertension Father   . Diabetes Father   . Hyperlipidemia Father   . Cancer Paternal Grandfather     throat?  . Heart disease Neg Hx   . Stroke Neg Hx     History  Substance Use Topics  . Smoking status: Never Smoker   . Smokeless tobacco: Never Used  . Alcohol Use: No    Allergies: No Known Allergies  Facility-administered medications prior to admission  Medication Dose Route Frequency Provider Last Rate Last Dose  . medroxyPROGESTERone (DEPO-PROVERA) injection 150 mg  150 mg Intramuscular See admin instructions Katherina Mires, MD       Prescriptions prior to admission  Medication Sig Dispense Refill Last Dose  . acetaminophen (TYLENOL) 500 MG tablet Take 500 mg by mouth every 6 (six) hours as needed for headache.   Past Week at Unknown time  . Prenatal Vit-Fe Fumarate-FA (PRENATAL VITAMIN PO) Take 1 tablet by mouth every other day.    06/06/2015    Review of Systems  Respiratory: Negative  for shortness of breath.   Cardiovascular: Negative for chest pain and leg swelling.  Gastrointestinal: Negative for nausea, vomiting, diarrhea and constipation.  Genitourinary: Negative for urgency and frequency.  Neurological: Negative for headaches.   Physical Exam   Blood pressure 126/84, pulse 73, temperature 97.6 F (36.4 C), resp. rate 18, height 5\' 7"  (1.702 m), last menstrual period 08/31/2014.  Physical Exam  Constitutional: She is oriented to person, place, and time. She appears well-developed and well-nourished. No distress.  HENT:  Head: Normocephalic and atraumatic.  Eyes: Conjunctivae and EOM are normal.  Neck: Normal range of motion. No tracheal deviation present.  Cardiovascular: Normal rate, regular rhythm and normal heart sounds.   Respiratory: Effort normal and breath sounds normal. No respiratory distress.  GI: Soft. She exhibits no distension and no mass. There is no tenderness. There is no rebound and no guarding.  Genitourinary: Vagina normal and uterus normal.  Musculoskeletal: Normal range of motion.  Neurological: She is alert and oriented to person, place, and time.  Skin: Skin is warm and dry. She is not diaphoretic.  Psychiatric: She has a normal mood and affect. Her behavior is normal. Judgment and thought content normal.  Cervix 0.5/thick  MAU Course  Procedures  MDM Cervical Exam  Assessment and Plan  Pt is a 34 yo f G4P3003 at [redacted]w[redacted]d who presented to MAU from [facility] clinic 2/2 to decels x2 during NST.  #On the monitor in the MAU, pt's strip has been Cat I w/o abnormality; FHR has been in the 130s w/ 15x15 acels and no decels. #Pt discussed w/ Dr. Ernestina Patches and we agree w/ MFM that pt should be induced, but given reassuring strip and non-recurrent decels, we feel that IOL in her case is not emergently needed and she could be scheduled to return for this. #we have scheduled her induction for 0700 on 7/28 and pt has been instructed #pt was counseled on  the risk of stillbirth and given instructions for Fetal kick counts (>10/hr twice this afternoon and evening); labor precautions given for return to MAU if pt has LOF/VB/contractions/decrease fetal movement  Diane Harvey 06/08/2015, 2:57 PM

## 2015-06-08 NOTE — Progress Notes (Signed)
Labor Progress Note  S: comfortable, not feeling ctx  O:  BP 114/81 mmHg  Pulse 98  Temp(Src) 98.5 F (36.9 C) (Oral)  Resp 18  Ht 5\' 7"  (1.702 m)  Wt 113.853 kg (251 lb)  BMI 39.30 kg/m2  LMP 08/31/2014 (Exact Date) FHR: baseline 135, moderate variability, +15x15 accels, variable and non-recurrent late decel Toco: very irregular CVE: Dilation: Fingertip Effacement (%): Thick Cervical Position: Posterior Station: -3 Presentation: Vertex Exam by:: ansah-mensah, rnc   A&P: 34 y.o. A4T3646 [redacted]w[redacted]d IOL 2/2 decels  #labor: continue to augment with cytotec #FWB: Cat II --will continue to monitor closely   Melina Schools, DO 10:36 PM

## 2015-06-08 NOTE — MAU Note (Signed)
Pt sent from MFM for decels and to be admitted.

## 2015-06-08 NOTE — ED Notes (Signed)
Report called to Heywood Bene, RN.  Pt to MAU for evaluation.

## 2015-06-09 ENCOUNTER — Inpatient Hospital Stay (HOSPITAL_COMMUNITY): Payer: Medicaid Other | Admitting: Anesthesiology

## 2015-06-09 ENCOUNTER — Encounter (HOSPITAL_COMMUNITY): Payer: Self-pay | Admitting: *Deleted

## 2015-06-09 ENCOUNTER — Encounter (HOSPITAL_COMMUNITY)
Admission: AD | Disposition: A | Discharge status: Home / Self Care | Payer: Self-pay | Source: Ambulatory Visit | Attending: Obstetrics & Gynecology

## 2015-06-09 ENCOUNTER — Inpatient Hospital Stay (HOSPITAL_COMMUNITY): Payer: Medicaid Other

## 2015-06-09 DIAGNOSIS — O99214 Obesity complicating childbirth: Secondary | ICD-10-CM

## 2015-06-09 DIAGNOSIS — Z3A40 40 weeks gestation of pregnancy: Secondary | ICD-10-CM

## 2015-06-09 LAB — RPR: RPR: NONREACTIVE

## 2015-06-09 LAB — ABO/RH: ABO/RH(D): O POS

## 2015-06-09 SURGERY — Surgical Case
Anesthesia: Epidural

## 2015-06-09 MED ORDER — SODIUM BICARBONATE 8.4 % IV SOLN
INTRAVENOUS | Status: DC | PRN
  Administered 2015-06-09: 2 mL via EPIDURAL
  Administered 2015-06-09 (×2): 5 mL via EPIDURAL

## 2015-06-09 MED ORDER — MENTHOL 3 MG MT LOZG: 1.0000 | LOZENGE | OROMUCOSAL | Status: DC | PRN

## 2015-06-09 MED ORDER — WITCH HAZEL-GLYCERIN EX PADS
1.0000 | MEDICATED_PAD | CUTANEOUS | Status: DC | PRN
Start: 2015-06-09 — End: 2015-06-11

## 2015-06-09 MED ORDER — LACTATED RINGERS IV SOLN
INTRAVENOUS | Status: DC | PRN
  Administered 2015-06-09 (×2): via INTRAVENOUS

## 2015-06-09 MED ORDER — HYDROMORPHONE HCL 1 MG/ML IJ SOLN: 0.2500 mg | INTRAMUSCULAR | Status: DC | PRN

## 2015-06-09 MED ORDER — BUPIVACAINE HCL (PF) 0.5 % IJ SOLN
INTRAMUSCULAR | Status: DC | PRN
  Administered 2015-06-09: 30 mL

## 2015-06-09 MED ORDER — DIPHENHYDRAMINE HCL 25 MG PO CAPS: 25.0000 mg | ORAL_CAPSULE | Freq: Four times a day (QID) | ORAL | Status: DC | PRN

## 2015-06-09 MED ORDER — METHYLPREDNISOLONE SODIUM SUCC 125 MG IJ SOLR
INTRAMUSCULAR | Status: AC
  Filled 2015-06-09: qty 2

## 2015-06-09 MED ORDER — DIPHENHYDRAMINE HCL 25 MG PO CAPS: 25.0000 mg | ORAL_CAPSULE | ORAL | Status: DC | PRN

## 2015-06-09 MED ORDER — NALOXONE HCL 0.4 MG/ML IJ SOLN: 0.4000 mg | INTRAMUSCULAR | Status: DC | PRN

## 2015-06-09 MED ORDER — OXYCODONE-ACETAMINOPHEN 5-325 MG PO TABS
2.0000 | ORAL_TABLET | ORAL | Status: DC | PRN
  Administered 2015-06-10: 2 via ORAL
  Filled 2015-06-09 (×2): qty 2

## 2015-06-09 MED ORDER — PRENATAL MULTIVITAMIN CH
1.0000 | ORAL_TABLET | Freq: Every day | ORAL | Status: DC
  Administered 2015-06-10 – 2015-06-11 (×2): 1 via ORAL
  Filled 2015-06-09 (×2): qty 1

## 2015-06-09 MED ORDER — METHYLERGONOVINE MALEATE 0.2 MG/ML IJ SOLN
INTRAMUSCULAR | Status: AC
Start: 2015-06-09 — End: 2015-06-09
  Filled 2015-06-09: qty 1

## 2015-06-09 MED ORDER — MEPERIDINE HCL 25 MG/ML IJ SOLN: 6.2500 mg | INTRAMUSCULAR | Status: DC | PRN

## 2015-06-09 MED ORDER — IBUPROFEN 600 MG PO TABS: 600.0000 mg | ORAL_TABLET | Freq: Four times a day (QID) | ORAL | Status: DC | PRN

## 2015-06-09 MED ORDER — DIPHENHYDRAMINE HCL 50 MG/ML IJ SOLN: 12.5000 mg | INTRAMUSCULAR | Status: DC | PRN

## 2015-06-09 MED ORDER — SCOPOLAMINE 1 MG/3DAYS TD PT72
MEDICATED_PATCH | TRANSDERMAL | Status: DC | PRN
  Administered 2015-06-09: 1 via TRANSDERMAL

## 2015-06-09 MED ORDER — FENTANYL 2.5 MCG/ML BUPIVACAINE 1/10 % EPIDURAL INFUSION (WH - ANES)
14.0000 mL/h | INTRAMUSCULAR | Status: DC | PRN
  Administered 2015-06-09: 14 mL/h via EPIDURAL
  Filled 2015-06-09: qty 125

## 2015-06-09 MED ORDER — LACTATED RINGERS IV SOLN
INTRAVENOUS | Status: DC
  Administered 2015-06-09: 300 mL via INTRAUTERINE

## 2015-06-09 MED ORDER — DEXTROSE 5 % IV SOLN: 1.0000 ug/kg/h | INTRAVENOUS | Status: DC | PRN

## 2015-06-09 MED ORDER — DEXTROSE 5 % IV SOLN
3.0000 g | INTRAVENOUS | Status: DC | PRN
  Administered 2015-06-09: 3 g via INTRAVENOUS

## 2015-06-09 MED ORDER — SIMETHICONE 80 MG PO CHEW: 80.0000 mg | CHEWABLE_TABLET | ORAL | Status: DC | PRN

## 2015-06-09 MED ORDER — LANOLIN HYDROUS EX OINT: 1.0000 "application " | TOPICAL_OINTMENT | CUTANEOUS | Status: DC | PRN

## 2015-06-09 MED ORDER — LACTATED RINGERS IV SOLN: INTRAVENOUS | Status: DC

## 2015-06-09 MED ORDER — NALBUPHINE HCL 10 MG/ML IJ SOLN
5.0000 mg | INTRAMUSCULAR | Status: DC | PRN
Start: 2015-06-09 — End: 2015-06-11

## 2015-06-09 MED ORDER — DIBUCAINE 1 % RE OINT: 1.0000 "application " | TOPICAL_OINTMENT | RECTAL | Status: DC | PRN

## 2015-06-09 MED ORDER — MISOPROSTOL 200 MCG PO TABS
ORAL_TABLET | ORAL | Status: AC
Start: 2015-06-09 — End: 2015-06-09
  Filled 2015-06-09: qty 4

## 2015-06-09 MED ORDER — MAGNESIUM HYDROXIDE 400 MG/5ML PO SUSP: 30.0000 mL | ORAL | Status: DC | PRN

## 2015-06-09 MED ORDER — SENNOSIDES-DOCUSATE SODIUM 8.6-50 MG PO TABS
2.0000 | ORAL_TABLET | ORAL | Status: DC
  Administered 2015-06-10 (×2): 2 via ORAL
  Filled 2015-06-09 (×2): qty 2

## 2015-06-09 MED ORDER — TETANUS-DIPHTH-ACELL PERTUSSIS 5-2.5-18.5 LF-MCG/0.5 IM SUSP: 0.5000 mL | Freq: Once | INTRAMUSCULAR | Status: DC

## 2015-06-09 MED ORDER — ONDANSETRON HCL 4 MG/2ML IJ SOLN: 4.0000 mg | Freq: Three times a day (TID) | INTRAMUSCULAR | Status: DC | PRN

## 2015-06-09 MED ORDER — PROMETHAZINE HCL 25 MG/ML IJ SOLN: 6.2500 mg | INTRAMUSCULAR | Status: DC | PRN

## 2015-06-09 MED ORDER — EPHEDRINE 5 MG/ML INJ: 10.0000 mg | INTRAVENOUS | Status: DC | PRN

## 2015-06-09 MED ORDER — LIDOCAINE-EPINEPHRINE (PF) 2 %-1:200000 IJ SOLN
INTRAMUSCULAR | Status: AC
  Filled 2015-06-09: qty 20

## 2015-06-09 MED ORDER — OXYCODONE-ACETAMINOPHEN 5-325 MG PO TABS
1.0000 | ORAL_TABLET | ORAL | Status: DC | PRN
  Administered 2015-06-10 – 2015-06-11 (×3): 1 via ORAL
  Filled 2015-06-09 (×3): qty 1

## 2015-06-09 MED ORDER — NALBUPHINE HCL 10 MG/ML IJ SOLN: 5.0000 mg | Freq: Once | INTRAMUSCULAR | Status: AC | PRN

## 2015-06-09 MED ORDER — OXYTOCIN 10 UNIT/ML IJ SOLN
INTRAMUSCULAR | Status: AC
  Filled 2015-06-09: qty 4

## 2015-06-09 MED ORDER — DEXTROSE 5 % IV SOLN
INTRAVENOUS | Status: AC
  Filled 2015-06-09: qty 3000

## 2015-06-09 MED ORDER — NALBUPHINE HCL 10 MG/ML IJ SOLN
5.0000 mg | Freq: Once | INTRAMUSCULAR | Status: AC | PRN
Start: 2015-06-09 — End: 2015-06-09

## 2015-06-09 MED ORDER — ZOLPIDEM TARTRATE 5 MG PO TABS
5.0000 mg | ORAL_TABLET | Freq: Every evening | ORAL | Status: DC | PRN
  Administered 2015-06-09: 5 mg via ORAL
  Filled 2015-06-09: qty 1

## 2015-06-09 MED ORDER — FENTANYL CITRATE (PF) 100 MCG/2ML IJ SOLN
100.0000 ug | INTRAMUSCULAR | Status: DC | PRN
  Administered 2015-06-09 (×5): 100 ug via INTRAVENOUS
  Filled 2015-06-09 (×5): qty 2

## 2015-06-09 MED ORDER — MEASLES, MUMPS & RUBELLA VAC ~~LOC~~ INJ: 0.5000 mL | INJECTION | Freq: Once | SUBCUTANEOUS | Status: DC

## 2015-06-09 MED ORDER — LACTATED RINGERS IV SOLN
INTRAVENOUS | Status: DC | PRN
  Administered 2015-06-09: 16:00:00 via INTRAVENOUS

## 2015-06-09 MED ORDER — OXYTOCIN 10 UNIT/ML IJ SOLN
40.0000 [IU] | INTRAVENOUS | Status: DC | PRN
  Administered 2015-06-09: 40 [IU] via INTRAVENOUS

## 2015-06-09 MED ORDER — SODIUM CHLORIDE 0.9 % IJ SOLN: 3.0000 mL | INTRAMUSCULAR | Status: DC | PRN

## 2015-06-09 MED ORDER — IBUPROFEN 600 MG PO TABS
600.0000 mg | ORAL_TABLET | Freq: Four times a day (QID) | ORAL | Status: DC
  Administered 2015-06-10 – 2015-06-11 (×7): 600 mg via ORAL
  Filled 2015-06-09 (×7): qty 1

## 2015-06-09 MED ORDER — ZOLPIDEM TARTRATE 5 MG PO TABS: 5.0000 mg | ORAL_TABLET | Freq: Every evening | ORAL | Status: DC | PRN

## 2015-06-09 MED ORDER — ACETAMINOPHEN 500 MG PO TABS
1000.0000 mg | ORAL_TABLET | Freq: Four times a day (QID) | ORAL | Status: AC
  Administered 2015-06-10 (×2): 1000 mg via ORAL
  Filled 2015-06-09 (×3): qty 2

## 2015-06-09 MED ORDER — ONDANSETRON HCL 4 MG/2ML IJ SOLN
INTRAMUSCULAR | Status: AC
  Filled 2015-06-09: qty 2

## 2015-06-09 MED ORDER — KETOROLAC TROMETHAMINE 30 MG/ML IJ SOLN: 30.0000 mg | Freq: Four times a day (QID) | INTRAMUSCULAR | Status: DC | PRN

## 2015-06-09 MED ORDER — PHENYLEPHRINE 40 MCG/ML (10ML) SYRINGE FOR IV PUSH (FOR BLOOD PRESSURE SUPPORT)
80.0000 ug | PREFILLED_SYRINGE | INTRAVENOUS | Status: DC | PRN
  Administered 2015-06-09: 80 ug via INTRAVENOUS
  Administered 2015-06-09: 16:00:00 via INTRAVENOUS
  Filled 2015-06-09: qty 20

## 2015-06-09 MED ORDER — SIMETHICONE 80 MG PO CHEW
80.0000 mg | CHEWABLE_TABLET | ORAL | Status: DC
  Administered 2015-06-10 (×2): 80 mg via ORAL
  Filled 2015-06-09 (×2): qty 1

## 2015-06-09 MED ORDER — SODIUM BICARBONATE 8.4 % IV SOLN
INTRAVENOUS | Status: AC
  Filled 2015-06-09: qty 50

## 2015-06-09 MED ORDER — OXYTOCIN 40 UNITS IN LACTATED RINGERS INFUSION - SIMPLE MED
1.0000 m[IU]/min | INTRAVENOUS | Status: DC
  Administered 2015-06-09: 1 m[IU]/min via INTRAVENOUS
  Filled 2015-06-09: qty 1000

## 2015-06-09 MED ORDER — NALBUPHINE HCL 10 MG/ML IJ SOLN: 5.0000 mg | INTRAMUSCULAR | Status: DC | PRN

## 2015-06-09 MED ORDER — SCOPOLAMINE 1 MG/3DAYS TD PT72: 1.0000 | MEDICATED_PATCH | Freq: Once | TRANSDERMAL | Status: DC

## 2015-06-09 MED ORDER — KETOROLAC TROMETHAMINE 30 MG/ML IJ SOLN: 30.0000 mg | Freq: Once | INTRAMUSCULAR | Status: DC | PRN

## 2015-06-09 MED ORDER — MORPHINE SULFATE 0.5 MG/ML IJ SOLN
INTRAMUSCULAR | Status: AC
  Filled 2015-06-09: qty 10

## 2015-06-09 MED ORDER — ACETAMINOPHEN 325 MG PO TABS: 650.0000 mg | ORAL_TABLET | ORAL | Status: DC | PRN

## 2015-06-09 MED ORDER — TERBUTALINE SULFATE 1 MG/ML IJ SOLN
0.2500 mg | Freq: Once | INTRAMUSCULAR | Status: AC | PRN
  Administered 2015-06-09: 0.25 mg via SUBCUTANEOUS
  Filled 2015-06-09: qty 1

## 2015-06-09 MED ORDER — OXYTOCIN 40 UNITS IN LACTATED RINGERS INFUSION - SIMPLE MED: 62.5000 mL/h | INTRAVENOUS | Status: AC

## 2015-06-09 MED ORDER — METHYLERGONOVINE MALEATE 0.2 MG/ML IJ SOLN
INTRAMUSCULAR | Status: DC | PRN
  Administered 2015-06-09 (×2): 0.2 mg via INTRAMUSCULAR

## 2015-06-09 MED ORDER — MORPHINE SULFATE (PF) 0.5 MG/ML IJ SOLN
INTRAMUSCULAR | Status: DC | PRN
  Administered 2015-06-09: 4 mg via EPIDURAL
  Administered 2015-06-09: 1 mg via INTRAVENOUS

## 2015-06-09 MED ORDER — LIDOCAINE HCL (PF) 1 % IJ SOLN
INTRAMUSCULAR | Status: DC | PRN
  Administered 2015-06-09 (×2): 8 mL via EPIDURAL

## 2015-06-09 MED ORDER — DEXAMETHASONE SODIUM PHOSPHATE 10 MG/ML IJ SOLN
INTRAMUSCULAR | Status: DC | PRN
  Administered 2015-06-09: 10 mg via INTRAVENOUS

## 2015-06-09 MED ORDER — MISOPROSTOL 25 MCG QUARTER TABLET
ORAL_TABLET | ORAL | Status: DC | PRN
  Administered 2015-06-09: 800 ug

## 2015-06-09 SURGICAL SUPPLY — 34 items
APL SKNCLS STERI-STRIP NONHPOA (GAUZE/BANDAGES/DRESSINGS) ×1
BENZOIN TINCTURE PRP APPL 2/3 (GAUZE/BANDAGES/DRESSINGS) ×2 IMPLANT
CLAMP CORD UMBIL (MISCELLANEOUS) IMPLANT
CLOTH BEACON ORANGE TIMEOUT ST (SAFETY) ×2 IMPLANT
DRAPE SHEET LG 3/4 BI-LAMINATE (DRAPES) IMPLANT
DRSG OPSITE POSTOP 4X10 (GAUZE/BANDAGES/DRESSINGS) ×2 IMPLANT
DURAPREP 26ML APPLICATOR (WOUND CARE) ×2 IMPLANT
ELECT REM PT RETURN 9FT ADLT (ELECTROSURGICAL) ×2
ELECTRODE REM PT RTRN 9FT ADLT (ELECTROSURGICAL) ×1 IMPLANT
EXTRACTOR VACUUM KIWI (MISCELLANEOUS) IMPLANT
GLOVE BIO SURGEON STRL SZ7 (GLOVE) ×2 IMPLANT
GLOVE BIOGEL PI IND STRL 7.0 (GLOVE) ×1 IMPLANT
GLOVE BIOGEL PI INDICATOR 7.0 (GLOVE) ×1
GOWN STRL REUS W/TWL LRG LVL3 (GOWN DISPOSABLE) ×4 IMPLANT
KIT ABG SYR 3ML LUER SLIP (SYRINGE) IMPLANT
NDL HYPO 25X5/8 SAFETYGLIDE (NEEDLE) IMPLANT
NEEDLE HYPO 22GX1.5 SAFETY (NEEDLE) ×2 IMPLANT
NEEDLE HYPO 25X5/8 SAFETYGLIDE (NEEDLE) IMPLANT
NS IRRIG 1000ML POUR BTL (IV SOLUTION) ×2 IMPLANT
PACK C SECTION WH (CUSTOM PROCEDURE TRAY) ×2 IMPLANT
PAD OB MATERNITY 4.3X12.25 (PERSONAL CARE ITEMS) ×2 IMPLANT
RTRCTR C-SECT PINK 25CM LRG (MISCELLANEOUS) IMPLANT
SPONGE SURGIFOAM ABS GEL 12-7 (HEMOSTASIS) IMPLANT
STRIP CLOSURE SKIN 1/2X4 (GAUZE/BANDAGES/DRESSINGS) ×2 IMPLANT
SUT PDS AB 0 CTX 60 (SUTURE) IMPLANT
SUT PLAIN 0 NONE (SUTURE) IMPLANT
SUT SILK 0 TIES 10X30 (SUTURE) IMPLANT
SUT VIC AB 0 CT1 36 (SUTURE) ×8 IMPLANT
SUT VIC AB 3-0 CT1 27 (SUTURE) ×2
SUT VIC AB 3-0 CT1 TAPERPNT 27 (SUTURE) ×1 IMPLANT
SUT VIC AB 4-0 KS 27 (SUTURE) IMPLANT
SYR CONTROL 10ML LL (SYRINGE) ×2 IMPLANT
TOWEL OR 17X24 6PK STRL BLUE (TOWEL DISPOSABLE) ×2 IMPLANT
TRAY FOLEY CATH SILVER 14FR (SET/KITS/TRAYS/PACK) ×2 IMPLANT

## 2015-06-09 NOTE — Progress Notes (Signed)
POC for urgent C/S initated.

## 2015-06-09 NOTE — Progress Notes (Signed)
Patient ID: Diane Harvey, female   DOB: 22-Mar-1981, 34 y.o.   MRN: [id number] Diane Harvey is a 34 y.o. [phone] at [redacted]w[redacted]d.  Subjective: Called to evaluate patient with repetitive deep variable decelerations  Objective: BP 113/57 mmHg  Pulse 106  Temp(Src) 97.8 F (36.6 C) (Oral)  Resp 18  Ht 5\' 7"  (1.702 m)  Wt 251 lb (113.853 kg)  BMI 39.30 kg/m2  SpO2 97%  LMP 08/31/2014 (Exact Date)   FHT:  FHR: 135 bpm, variability: mod,  accelerations:  10x10,  decelerations:  Deed variables down to 90s lasting 1.5 - 2 minutes, repetitive UC:   Q 2? Min, mod to strong.  Dilation: 6.5 Effacement (%): 70 Cervical Position: Middle Station: 0 Presentation: Vertex Exam by:: Carter Kitten RNC  Labs: Results for orders placed or performed during the hospital encounter of 06/08/15 (from the past 24 hour(s))  CBC     Status: Abnormal   Collection Time: 06/08/15  7:45 PM  Result Value Ref Range   WBC 6.9 4.0 - 10.5 K/uL   RBC 4.81 3.87 - 5.11 MIL/uL   Hemoglobin 11.2 (L) 12.0 - 15.0 g/dL   HCT 35.1 (L) 36.0 - 46.0 %   MCV 73.0 (L) 78.0 - 100.0 fL   MCH 23.3 (L) 26.0 - 34.0 pg   MCHC 31.9 30.0 - 36.0 g/dL   RDW 18.3 (H) 11.5 - 15.5 %   Platelets 286 150 - 400 K/uL  Type and screen     Status: None   Collection Time: 06/08/15  7:45 PM  Result Value Ref Range   ABO/RH(D) O POS    Antibody Screen NEG    Sample Expiration 06/11/2015   RPR     Status: None   Collection Time: 06/08/15  7:45 PM  Result Value Ref Range   RPR Ser Ql Non Reactive Non Reactive  ABO/Rh     Status: None   Collection Time: 06/08/15  7:45 PM  Result Value Ref Range   ABO/RH(D) O POS     Assessment / Plan: [redacted]w[redacted]d week IUP with persistent nonreassuring fetal heart rate pattern, remote from vaginal delivery. Cesarean delivery recommended.  The risks of cesarean section discussed with the patient included but were not limited to: bleeding which may require transfusion or reoperation; infection which may require antibiotics;  injury to bowel, bladder, ureters or other surrounding organs; injury to the fetus; need for additional procedures including hysterectomy in the event of a life-threatening hemorrhage; placental abnormalities wth subsequent pregnancies, incisional problems, thromboembolic phenomenon and other postoperative/anesthesia complications. The patient concurred with the proposed plan, giving informed written consent for the procedure.   Anesthesia and OR aware. Preoperative prophylactic antibiotics and SCDs ordered on call to the OR.  To OR when ready.    Osborne Oman, MD 06/09/2015 3:45 PM

## 2015-06-09 NOTE — Progress Notes (Signed)
Anesthesia at bedside

## 2015-06-09 NOTE — Anesthesia Preprocedure Evaluation (Addendum)
Anesthesia Evaluation  Patient identified by MRN, date of birth, ID band Patient awake    Reviewed: Allergy & Precautions, H&P , NPO status , Patient's Chart, lab work & pertinent test results  Airway Mallampati: II  TM Distance: >3 FB Neck ROM: full    Dental no notable dental hx.    Pulmonary neg pulmonary ROS,    Pulmonary exam normal       Cardiovascular negative cardio ROS Normal cardiovascular exam    Neuro/Psych negative psych ROS   GI/Hepatic negative GI ROS, Neg liver ROS,   Endo/Other  Morbid obesity  Renal/GU negative Renal ROS     Musculoskeletal   Abdominal (+) + obese,   Peds  Hematology negative hematology ROS (+)   Anesthesia Other Findings   Reproductive/Obstetrics (+) Pregnancy                             Anesthesia Physical Anesthesia Plan  ASA: III  Anesthesia Plan: Epidural   Post-op Pain Management:    Induction:   Airway Management Planned:   Additional Equipment:   Intra-op Plan:   Post-operative Plan:   Informed Consent: I have reviewed the patients History and Physical, chart, labs and discussed the procedure including the risks, benefits and alternatives for the proposed anesthesia with the patient or authorized representative who has indicated his/her understanding and acceptance.     Plan Discussed with:   Anesthesia Plan Comments: (For C/S with epidural in-situ and dosed .)       Anesthesia Quick Evaluation

## 2015-06-09 NOTE — Progress Notes (Signed)
Patient ID: Diane Harvey, female   DOB: 1980-12-05, 34 y.o.   MRN: [id number] Diane Harvey is a 34 y.o. [phone] at [redacted]w[redacted]d.  Subjective: Uncomfortable, but Pain controlled w/ Fentanyl. Plan epidural in labor.   Objective: BP 139/86 mmHg  Pulse 77  Temp(Src) 97.7 F (36.5 C) (Oral)  Resp 20  Ht 5\' 7"  (1.702 m)  Wt 251 lb (113.853 kg)  BMI 39.30 kg/m2  LMP 08/31/2014 (Exact Date)   FHT:  FHR: 130 bpm, variability: mod,  accelerations:  10x10,  decelerations:  none UC:   Q 2 minutes, mild-mod  Dilation: 4 Effacement (%): Thick Cervical Position: Posterior Station: Ballotable Presentation: Vertex confirmed by BS [place].  Exam by:: v. Brianda Beitler cnm  Foley bulb out  Labs: Results for orders placed or performed during the hospital encounter of 06/08/15 (from the past 24 hour(s))  CBC     Status: Abnormal   Collection Time: 06/08/15  7:45 PM  Result Value Ref Range   WBC 6.9 4.0 - 10.5 K/uL   RBC 4.81 3.87 - 5.11 MIL/uL   Hemoglobin 11.2 (L) 12.0 - 15.0 g/dL   HCT 35.1 (L) 36.0 - 46.0 %   MCV 73.0 (L) 78.0 - 100.0 fL   MCH 23.3 (L) 26.0 - 34.0 pg   MCHC 31.9 30.0 - 36.0 g/dL   RDW 18.3 (H) 11.5 - 15.5 %   Platelets 286 150 - 400 K/uL  Type and screen     Status: None   Collection Time: 06/08/15  7:45 PM  Result Value Ref Range   ABO/RH(D) O POS    Antibody Screen NEG    Sample Expiration 06/11/2015   RPR     Status: None   Collection Time: 06/08/15  7:45 PM  Result Value Ref Range   RPR Ser Ql Non Reactive Non Reactive  ABO/Rh     Status: None   Collection Time: 06/08/15  7:45 PM  Result Value Ref Range   ABO/RH(D) O POS     Assessment / Plan: [redacted]w[redacted]d week IUP Labor: Early Fetal Wellbeing:  Category I Pain Control:  Fentanyl Anticipated MOD:  SVD  Manya Silvas, CNM 06/09/2015 11:04 AM

## 2015-06-09 NOTE — Op Note (Signed)
[facility] PROCEDURE DATE: 06/09/2015  PREOPERATIVE DIAGNOSES: Intrauterine pregnancy at [redacted]w[redacted]d weeks gestation; non-reassuring fetal status with repetitive deep variable decelerations  POSTOPERATIVE DIAGNOSES: The same  PROCEDURE: Primary Low Transverse Cesarean Section  SURGEON:  Dr. Verita Schneiders  ASSISTANT:  Dr. Lavonia Drafts, Dr. Lauretta Chester  INDICATIONS: Diane Harvey is a 34 y.o. [phone] at [redacted]w[redacted]d here for cesarean section secondary to the indications listed under preoperative diagnoses; please see preoperative note for further details.  The risks of cesarean section were discussed with the patient including but were not limited to: bleeding which may require transfusion or reoperation; infection which may require antibiotics; injury to bowel, bladder, ureters or other surrounding organs; injury to the fetus; need for additional procedures including hysterectomy in the event of a life-threatening hemorrhage; placental abnormalities wth subsequent pregnancies, incisional problems, thromboembolic phenomenon and other postoperative/anesthesia complications.   The patient concurred with the proposed plan, giving informed written consent for the procedure.    FINDINGS:  Viable female infant in cephalic presentation.  Apgars 8 and 9. One body and one nuchal cord. Clear amniotic fluid.  Intact placenta, three vessel cord.  Normal uterus, fallopian tubes and ovaries bilaterally. There was a large extension of the hysterotomy over the right side and downwards, repaired in usual fashion.  ANESTHESIA: Epidural INTRAVENOUS FLUIDS: 2000 ml ESTIMATED BLOOD LOSS: 750 ml URINE OUTPUT:  100 ml SPECIMENS: Placenta sent to pathology COMPLICATIONS: None immediate  PROCEDURE IN DETAIL:  The patient preoperatively received intravenous antibiotics and had sequential compression devices applied to her lower extremities.  She was then taken to the operating room where the epidural anesthesia was  dosed up to surgical level and was found to be adequate. She was then placed in a dorsal supine position with a leftward tilt, and prepped and draped in a sterile manner.  A foley catheter was placed into her bladder and attached to constant gravity.  After an adequate timeout was performed, a Pfannenstiel skin incision was made with scalpel and carried through to the underlying layer of fascia. The fascia was incised in the midline, and this incision was extended bilaterally using the Mayo scissors.  The rectus muscles were separated in the midline bluntly and the peritoneum was entered bluntly. Attention was turned to the lower uterine segment where a low transverse hysterotomy was made with a scalpel and extended bilaterally bluntly.  The infant was successfully delivered with some difficulty given the low station of the fetal head, the cord was clamped and cut and the infant was handed over to awaiting neonatology team. Uterine massage was then administered, and the placenta delivered intact with a three-vessel cord. The uterus was then cleared of clot and debris.  The hysterotomy extension was recognized and was closed with rest of hysterotomy with 0 Vicryl in a running locked fashion, and an imbricating layer was also placed with 0 Vicryl.   The pelvis was cleared of all clot and debris. Hemostasis was confirmed on all surfaces.  The peritoneum and the muscles were reapproximated using 0 Vicryl interrupted stitches. The fascia was then closed using 0 Vicryl in a running fashion.  The subcutaneous layer was irrigated, then reapproximated with 2-0 plain gut running stitch, and 30 ml of 0.5% Marcaine was injected subcutaneously around the incision.  The skin was closed with a 4-0 Vicryl subcuticular stitch. The patient tolerated the procedure well. Sponge, lap, instrument and needle counts were correct x 3.  She was taken to the recovery room in stable  condition.    Verita Schneiders, MD, [place] Attending  [place], [facility]

## 2015-06-09 NOTE — Progress Notes (Signed)
LABOR PROGRESS NOTE  Diane Harvey is a 34 y.o. [phone] at [redacted]w[redacted]d  admitted for IOL 2/2 post dates and Non reassuring NST  Subjective: Patient reports that she is doing ok.  She reports that pain has improved since placement of epidural.  No concerns at this time.  Objective: BP 123/78 mmHg  Pulse 73  Temp(Src) 97.8 F (36.6 C) (Oral)  Resp 20  Ht 5\' 7"  (1.702 m)  Wt 251 lb (113.853 kg)  BMI 39.30 kg/m2  SpO2 97%  LMP 08/31/2014 (Exact Date) or  Filed Vitals:   06/09/15 1325 06/09/15 1329 06/09/15 1330 06/09/15 1334  BP: 131/82 123/78    Pulse: 68 72 68 73  Temp:      TempSrc:      Resp:      Height:      Weight:      SpO2:  98%  97%   Fetal monitoringBaseline: 145 bpm, Variability: Good {> 6 bpm), Accelerations: 10x10 and Decelerations: Variable: mild Uterine activityFrequency: irregular  Dilation: 4 Effacement (%): Thick Cervical Position: Posterior Station: Ballotable Presentation: Vertex Exam by:: brewer rnc  Labs: Lab Results  Component Value Date   WBC 6.9 06/08/2015   HGB 11.2* 06/08/2015   HCT 35.1* 06/08/2015   MCV 73.0* 06/08/2015   PLT 286 06/08/2015    Patient Active Problem List   Diagnosis Date Noted  . Delivery normal 06/08/2015  . Post-term pregnancy, 40-42 weeks of gestation   . Late deceleration of fetal heart rate   . Hemorrhoid 05/20/2015  . Supervision of other normal pregnancy 04/07/2015  . Advanced maternal age in multigravida   . Encounter for fetal anatomic survey   . Late prenatal care 01/17/2015  . Incidental pregnancy 11/03/2014  . Bilateral knee pain 06/22/2014  . Cervical high risk HPV (human papillomavirus) test positive 06/22/2014  . Carpal tunnel syndrome 10/27/2013  . Obesity 09/26/2012  . Paronychia 07/11/2012  . Contraception 07/31/2011    Assessment / Plan: 34 y.o. G4P3003 at [redacted]w[redacted]d here for IOL.  Foley bulb is out.  Patient on pit.    Labor: IOL as above Fetal Wellbeing:  Cat 1 Pain Control:  Epidural in  place Anticipated MOD:  NSVD  Appreciate the excellent care being provided by the [facility] Teaching service to our patient.  Ronnie Doss, DO 06/09/2015, 2:01 PM

## 2015-06-09 NOTE — Progress Notes (Signed)
Patient ID: Diane Harvey, female   DOB: 1980-12-30, 34 y.o.   MRN: [id number] Diane Harvey is a 34 y.o. [phone] at [redacted]w[redacted]d.  Subjective: Comfortable w/ epidural.   Objective: BP 123/78 mmHg  Pulse 73  Temp(Src) 97.8 F (36.6 C) (Oral)  Resp 20  Ht 5\' 7"  (1.702 m)  Wt 251 lb (113.853 kg)  BMI 39.30 kg/m2  SpO2 97%  LMP 08/31/2014 (Exact Date)   FHT:  FHR: 135 bpm, variability: mod,  accelerations:  10x10,  decelerations:  Mild, occasional moderate variables, improved w/ position changes.  UC:   Q 2? Min, mod to strong. Some difficulty tracing. Adjusted multiple times. Minutes. Dilation: 5 Effacement (%): Thick Cervical Position: Middle Station: -2 Presentation: Vertex Exam by:: V Arsen Mangione CNM AROM small-mod amount of bloody fluid. IUPC placed w/out difficulty. Fluid in IUPC lumen clear.  Labs: Results for orders placed or performed during the hospital encounter of 06/08/15 (from the past 24 hour(s))  CBC     Status: Abnormal   Collection Time: 06/08/15  7:45 PM  Result Value Ref Range   WBC 6.9 4.0 - 10.5 K/uL   RBC 4.81 3.87 - 5.11 MIL/uL   Hemoglobin 11.2 (L) 12.0 - 15.0 g/dL   HCT 35.1 (L) 36.0 - 46.0 %   MCV 73.0 (L) 78.0 - 100.0 fL   MCH 23.3 (L) 26.0 - 34.0 pg   MCHC 31.9 30.0 - 36.0 g/dL   RDW 18.3 (H) 11.5 - 15.5 %   Platelets 286 150 - 400 K/uL  Type and screen     Status: None   Collection Time: 06/08/15  7:45 PM  Result Value Ref Range   ABO/RH(D) O POS    Antibody Screen NEG    Sample Expiration 06/11/2015   RPR     Status: None   Collection Time: 06/08/15  7:45 PM  Result Value Ref Range   RPR Ser Ql Non Reactive Non Reactive  ABO/Rh     Status: None   Collection Time: 06/08/15  7:45 PM  Result Value Ref Range   ABO/RH(D) O POS     Assessment / Plan: [redacted]w[redacted]d week IUP Labor: early Fetal Wellbeing:  Category II Pain Control:  epidural Anticipated MOD:  SVD CTO FHR tracing and bleeding closely.   [place], CNM 06/09/2015 2:28 PM

## 2015-06-09 NOTE — Anesthesia Postprocedure Evaluation (Signed)
Anesthesia Post Note  Patient: Diane Harvey  Procedure(s) Performed: Procedure(s) (LRB): CESAREAN SECTION (N/A)  Anesthesia type: Epidural  Patient location: PACU  Post pain: Pain level controlled  Post assessment: Post-op Vital signs reviewed  Last Vitals:  Filed Vitals:   06/09/15 1815  BP: 102/73  Pulse: 86  Temp: 37.2 C  Resp: 19    Post vital signs: Reviewed  Level of consciousness: awake  Complications: No apparent anesthesia complications

## 2015-06-09 NOTE — Transfer of Care (Signed)
Immediate Anesthesia Transfer of Care Note  Patient: Diane Harvey  Procedure(s) Performed: Procedure(s): CESAREAN SECTION (N/A)  Patient Location: PACU  Anesthesia Type:Epidural  Level of Consciousness: awake  Airway & Oxygen Therapy: Patient Spontanous Breathing  Post-op Assessment: Report given to RN and Post -op Vital signs reviewed and stable  Post vital signs: stable  Last Vitals:  Filed Vitals:   06/09/15 1500  BP: 113/57  Pulse: 106  Temp:   Resp: 18    Complications: No apparent anesthesia complications

## 2015-06-09 NOTE — Anesthesia Procedure Notes (Signed)
Epidural Patient location during procedure: OB Start time: 06/09/2015 12:58 PM End time: 06/09/2015 1:02 PM  Staffing Anesthesiologist: Lyn Hollingshead Performed by: anesthesiologist   Preanesthetic Checklist Completed: patient identified, surgical consent, pre-op evaluation, timeout performed, IV checked, risks and benefits discussed and monitors and equipment checked  Epidural Patient position: sitting Prep: site prepped and draped and DuraPrep Patient monitoring: continuous pulse ox and blood pressure Approach: midline Location: L3-L4 Injection technique: LOR air  Needle:  Needle type: Tuohy  Needle gauge: 17 G Needle length: 9 cm and 9 Needle insertion depth: 7 cm Catheter type: closed end flexible Catheter size: 19 Gauge Catheter at skin depth: 12 cm Test dose: negative and Other  Assessment Sensory level: T9 Events: blood not aspirated, injection not painful, no injection resistance, negative IV test and no paresthesia  Additional Notes Reason for block:procedure for pain

## 2015-06-09 NOTE — Progress Notes (Signed)
This note also relates to the following rows which could not be included: BP - Cannot attach notes to unvalidated device data Pulse Rate - Cannot attach notes to unvalidated device data Dose (milli-units/min) Oxytocin - Cannot attach notes to extension rows Rate (mL/hr) Oxytocin - Cannot attach notes to extension rows Concentration Oxytocin - Cannot attach notes to extension rows   Marlou Porch CNM aware of UC's being q61mins and feels that uc;s are not strong will start Pitocin at 73mu /68mu

## 2015-06-09 NOTE — Progress Notes (Signed)
Diane Harvey is a 34 y.o. [phone] at [redacted]w[redacted]d  admitted for induction of labor due to Non-reactive NST.  Subjective: Feeling well; no complaints; s/p cytotec x 2 doses  Objective: BP 124/69 mmHg  Pulse 63  Temp(Src) 98 F (36.7 C) (Oral)  Resp 18  Ht 5\' 7"  (1.702 m)  Wt 113.853 kg (251 lb)  BMI 39.30 kg/m2  LMP 08/31/2014 (Exact Date)      FHT:  FHR: 130-140 bpm, variability: minimal ,  accelerations:  Abscent,  decelerations:  Present FHR w/ variable/early decels, no pattern UC:   irreg SVE:   Dilation: 1 Effacement (%): Thick Station: -3 Exam by:: Diane Harvey, rnc  Labs: Lab Results  Component Value Date   WBC 6.9 06/08/2015   HGB 11.2* 06/08/2015   HCT 35.1* 06/08/2015   MCV 73.0* 06/08/2015   PLT 286 06/08/2015    Assessment / Plan: IOL process FHR changes Unfavorable cx  Foley bulb inserted without difficulty  Semaje Kinker CNM 06/09/2015, 6:20 AM

## 2015-06-10 ENCOUNTER — Encounter: Payer: Medicaid Other | Admitting: Family Medicine

## 2015-06-10 ENCOUNTER — Encounter (HOSPITAL_COMMUNITY): Payer: Self-pay | Admitting: Family Medicine

## 2015-06-10 LAB — CBC
HCT: 28.8 % — ABNORMAL LOW (ref 36.0–46.0)
HEMOGLOBIN: 9.3 g/dL — AB (ref 12.0–15.0)
MCH: 23.5 pg — AB (ref 26.0–34.0)
MCHC: 32.3 g/dL (ref 30.0–36.0)
MCV: 72.9 fL — ABNORMAL LOW (ref 78.0–100.0)
PLATELETS: 241 10*3/uL (ref 150–400)
RBC: 3.95 MIL/uL (ref 3.87–5.11)
RDW: 18.6 % — AB (ref 11.5–15.5)
WBC: 20.6 10*3/uL — ABNORMAL HIGH (ref 4.0–10.5)

## 2015-06-10 NOTE — Progress Notes (Signed)
Post Op Day 1  Subjective:  Diane Harvey is a 34 y.o. G4P4001 [redacted]w[redacted]d s/p pLTCS.  No acute events overnight.  Pt denies problems with ambulating, voiding or po intake.  She denies nausea or vomiting.  Pain is well controlled.  She has not had flatus. She has not had bowel movement.  Lochia Moderate.  Plan for birth control is Depo-Provera.  Method of Feeding: Bottle.  Baby is doing well.  Patient voices no concerns at this time.  Objective: Blood pressure 111/61, pulse 77, temperature 97.7 F (36.5 C), temperature source Oral, resp. rate 18, height 5\' 7"  (1.702 m), weight 251 lb (113.853 kg), last menstrual period 08/31/2014, SpO2 98 %, unknown if currently breastfeeding.  Physical Exam:  General: alert, cooperative and no distress Lochia:normal flow (about period amount) Chest: CTAB, no increased WOB Heart: RRR, no m/r/g, +2 DP Abdomen: +BS, soft, nontender Uterine Fundus: firm, fundus just below the umbilicus DVT Evaluation: No evidence of DVT seen on physical exam. Extremities: cool but well perfused, no edema   Recent Labs  06/08/15 1945 06/10/15 0530  HGB 11.2* 9.3*  HCT 35.1* 28.8*    Assessment/Plan:  ASSESSMENT: Diane Harvey is a 34 y.o. G4P4001 [redacted]w[redacted]d s/p pLTCS. WBC 20.6 this morning.  Patient has been afebrile and exhibits no tenderness upon palpation of uterus.  Leukocytosis likely post surgica/laborl etiology.  Plan for discharge tomorrow and Contraception Depo  Repeat CBC in am.   LOS: 2 days   Ronnie Doss, DO 06/10/2015, 7:31 AM   I have seen and examined this patient and agree the above assessment.  Respiratory effort normal, lochia appropriate, legs negative,  pain level normal.  CRESENZO-DISHMAN,Callaghan Laverdure 06/10/2015 7:50 AM

## 2015-06-10 NOTE — Progress Notes (Signed)
Called by RN for repeated soaking through honeycomb dressing after excessive movement Plan to place honeycomb with pressure dressing. Will inspect incision tomorrow. If this becomes soaked will assess.   Caren Macadam, MD

## 2015-06-10 NOTE — Addendum Note (Signed)
Addendum  created 06/10/15 0750 by Tobin Chad, CRNA   Modules edited: Notes Section   Notes Section:  File: [id number]

## 2015-06-10 NOTE — Progress Notes (Signed)
Dressing 3/4 saturated with blood, notified Dr. Zenia Resides. Dr. Ernestina Patches came to see patient and ordered dressing to be changed.

## 2015-06-10 NOTE — Progress Notes (Signed)
UR chart review completed.  

## 2015-06-10 NOTE — Anesthesia Postprocedure Evaluation (Signed)
  Anesthesia Post-op Note  Patient: Diane Harvey  Procedure(s) Performed: Procedure(s): CESAREAN SECTION (N/A)  Patient Location: Mother/Baby  Anesthesia Type:Epidural  Level of Consciousness: awake, alert , oriented and patient cooperative  Airway and Oxygen Therapy: Patient Spontanous Breathing  Post-op Pain: none  Post-op Assessment: Post-op Vital signs reviewed, Patient's Cardiovascular Status Stable, Respiratory Function Stable, Patent Airway, No signs of Nausea or vomiting, Adequate PO intake, Pain level controlled, No headache, No backache, Spinal receding and Patient able to bend at knees LLE Motor Response: Non-purposeful movement LLE Sensation: Tingling RLE Motor Response: Non-purposeful movement RLE Sensation: Increased, Tingling L Sensory Level: S1-Sole of foot, small toes R Sensory Level: L5-Outer lower leg, top of foot, great toe  Post-op Vital Signs: Reviewed and stable  Last Vitals:  Filed Vitals:   06/10/15 0626  BP: 111/61  Pulse: 77  Temp: 36.5 C  Resp: 18    Complications: No apparent anesthesia complications

## 2015-06-11 LAB — CBC
HCT: 26.2 % — ABNORMAL LOW (ref 36.0–46.0)
HEMOGLOBIN: 8.2 g/dL — AB (ref 12.0–15.0)
MCH: 23 pg — AB (ref 26.0–34.0)
MCHC: 31.3 g/dL (ref 30.0–36.0)
MCV: 73.4 fL — ABNORMAL LOW (ref 78.0–100.0)
Platelets: 186 10*3/uL (ref 150–400)
RBC: 3.57 MIL/uL — ABNORMAL LOW (ref 3.87–5.11)
RDW: 18.6 % — ABNORMAL HIGH (ref 11.5–15.5)
WBC: 13.8 10*3/uL — ABNORMAL HIGH (ref 4.0–10.5)

## 2015-06-11 MED ORDER — IBUPROFEN 600 MG PO TABS: 600.0000 mg | ORAL_TABLET | Freq: Four times a day (QID) | ORAL | Status: DC

## 2015-06-11 MED ORDER — OXYCODONE-ACETAMINOPHEN 5-325 MG PO TABS: 1.0000 | ORAL_TABLET | ORAL | Status: DC | PRN

## 2015-06-11 MED ORDER — FERROUS SULFATE 325 (65 FE) MG PO TABS: 325.0000 mg | ORAL_TABLET | Freq: Two times a day (BID) | ORAL | Status: DC

## 2015-06-11 NOTE — Discharge Summary (Signed)
Obstetric Discharge Summary Reason for Admission: induction of labor Prenatal Procedures: NST Intrapartum Procedures: cesarean: low cervical, transverse Postpartum Procedures: none Complications-Operative and Postpartum: none HEMOGLOBIN  Date Value Ref Range Status  06/11/2015 8.2* 12.0 - 15.0 g/dL Final   HCT  Date Value Ref Range Status  06/11/2015 26.2* 36.0 - 46.0 % Final    Physical Exam:  General: alert and cooperative Lochia: appropriate Uterine Fundus: firm Incision: healing well, no significant drainage, no dehiscence, no significant erythema DVT Evaluation: No evidence of DVT seen on physical exam. Negative Homan's sign. No significant calf/ankle edema.  Discharge Diagnoses: Term Pregnancy-delivered  Discharge Information: Date: 06/11/2015 Activity: pelvic rest Diet: routine Medications: PNV and Ibuprofen Condition: stable Instructions: refer to practice specific booklet Discharge to: home Follow-up Information    Follow up with Ronnie Doss, DO. Go on 07/19/2015.   Specialty:  Family Medicine   Why:  9:00am (postpartum f/u)   Contact information:   [address] [place] [address] [phone]       Newborn Data: Live born female  Birth Weight: 5 lb 7.1 oz (2470 g) APGAR: 8, 9  Home with mother.  Cordelia Poche 06/11/2015, 7:31 AM   I spoke with and examined patient and agree with resident/PA/SNM's note and plan of care.  She was admitted at 40.1wks from MFM d/t variables during NST. She received cytotec, then cervical foley bulb, and pitocin per protocol, and at 6.5cm was noted to be having recurrent deep variables, c/s was recommended d/t being remote from delivery, so PLTCS was performed. Postpartum she soaked through 2 honeycomb dressing and had pressure dressing applied on POD#1 pm, thereafter drainage stopped. Hgb 10-> 8 this am, asymptomatic and pt wants to be d/c'd today, will send home on fe bid. She is eating, drinking, voiding,  ambulating well.  +flatus.  Lochia and pain wnl.  Denies dizziness, lightheadedness, or sob. No complaints.  Bottlefeeding Plans for depo, understands abstinence until then F/U w/ [facility] in 4-6wks for postpartum visit Roma Schanz, CNM, [facility] 06/11/2015 10:12 AM

## 2015-06-11 NOTE — Discharge Instructions (Signed)
You have elected for Depo-provera for birth control. Please take extra iron supplementation since your blood count is low. If you have symptoms of incision bleeding, pain, redness, please return. If you develop lightheadedness/dizziness, please follow-up sooner at our clinic. Otherwise, please follow up with your doctor in 6 weeks for follow up.  Non-Breastfeeding Wear a well-fitting bra for support.  Use ice packs to relieve discomfort from engorgement.  Avoid handling your breasts and do not express milk.  Non-breastfeeding engorgement will subside in 24-36 hours. Uterine Changes After pains, or cramping, are normal. This cramping means that the uterus is contracting to return to its non-pregnant size. The uterus takes 5-6 weeks to return to its non-pregnant size. Vaginal Discharge Usually lasts about 10 days to 4 weeks. The color will change from bright red to brownish to tan and will become less in amount and finally disappear.  Menstruation: your period will resume in approximately 6-8 weeks, unless breastfeeding. Activity Rest! Do not do heavy housework or heavy exercise for two weeks. Avoid driving for 1-2 weeks. Check with your doctor for limitations on activities if you have had a C-Section.  Avoid sexual activity, douching or tampons until your postpartum visit.  Constipation (sometimes the iron supplement can cause constipation)  Drink plenty of fluid, preferably water, throughout the day  Eat foods high in fiber such as fruits, vegetables, and grains  Exercise, such as walking, is a good way to keep your bowels regular  Drink warm fluids, especially warm prune juice, or decaf coffee  Eat a 1/2 cup of real oatmeal (not instant), 1/2 cup applesauce, and 1/2-1 cup warm prune juice every day  If needed, you may take Colace (docusate sodium) stool softener once or twice a day to help keep the stool soft. If you are pregnant, wait until you are out of your first trimester (12-14  weeks of pregnancy)  If you still are having problems with constipation, you may take Miralax once daily as needed to help keep your bowels regular.  If you are pregnant, wait until you are out of your first trimester (12-14 weeks of pregnancy)    Postpartum Care After Cesarean Delivery After you deliver your newborn (postpartum period), the usual stay in the hospital is 24-72 hours. If there were problems with your labor or delivery, or if you have other medical problems, you might be in the hospital longer.  While you are in the hospital, you will receive help and instructions on how to care for yourself and your newborn during the postpartum period.  While you are in the hospital: 8. It is normal for you to have pain or discomfort from the incision in your abdomen. Be sure to tell your nurses when you are having pain, where the pain is located, and what makes the pain worse. 9. If you are breastfeeding, you may feel uncomfortable contractions of your uterus for a couple of weeks. This is normal. The contractions help your uterus get back to normal size. 10. It is normal to have some bleeding after delivery. 1. For the first 1-3 days after delivery, the flow is red and the amount may be similar to a period. 2. It is common for the flow to start and stop. 3. In the first few days, you may pass some small clots. Let your nurses know if you begin to pass large clots or your flow increases. 4. Do not  flush blood clots down the toilet before having the nurse look at them.  5. During the next 3-10 days after delivery, your flow should become more watery and pink or brown-tinged in color. 6. Ten to fourteen days after delivery, your flow should be a small amount of yellowish-white discharge. 7. The amount of your flow will decrease over the first few weeks after delivery. Your flow may stop in 6-8 weeks. Most women have had their flow stop by 12 weeks after delivery. 11. You should change your sanitary  pads frequently. 41. Wash your hands thoroughly with soap and water for at least 20 seconds after changing pads, using the toilet, or before holding or feeding your newborn. 13. Your intravenous (IV) tubing will be removed when you are drinking enough fluids. 14. The urine drainage tube (urinary catheter) that was inserted before delivery may be removed within 6-8 hours after delivery or when feeling returns to your legs. You should feel like you need to empty your bladder within the first 6-8 hours after the catheter has been removed. 15. In case you become weak, lightheaded, or faint, call your nurse before you get out of bed for the first time and before you take a shower for the first time. 16. Within the first few days after delivery, your breasts may begin to feel tender and full. This is called engorgement. Breast tenderness usually goes away within 48-72 hours after engorgement occurs. You may also notice milk leaking from your breasts. If you are not breastfeeding, do not stimulate your breasts. Breast stimulation can make your breasts produce more milk. 52. Spending as much time as possible with your newborn is very important. During this time, you and your newborn can feel close and get to know each other. Having your newborn stay in your room (rooming in) will help to strengthen the bond with your newborn. It will give you time to get to know your newborn and become comfortable caring for your newborn. 18. Your hormones change after delivery. Sometimes the hormone changes can temporarily cause you to feel sad or tearful. These feelings should not last more than a few days. If these feelings last longer than that, you should talk to your caregiver. 19. If desired, talk to your caregiver about methods of family planning or contraception. 20. Talk to your caregiver about immunizations. Your caregiver may want you to have the following immunizations before leaving the hospital: 1. Tetanus,  diphtheria, and pertussis (Tdap) or tetanus and diphtheria (Td) immunization. It is very important that you and your family (including grandparents) or others caring for your newborn are up-to-date with the Tdap or Td immunizations. The Tdap or Td immunization can help protect your newborn from getting ill. 2. Rubella immunization. 3. Varicella (chickenpox) immunization. 4. Influenza immunization. You should receive this annual immunization if you did not receive the immunization during your pregnancy. Document Released: 07/23/2012 Document Reviewed: 07/23/2012 [facility] Patient Information 2015 [employer]. This information is not intended to replace advice given to you by your health care provider. Make sure you discuss any questions you have with your health care provider.

## 2015-06-27 ENCOUNTER — Other Ambulatory Visit: Payer: Self-pay | Admitting: Family Medicine

## 2015-06-27 NOTE — Telephone Encounter (Signed)
Will forward to MD.  Patient was given this pain medication post delivery. Diane Harvey,CMA

## 2015-06-27 NOTE — Telephone Encounter (Signed)
Pt called and would like a refill on her Oxycodone left up front for pick up. jw

## 2015-07-19 ENCOUNTER — Ambulatory Visit (INDEPENDENT_AMBULATORY_CARE_PROVIDER_SITE_OTHER): Payer: Medicaid Other | Admitting: Family Medicine

## 2015-07-19 ENCOUNTER — Encounter: Payer: Self-pay | Admitting: Family Medicine

## 2015-07-19 DIAGNOSIS — R87810 Cervical high risk human papillomavirus (HPV) DNA test positive: Secondary | ICD-10-CM

## 2015-07-19 DIAGNOSIS — Z3009 Encounter for other general counseling and advice on contraception: Secondary | ICD-10-CM

## 2015-07-19 DIAGNOSIS — K5909 Other constipation: Secondary | ICD-10-CM

## 2015-07-19 MED ORDER — NORGESTIM-ETH ESTRAD TRIPHASIC 0.18/0.215/0.25 MG-35 MCG PO TABS
1.0000 | ORAL_TABLET | Freq: Every day | ORAL | Status: DC
Start: 2015-07-19 — End: 2016-07-02

## 2015-07-19 MED ORDER — POLYETHYLENE GLYCOL 3350 17 GM/SCOOP PO POWD: 17.0000 g | Freq: Two times a day (BID) | ORAL | Status: DC | PRN

## 2015-07-19 NOTE — Patient Instructions (Signed)
Cut Iron back to 1 tablet daily.  Take Miralax twice daily mixed in 8-16 ounces of liquid.  You may decrease or increase by 1 capful daily if needed.  Plan to see me back in 1 week for pap smear and CBC.  If you have any questions do not hesistate to call at [phone]  [facility]. Diane Ripple, DO PGY-2, [facility] Ethinyl Estradiol; Norgestimate tablets What is this medicine? ETHINYL ESTRADIOL; NORGESTIMATE (ETH in il es tra DYE ole; nor JES ti mate) is an oral contraceptive. The products combine two types of female hormones, an estrogen and a progestin. They are used to prevent ovulation and pregnancy. Some products are also used to treat acne in females. This medicine may be used for other purposes; ask your health care provider or pharmacist if you have questions. COMMON BRAND NAME(S): Estarylla, MONO-LINYAH, MonoNessa, Ortho Tri-Cyclen, Ortho Tri-Cyclen Lo, Ortho-Cyclen, Previfem, Sprintec, Tri-Estarylla, TRI-LINYAH, Tri-Lo-Sprintec, Tri-Previfem, Tri-Sprintec, Bertram Millard What should I tell my health care provider before I take this medicine? They need to know if you have or ever had any of these conditions: -abnormal vaginal bleeding -blood vessel disease or blood clots -breast, cervical, endometrial, ovarian, liver, or uterine cancer -diabetes -gallbladder disease -heart disease or recent heart attack -high blood pressure -high cholesterol -kidney disease -liver disease -migraine headaches -stroke -systemic lupus erythematosus (SLE) -tobacco smoker -an unusual or allergic reaction to estrogens, progestins, other medicines, foods, dyes, or preservatives -pregnant or trying to get pregnant -breast-feeding How should I use this medicine? Take this medicine by mouth. To reduce nausea, this medicine may be taken with food. Follow the directions on the prescription label. Take this medicine at the same time each day and in the order directed on the package. Do not take your  medicine more often than directed. Contact your pediatrician regarding the use of this medicine in children. Special care may be needed. This medicine has been used in female children who have started having menstrual periods. A patient package insert for the product will be given with each prescription and refill. Read this sheet carefully each time. The sheet may change frequently. Overdosage: If you think you have taken too much of this medicine contact a poison control center or emergency room at once. NOTE: This medicine is only for you. Do not share this medicine with others. What if I miss a dose? If you miss a dose, refer to the patient information sheet you received with your medicine for direction. If you miss more than one pill, this medicine may not be as effective and you may need to use another form of birth control. What may interact with this medicine? -acetaminophen -antibiotics or medicines for infections, especially rifampin, rifabutin, rifapentine, and griseofulvin, and possibly penicillins or tetracyclines -aprepitant -ascorbic acid (vitamin C) -atorvastatin -barbiturate medicines, such as phenobarbital -bosentan -carbamazepine -caffeine -clofibrate -cyclosporine -dantrolene -doxercalciferol -felbamate -grapefruit juice -hydrocortisone -medicines for anxiety or sleeping problems, such as diazepam or temazepam -medicines for diabetes, including pioglitazone -mineral oil -modafinil -mycophenolate -nefazodone -oxcarbazepine -phenytoin -prednisolone -ritonavir or other medicines for HIV infection or AIDS -rosuvastatin -selegiline -soy isoflavones supplements -St. John's wort -tamoxifen or raloxifene -theophylline -thyroid hormones -topiramate -warfarin This list may not describe all possible interactions. Give your health care provider a list of all the medicines, herbs, non-prescription drugs, or dietary supplements you use. Also tell them if you smoke,  drink alcohol, or use illegal drugs. Some items may interact with your medicine. What should I watch for while using this medicine?  Visit your doctor or health care professional for regular checks on your progress. You will need a regular breast and pelvic exam and Pap smear while on this medicine. You should also discuss the need for regular mammograms with your health care professional, and follow his or her guidelines for these tests. This medicine can make your body retain fluid, making your fingers, hands, or ankles swell. Your blood pressure can go up. Contact your doctor or health care professional if you feel you are retaining fluid. Use an additional method of contraception during the first cycle that you take these tablets. If you have any reason to think you are pregnant, stop taking this medicine right away and contact your doctor or health care professional. If you are taking this medicine for hormone related problems, it may take several cycles of use to see improvement in your condition. Smoking increases the risk of getting a blood clot or having a stroke while you are taking birth control pills, especially if you are more than 36 years old. You are strongly advised not to smoke. This medicine can make you more sensitive to the sun. Keep out of the sun. If you cannot avoid being in the sun, wear protective clothing and use sunscreen. Do not use sun lamps or tanning beds/booths. If you wear contact lenses and notice visual changes, or if the lenses begin to feel uncomfortable, consult your eye care specialist. In some women, tenderness, swelling, or minor bleeding of the gums may occur. Notify your dentist if this happens. Brushing and flossing your teeth regularly may help limit this. See your dentist regularly and inform your dentist of the medicines you are taking. If you are going to have elective surgery, you may need to stop taking this medicine before the surgery. Consult your health  care professional for advice. This medicine does not protect you against HIV infection (AIDS) or any other sexually transmitted diseases. What side effects may I notice from receiving this medicine? Side effects that you should report to your doctor or health care professional as soon as possible: -breast tissue changes or discharge -changes in vaginal bleeding during your period or between your periods -chest pain -coughing up blood -dizziness or fainting spells -headaches or migraines -leg, arm or groin pain -severe or sudden headaches -stomach pain (severe) -sudden shortness of breath -sudden loss of coordination, especially on one side of the body -speech problems -symptoms of vaginal infection like itching, irritation or unusual discharge -tenderness in the upper abdomen -vomiting -weakness or numbness in the arms or legs, especially on one side of the body -yellowing of the eyes or skin Side effects that usually do not require medical attention (report to your doctor or health care professional if they continue or are bothersome): -breakthrough bleeding and spotting that continues beyond the 3 initial cycles of pills -breast tenderness -mood changes, anxiety, depression, frustration, anger, or emotional outbursts -increased sensitivity to sun or ultraviolet light -nausea -skin rash, acne, or brown spots on the skin -weight gain (slight) This list may not describe all possible side effects. Call your doctor for medical advice about side effects. You may report side effects to FDA at 1-800-FDA-1088. Where should I keep my medicine? Keep out of the reach of children. Store at room temperature between 15 and 30 degrees C (59 and 86 degrees F). Throw away any unused medicine after the expiration date. NOTE: This sheet is a summary. It may not cover all possible information. If you have questions about  this medicine, talk to your doctor, pharmacist, or health care provider.  2015,  Elsevier/Gold Standard. (2008-10-14 13:40:47)

## 2015-07-19 NOTE — Assessment & Plan Note (Signed)
Patient to schedule appt for next week for pap smear.  Currently menstruating and declines pap while on cycle

## 2015-07-19 NOTE — Progress Notes (Signed)
Patient ID: AHNESTY FINFROCK, female   DOB: 08/12/1981, 34 y.o.   MRN: [id number]    Subjective: CC: postpartum follow up HPI: Patient is a 34 y.o. female presenting to clinic today for follow up. Concerns today include:  1. Contraception Patient reports that she no longer desires Depo.  She would like, instead to take OCPs.  No personal or family h/o clotting disorders.  Patient is a NON smoker.  She is not breast feeding.    2. Postpartum care Patient reports that she is doing well.  Baby Nila Nephew is doing well.  Her milk has dried up.  She reports that she bled for about 2.5 weeks after delivery.  She has just now started her menstrual cycle again.  She asks that we defer pap until she is off her cycle.  She reports minimal discomfort at incision site.  No fevers, chills, purulence.  3. Constipation She reports some constipation (stooling only once per week, she normally stools daily) since taking FeSO4.  She is taking this twice daily.  She would like something for constipation.    Social History Reviewed: non smoker. FamHx and MedHx updated.  Please see EMR. Health Maintenance: Flu shot needed  ROS: All other systems reviewed and are negative.  Objective: Office vital signs reviewed. BP 128/81 mmHg  Pulse 73  Temp(Src) 98.3 F (36.8 C) (Oral)  Ht 5\' 7"  (1.702 m)  Wt 236 lb 6.4 oz (107.23 kg)  BMI 37.02 kg/m2  LMP 07/19/2015  Physical Examination:  General: Awake, alert, well nourished, NAD HEENT: Normal, EOMI, MMM Cardio: RRR, S1S2 heard, no murmurs appreciated Pulm: CTAB, no wheezes, rhonchi or rales GI: soft, NT/ND,+BS x4, incision site is clean and well healing.  No evidence of infection or dehiscence.  GU: deffered Extremities: WWP, No edema, cyanosis or clubbing; +2 pulses bilaterally MSK: Normal gait and station Psych: good eye contact, mood stable, speech normal  Assessment/ Plan: 34 y.o. female   Contraception Patient would like to use OCPs instead of depo.   No h/o clots or smoking.  Patient is not breastfeeding so believe combo OCP would be safe.  6wk post partum, Currently menstruating. -Start Sun following cycle -Instructions reviewed with patient, who voices good understanding. -Return in 1 week for Pap smear.  Cervical high risk HPV (human papillomavirus) test positive Patient to schedule appt for next week for pap smear.  Currently menstruating and declines pap while on cycle    Janora Norlander, DO PGY-2, [place]

## 2015-07-19 NOTE — Assessment & Plan Note (Signed)
Patient would like to use OCPs instead of depo.  No h/o clots or smoking.  Patient is not breastfeeding so believe combo OCP would be safe.  6wk post partum, Currently menstruating. -Start Sun following cycle -Instructions reviewed with patient, who voices good understanding. -Return in 1 week for Pap smear.

## 2015-07-26 ENCOUNTER — Encounter: Payer: Self-pay | Admitting: Family Medicine

## 2015-07-26 ENCOUNTER — Ambulatory Visit (INDEPENDENT_AMBULATORY_CARE_PROVIDER_SITE_OTHER): Payer: Medicaid Other | Admitting: Family Medicine

## 2015-07-26 ENCOUNTER — Other Ambulatory Visit (HOSPITAL_COMMUNITY)
Admission: RE | Admit: 2015-07-26 | Discharge: 2015-07-26 | Disposition: A | Discharge status: Home / Self Care | Payer: Medicaid Other | Source: Ambulatory Visit | Attending: Family Medicine | Admitting: Family Medicine

## 2015-07-26 VITALS — BP 133/86 | HR 84 | Temp 97.7°F | Ht 67.0 in | Wt 236.0 lb

## 2015-07-26 DIAGNOSIS — G5603 Carpal tunnel syndrome, bilateral upper limbs: Secondary | ICD-10-CM

## 2015-07-26 DIAGNOSIS — Z1151 Encounter for screening for human papillomavirus (HPV): Secondary | ICD-10-CM | POA: Insufficient documentation

## 2015-07-26 DIAGNOSIS — G5602 Carpal tunnel syndrome, left upper limb: Secondary | ICD-10-CM | POA: Diagnosis not present

## 2015-07-26 DIAGNOSIS — R87810 Cervical high risk human papillomavirus (HPV) DNA test positive: Secondary | ICD-10-CM

## 2015-07-26 DIAGNOSIS — G5601 Carpal tunnel syndrome, right upper limb: Secondary | ICD-10-CM

## 2015-07-26 DIAGNOSIS — Z124 Encounter for screening for malignant neoplasm of cervix: Secondary | ICD-10-CM

## 2015-07-26 DIAGNOSIS — Z01419 Encounter for gynecological examination (general) (routine) without abnormal findings: Secondary | ICD-10-CM | POA: Diagnosis present

## 2015-07-26 MED ORDER — MELOXICAM 7.5 MG PO TABS: 7.5000 mg | ORAL_TABLET | Freq: Every day | ORAL | Status: DC

## 2015-07-26 NOTE — Patient Instructions (Signed)
Use braces at night.  Take medication I have prescribed you daily with food.   Carpal Tunnel Syndrome The carpal tunnel is an area under the skin of the palm of your hand. Nerves, blood vessels, and strong tissues (tendons) pass through the tunnel. The tunnel can become puffy (swollen). If this happens, a nerve can be pinched in the wrist. This causes carpal tunnel syndrome.  HOME CARE  Take all medicine as told by your doctor.  If you were given a splint, wear it as told. Wear it at night or at times when your doctor told you to.  Rest your wrist from the activity that causes your pain.  Put ice on your wrist after long periods of wrist activity.  Put ice in a plastic bag.  Place a towel between your skin and the bag.  Leave the ice on for 15-20 minutes, 03-04 times a day.  Keep all doctor visits as told. GET HELP RIGHT AWAY IF:  You have new problems you cannot explain.  Your problems get worse and medicine does not help. MAKE SURE YOU:   Understand these instructions.  Will watch your condition.  Will get help right away if you are not doing well or get worse. Document Released: 10/18/2011 Document Revised: 01/21/2012 Document Reviewed: 10/18/2011 [facility] Patient Information 2015 [place], [place]. This information is not intended to replace advice given to you by your health care provider. Make sure you discuss any questions you have with your health care provider.

## 2015-07-26 NOTE — Progress Notes (Signed)
    Subjective: CC: pap and wrist pain HPI: Patient is a 34 y.o. female presenting to clinic today for office visit. Concerns today include:  1. Wrist pain Patient reports that she has had b/l wrist pain x2 weeks with numbness and tingling with activity.  Reports some weakness in b/l hands when doing activity for a while (like driving, typing).  Taking motrin 600mg  w/ minimal relief.  Has had Carpal tunnel surgery in both wrists in 01/2014 and 02/2014.  Has appt with surgeon at end of month.  Not using braces.  Feels like wrists are weaker.  2. Cervical cancer screening Discussed past Pap smear with patient.  Patient denies sudden weight loss, fevers, chills, nausea, vomiting, anorexia, abnormal vaginal discharge.  Has just finished cycle.  Patient's last menstrual period was 07/19/2015.  Social History Reviewed: non smoker. FamHx and MedHx updated.  Please see EMR. Health Maintenance: n/a  ROS: All other systems reviewed and are negative.  Objective: Office vital signs reviewed. BP 133/86 mmHg  Pulse 84  Temp(Src) 97.7 F (36.5 C) (Oral)  Ht 5\' 7"  (1.702 m)  Wt 236 lb (107.049 kg)  BMI 36.95 kg/m2  LMP 07/19/2015  Physical Examination:  General: Awake, alert, well nourished, NAD HEENT: Normal, EOMI, MMM GU: normal external vaginal tissue, cervix visualized, appears normal, brown discharge from os, no cervical motion TTP, no abdominal masses Extremities: WWP, No edema, cyanosis or clubbing; +2 pulses bilaterally MSK: Normal gait and station, negative tinel's, mildly positive phalen's, post surgical skin changes appreciated on b/l wrists. No overt weakness appreciated on exam.  Assessment/ Plan: 34 y.o. female with  Carpal tunnel syndrome S/p b/l carpal tunnel surgery in 2015.  Symptoms have returned -Stop Motrin, Start Mobic -Wrist brace at night -Patient has scheduled f/u with surgeon at the end of the month  Cervical high risk HPV (human papillomavirus) test  positive -Repeat pap smear today -Will contact patient with results   Janora Norlander, DO PGY-2, [place]

## 2015-07-26 NOTE — Assessment & Plan Note (Signed)
-  Repeat pap smear today -Will contact patient with results

## 2015-07-26 NOTE — Assessment & Plan Note (Signed)
S/p b/l carpal tunnel surgery in 2015.  Symptoms have returned -Stop Motrin, Start Mobic -Wrist brace at night -Patient has scheduled f/u with surgeon at the end of the month

## 2015-07-28 ENCOUNTER — Encounter: Payer: Self-pay | Admitting: Family Medicine

## 2015-07-29 LAB — CYTOLOGY - PAP

## 2015-08-17 ENCOUNTER — Encounter: Payer: Self-pay | Admitting: Family Medicine

## 2015-08-17 ENCOUNTER — Ambulatory Visit (INDEPENDENT_AMBULATORY_CARE_PROVIDER_SITE_OTHER): Payer: Medicaid Other | Admitting: Family Medicine

## 2015-08-17 VITALS — BP 143/86 | HR 73 | Temp 98.1°F | Ht 67.0 in | Wt 235.1 lb

## 2015-08-17 DIAGNOSIS — M545 Low back pain, unspecified: Secondary | ICD-10-CM

## 2015-08-17 DIAGNOSIS — L089 Local infection of the skin and subcutaneous tissue, unspecified: Secondary | ICD-10-CM | POA: Diagnosis not present

## 2015-08-17 MED ORDER — CEPHALEXIN 500 MG PO CAPS: 500.0000 mg | ORAL_CAPSULE | Freq: Two times a day (BID) | ORAL | Status: DC

## 2015-08-17 MED ORDER — TRAMADOL HCL 50 MG PO TABS: 50.0000 mg | ORAL_TABLET | Freq: Three times a day (TID) | ORAL | Status: DC | PRN

## 2015-08-17 MED ORDER — KETOROLAC TROMETHAMINE 30 MG/ML IJ SOLN
30.0000 mg | Freq: Once | INTRAMUSCULAR | Status: AC
  Administered 2015-08-17: 30 mg via INTRAMUSCULAR

## 2015-08-17 NOTE — Patient Instructions (Signed)
If your symptoms get worse, you develop fevers, chills, nausea or vomiting please seek immediate medical attention.  I suspect the back pain is from where you had your epidural placed.  If you develop weakness, numbness or tingling, are unable to urinate or have fecal incontinence seek immediate medical attention.  Take the antibiotic as prescribed.  Follow up with me as needed.  Back Pain, Adult Back pain is very common in adults.The cause of back pain is rarely dangerous and the pain often gets better over time.The cause of your back pain may not be known. Some common causes of back pain include:  Strain of the muscles or ligaments supporting the spine.  Wear and tear (degeneration) of the spinal disks.  Arthritis.  Direct injury to the back. For many people, back pain may return. Since back pain is rarely dangerous, most people can learn to manage this condition on their own. HOME CARE INSTRUCTIONS Watch your back pain for any changes. The following actions may help to lessen any discomfort you are feeling:  Remain active. It is stressful on your back to sit or stand in one place for long periods of time. Do not sit, drive, or stand in one place for more than 30 minutes at a time. Take short walks on even surfaces as soon as you are able.Try to increase the length of time you walk each day.  Exercise regularly as directed by your health care provider. Exercise helps your back heal faster. It also helps avoid future injury by keeping your muscles strong and flexible.  Do not stay in bed.Resting more than 1-2 days can delay your recovery.  Pay attention to your body when you bend and lift. The most comfortable positions are those that put less stress on your recovering back. Always use proper lifting techniques, including:  Bending your knees.  Keeping the load close to your body.  Avoiding twisting.  Find a comfortable position to sleep. Use a firm mattress and lie on your side  with your knees slightly bent. If you lie on your back, put a pillow under your knees.  Avoid feeling anxious or stressed.Stress increases muscle tension and can worsen back pain.It is important to recognize when you are anxious or stressed and learn ways to manage it, such as with exercise.  Take medicines only as directed by your health care provider. Over-the-counter medicines to reduce pain and inflammation are often the most helpful.Your health care provider may prescribe muscle relaxant drugs.These medicines help dull your pain so you can more quickly return to your normal activities and healthy exercise.  Apply ice to the injured area:  Put ice in a plastic bag.  Place a towel between your skin and the bag.  Leave the ice on for 20 minutes, 2-3 times a day for the first 2-3 days. After that, ice and heat may be alternated to reduce pain and spasms.  Maintain a healthy weight. Excess weight puts extra stress on your back and makes it difficult to maintain good posture. SEEK MEDICAL CARE IF:  You have pain that is not relieved with rest or medicine.  You have increasing pain going down into the legs or buttocks.  You have pain that does not improve in one week.  You have night pain.  You lose weight.  You have a fever or chills. SEEK IMMEDIATE MEDICAL CARE IF:   You develop new bowel or bladder control problems.  You have unusual weakness or numbness in your arms  or legs.  You develop nausea or vomiting.  You develop abdominal pain.  You feel faint.   This information is not intended to replace advice given to you by your health care provider. Make sure you discuss any questions you have with your health care provider.   Document Released: 10/29/2005 Document Revised: 11/19/2014 Document Reviewed: 03/02/2014 Elsevier Interactive Patient Education [employer].

## 2015-08-17 NOTE — Progress Notes (Signed)
    Subjective: CC: low back pain, ?drainage from csection site HPI: Patient is a 34 y.o. female presenting to clinic today for office visit. Concerns today include:  1. Low back pain Patient reports low back pain at epidural site.  Started about 1 week ago.  Has used Tylenol and Motrin with no relief.  Pain is nonradiating.  No weakness, numbness or tingling.  No urinary retention, fecal incontinence, or falls.  Pain is worse with movement and bending or standing for long periods of time.  Pain is constant.  Currently at a 9/10.  2. Incision site drainage Patient reports that drainage has been present since surgery.  Drainage has increased over last 2 weeks on lateral and medial aspects of incision site.  No fevers, chills, nausea or vomiting.  No pain at incision site.  Social History Reviewed: non smoker. FamHx and MedHx updated.  Please see EMR. Health Maintenance: Flu shot due  ROS: All other systems reviewed and are negative.  Objective: Office vital signs reviewed. BP 143/86 mmHg  Pulse 73  Temp(Src) 98.1 F (36.7 C) (Oral)  Ht 5\' 7"  (1.702 m)  Wt 235 lb 1.6 oz (106.641 kg)  BMI 36.81 kg/m2  LMP 07/19/2015  Physical Examination:  General: Awake, alert, well nourished, NAD HEENT: Normal, MMM Cardio: normal heart rate noted Pulm: normal WOB GI: soft, NT/ND,+BS x4 Skin: well healing c-section scar with small area of dehiscence midline with no exudate or TTP or surrounding erythema and a 0.25 area of mild fluctuance at the Left lateral edge of incision with scant purulent discharge.  Again no surrounding TTP or erythema or induration  MSK: normal gait and station, no midline or paraspinal TTP, no bony abnormalities, pain appears to be about the L4-5 area.  Assessment/ Plan: 34 y.o. female with  1. Back pain at L4-L5 level.  Somewhat late to be related to epidural but cannot rule this out.  No apparent injury.  NO red flags on exam. - traMADol (ULTRAM) 50 MG tablet; Take  1 tablet (50 mg total) by mouth every 8 (eight) hours as needed.  Dispense: 30 tablet; Refill: 0 - ketorolac (TORADOL) 30 MG/ML injection 30 mg; Inject 1 mL (30 mg total) into the muscle once. - Return precautions reviewed.  2. Soft tissue infection.  ?small abscess.  No systemic symptoms. - cephALEXin (KEFLEX) 500 MG capsule; Take 1 capsule (500 mg total) by mouth 2 (two) times daily.  Dispense: 20 capsule; Refill: 0 - Return precautions reviewed.  - follow up after completion of abx or sooner if needed  Janora Norlander, DO PGY-2, [place]

## 2015-08-23 ENCOUNTER — Telehealth: Payer: Self-pay | Admitting: Family Medicine

## 2015-08-23 NOTE — Telephone Encounter (Signed)
Will forward to Dr. Lajuana Ripple who saw patient for this recent issue. Mckaylie Vasey,CMA

## 2015-08-23 NOTE — Telephone Encounter (Signed)
Patient requesting a stronger pain medicatio.  Tramadol not helping with pain.   Only make patient sleepy.  Can call niece to inform when medication has been sent and she will inform patient.  Phone number left is niece's number

## 2015-08-23 NOTE — Telephone Encounter (Signed)
Spoke with niece and patient wants to make an appt.  Made for 08-29-2015.  Brennon Otterness,CMA

## 2015-08-23 NOTE — Telephone Encounter (Signed)
If patient's pain is worsening, she should be evaluated. Please advise her to schedule an appointment. She may need imaging.

## 2015-08-29 ENCOUNTER — Ambulatory Visit: Payer: Medicaid Other | Admitting: Internal Medicine

## 2015-08-30 ENCOUNTER — Ambulatory Visit (INDEPENDENT_AMBULATORY_CARE_PROVIDER_SITE_OTHER): Payer: Medicaid Other | Admitting: Internal Medicine

## 2015-08-30 ENCOUNTER — Encounter: Payer: Self-pay | Admitting: Internal Medicine

## 2015-08-30 VITALS — BP 146/84 | HR 76 | Temp 98.2°F | Ht 67.0 in | Wt 232.0 lb

## 2015-08-30 DIAGNOSIS — G5603 Carpal tunnel syndrome, bilateral upper limbs: Secondary | ICD-10-CM

## 2015-08-30 DIAGNOSIS — M545 Low back pain, unspecified: Secondary | ICD-10-CM

## 2015-08-30 MED ORDER — LUMBAR BACK BRACE/SUPPORT PAD MISC: 1.0000 [IU] | Freq: Every day | Status: DC

## 2015-08-30 MED ORDER — MELOXICAM 7.5 MG PO TABS: 15.0000 mg | ORAL_TABLET | Freq: Every day | ORAL | Status: DC

## 2015-08-30 NOTE — Progress Notes (Signed)
Patient ID: Diane Harvey, female   DOB: Aug 20, 1981, 34 y.o.   MRN: [id number] Subjective:   CC: f/u for low back pain   Low Back Pain: - midline low back pain at epidural site (done in July for delivery) - initially started mid September but has increased intensity since patient returned to work after birth of her child - characterizes as sharp pain that starts in her low lumber sacral area that radiates up her spine; this pain occurs intermittently when she bends at her hip after standing for long period of time or with other types of activity. Does not always occur with these movements. This occurs 3-4 times a day  - also notes of another pain that she is unable to characterize that is intermittent and located at the level of L2-4 which does not radiate; there is no pattern to this pain  - pain alleviated with laying on her side.  - no LE weakness, numbness, tingling; no urinary retention; no urinary or fecal incontinence; no fevers/chills  - cannot recall inciting event/injury - denies HA, nausea, dizziness  - was seen in clinic on Oct 5th; was given Toradol injection and prescribed Tramadol. Toradol helped decrease pain for 3-4 days. Was not able to take Tramadol because this made her drowsy.  - has been using Ibuprofen 600mg  q 4 hrs and tylenol 100mg  q 4 if ibuprofen did not help (additionally taking Meloxicam 7.5mg  daily for carpel tunnel)    Review of Systems - Per HPI.  PMH, FH, or SH reviewed Smoking status: never smoker    Objective:  Physical Exam BP 146/84 mmHg  Pulse 76  Temp(Src) 98.2 F (36.8 C) (Oral)  Ht 5\' 7"  (1.702 m)  Wt 232 lb (105.235 kg)  BMI 36.33 kg/m2  LMP 08/23/2015  Breastfeeding? No GEN: NAD CV: RRR, no murmurs, rubs, or gallops PULM: CTAB, normal effort MSK:  Spine: mild discomfort with palpation of spine at level L2-4; no paraspinal tenderness; normal ROM of back but does note of pain that she is unable to characterize at L2-4 level with flexion,  extension, and lateral flexion of back (no radiation of pain); normal gait;  LE strength 5/5; normal sensation of LE. Normal patella reflexes EXTR: No lower extremity edema or calf tenderness PSYCH: Mood and affect euthymic, normal rate and volume of speech NEURO: Awake, alert, no focal deficits grossly, normal speech      Assessment:     Diane Harvey is a 34 y.o. female with h/o here for back pain     Plan:     # See problem list and after visit summary for problem-specific plans.   Follow-up: Follow up PRN if symptoms do not improve   Smiley Houseman, MD [place]

## 2015-08-30 NOTE — Patient Instructions (Signed)
Thank you for coming - Please take Meloxicam 15mg  daily - i provided a prescription for a lower back support brace. Please wear this during work  - ask your work about ADA form to see if you can do work activities that will allow you to sit instead of be on your feet the entire time - please return to clinic if your symptoms do not improve - please seek immediate medical care if you have leg weakness, numbness, or tingling; if you cannot urinate or are not able to hold your urine or stool

## 2015-08-30 NOTE — Assessment & Plan Note (Signed)
Mild discomfort with palpation of spine at level L2-4; no paraspinal tenderness; normal ROM of back but does note of pain that she is unable to characterize at L2-4 level with flexion, extension, and lateral flexion of back (no radiation of pain); normal gait. No inciting event. Possibly related to epidural although symptoms began ~1.5 months after epidural. Symptoms worsened after starting work. Unable to tolerate Tramadol. No red flags. No symptoms of CSF leak.  - Meloxicam 15g daily - advised patient to avoid taking other NSAIDs while taking Meloxicam  - prescribed lumbar support brace for use during work - told patient about ADA form that she can obtain from work  - return precautions given

## 2015-08-31 ENCOUNTER — Telehealth: Payer: Self-pay | Admitting: Family Medicine

## 2015-08-31 ENCOUNTER — Other Ambulatory Visit: Payer: Self-pay | Admitting: Internal Medicine

## 2015-08-31 NOTE — Telephone Encounter (Signed)
Patient was seen by Dr. Dallas Schimke on 08/30/15 for back pain. Patient states she stayed out of work today due to the continued pain in her back and is requesting a work note for today. Please advise.

## 2015-08-31 NOTE — Telephone Encounter (Signed)
Printed letter and placed at front desk. Called patient to let her know letter is ready to pick up

## 2015-08-31 NOTE — Progress Notes (Signed)
Completed letter for return to work.

## 2015-08-31 NOTE — Telephone Encounter (Signed)
Will forward to MD to make sure note is ok and will call patient to pick up when ready. Vivia Rosenburg,CMA

## 2015-11-18 ENCOUNTER — Ambulatory Visit (INDEPENDENT_AMBULATORY_CARE_PROVIDER_SITE_OTHER): Payer: Medicaid Other | Admitting: Family Medicine

## 2015-11-18 ENCOUNTER — Encounter: Payer: Self-pay | Admitting: Family Medicine

## 2015-11-18 ENCOUNTER — Telehealth: Payer: Self-pay | Admitting: Family Medicine

## 2015-11-18 VITALS — BP 138/87 | HR 87 | Temp 97.6°F | Wt 227.0 lb

## 2015-11-18 DIAGNOSIS — M25561 Pain in right knee: Secondary | ICD-10-CM | POA: Diagnosis not present

## 2015-11-18 DIAGNOSIS — G8929 Other chronic pain: Secondary | ICD-10-CM | POA: Insufficient documentation

## 2015-11-18 DIAGNOSIS — M25569 Pain in unspecified knee: Secondary | ICD-10-CM

## 2015-11-18 MED ORDER — NAPROXEN 500 MG PO TABS: 500.0000 mg | ORAL_TABLET | Freq: Two times a day (BID) | ORAL | Status: DC

## 2015-11-18 NOTE — Telephone Encounter (Signed)
Will forward to MD. Tashauna Caisse,CMA  

## 2015-11-18 NOTE — Telephone Encounter (Signed)
I spoke with patient already. Instructed to d/c Meloxicam and start Naproxyn.

## 2015-11-18 NOTE — Progress Notes (Signed)
Subjective:     Patient ID: Diane Harvey, female   DOB: 11-Jan-1981, 35 y.o.   MRN: [id number]  Knee Pain  Incident onset: C/O right knee pain, this has been on going for more than 1 yr. Recently about 1 month ago she started having worsening of her symptoms. Incident location: No injury. There was no injury mechanism. The pain is present in the right knee. The quality of the pain is described as shooting. The pain is at a severity of 8/10. The pain is moderate. The pain has been intermittent since onset. Associated symptoms include an inability to bear weight and a loss of motion. Pertinent negatives include no loss of sensation, muscle weakness, numbness or tingling. Associated symptoms comments: No recent fall. Sometimes her right knee will swell up. The symptoms are aggravated by movement and weight bearing. She has tried elevation for the symptoms. The treatment provided moderate relief.    Current Outpatient Prescriptions on File Prior to Visit  Medication Sig Dispense Refill  . meloxicam (MOBIC) 7.5 MG tablet Take 2 tablets (15 mg total) by mouth daily. 30 tablet 1  . Norgestimate-Ethinyl Estradiol Triphasic (ORTHO TRI-CYCLEN, 28,) 0.18/0.215/0.25 MG-35 MCG tablet Take 1 tablet by mouth daily. 1 Package 11   No current facility-administered medications on file prior to visit.   Past Medical History  Diagnosis Date  . Knee pain 07/31/2011    See orthopedist for this.  Unknown name.  Has tried injections with good results.  Plans on returning.   . Supervision of other normal pregnancy 04/07/2015     Clinic/provider [facility] Cordelia [facility]) Prenatal Labs  Dating LMP and 21w u/s Blood type: O/POS/-- (02/16 1350)   Genetic Screen declined Antibody:NEG (02/16 1350)  Anatomic [place] HC<3rd %ile at 25w Rubella: 2.16 (02/16 1350)  GTT Early: 125               Third trimester: 3 hour normal RPR: NON REAC (05/26 0909)   Flu vaccine Off season HBsAg: NEGATIVE (02/16 1350)   TDaP vaccine 04/07/15                                               Rhogam: not indicated HIV: NONREACTIVE (05/26 0909)   GBS      Negative                                (For PCN allergy, check sensitivities) GBS:NOT DETECTED (07/01 1440)  Contraception BTL - consent signed 04/07/15; Changed mind and wants Depo Pap: 09/26/12 - negative w/ +HR HPV, needs repeat pap postpartum  Baby Food Bottle   Circumcision N/a   Pediatrician [place] Person FOB           Review of Systems  Respiratory: Negative.   Cardiovascular: Negative.   Genitourinary: Negative.   Musculoskeletal: Positive for joint swelling and arthralgias. Negative for back pain and gait problem.  Neurological: Negative for tingling and numbness.  All other systems reviewed and are negative.  Filed Vitals:   11/18/15 1026  BP: 138/87  Pulse: 87  Temp: 97.6 F (36.4 C)  TempSrc: Oral  Weight: 227 lb (102.967 kg)       Objective:   Physical Exam  Constitutional: She appears well-developed. No distress.  Cardiovascular: Normal rate,  regular rhythm, normal heart sounds and intact distal pulses.   No murmur heard. Pulmonary/Chest: Effort normal and breath sounds normal. No respiratory distress. She has no wheezes.  Musculoskeletal: Normal range of motion. She exhibits no edema.       Right knee: She exhibits normal range of motion, no swelling and no effusion. Tenderness found.       Legs: Nursing note and vitals reviewed.      Assessment:     Right knee pain     Plan:     Check problem list

## 2015-11-18 NOTE — Assessment & Plan Note (Addendum)
Likely arthritis. She was seen at [facility] orthopedic in the past. Plan today is to obtain a knee xray. I referred her to PT and started her or Naproxen. D/C Meloxicam. I called her pharmacy and cancel refills on Meloxicam. She will benefit from following up with her orthopedic. I will refer pending xray report.  I called to inform patient that her Mobic/Meloxicam has been discontinued and she verbalized understanding.

## 2015-11-18 NOTE — Telephone Encounter (Signed)
Pt is returning call she just received. No notes as of yet. Please contact pt. Diane Harvey, ASA

## 2015-11-18 NOTE — Patient Instructions (Signed)
Knee Pain  Knee pain is a common problem. It can have many causes. The pain often goes away by following your doctor's home care instructions. Treatment for ongoing pain will depend on the cause of your pain. If your knee pain continues, more tests may be needed to diagnose your condition. Tests may include X-rays or other imaging studies of your knee.  HOME CARE   Take medicines only as told by your doctor.   Rest your knee and keep it raised (elevated) while you are resting.   Do not do things that cause pain or make your pain worse.   Avoid activities where both feet leave the ground at the same time, such as running, jumping rope, or doing jumping jacks.   Apply ice to the knee area:    Put ice in a plastic bag.    Place a towel between your skin and the bag.    Leave the ice on for 20 minutes, 2-3 times a day.   Ask your doctor if you should wear an elastic knee support.   Sleep with a pillow under your knee.   Lose weight if you are overweight. Being overweight can make your knee hurt more.   Do not use any tobacco products, including cigarettes, chewing tobacco, or electronic cigarettes. If you need help quitting, ask your doctor. Smoking may slow the healing of any bone and joint problems that you may have.  GET HELP IF:   Your knee pain does not stop, it changes, or it gets worse.   You have a fever along with knee pain.   Your knee gives out or locks up.   Your knee becomes more swollen.  GET HELP RIGHT AWAY IF:    Your knee feels hot to the touch.   You have chest pain or trouble breathing.     This information is not intended to replace advice given to you by your health care provider. Make sure you discuss any questions you have with your health care provider.     Document Released: 01/25/2009 Document Revised: 11/19/2014 Document Reviewed: 12/30/2013  Elsevier Interactive Patient Education 2016 [employer]vier [employer]

## 2016-01-03 ENCOUNTER — Encounter: Payer: Self-pay | Admitting: Family Medicine

## 2016-01-03 ENCOUNTER — Ambulatory Visit (INDEPENDENT_AMBULATORY_CARE_PROVIDER_SITE_OTHER): Payer: Medicaid Other | Admitting: Family Medicine

## 2016-01-03 VITALS — BP 129/83 | HR 88 | Temp 98.5°F | Wt 231.0 lb

## 2016-01-03 DIAGNOSIS — J029 Acute pharyngitis, unspecified: Secondary | ICD-10-CM

## 2016-01-03 DIAGNOSIS — M25531 Pain in right wrist: Secondary | ICD-10-CM | POA: Diagnosis not present

## 2016-01-03 LAB — POCT RAPID STREP A (OFFICE): Rapid Strep A Screen: NEGATIVE

## 2016-01-03 MED ORDER — OXYCODONE HCL 5 MG PO CAPS: 5.0000 mg | ORAL_CAPSULE | ORAL | Status: DC | PRN

## 2016-01-03 NOTE — Assessment & Plan Note (Signed)
Rapid strep negative. Likely viral pharyngitis. Patient reassured. A/B treatment not required. May use Ibuprofen or tylenol as needed for pain.  F/U as needed.

## 2016-01-03 NOTE — Patient Instructions (Signed)

## 2016-01-03 NOTE — Assessment & Plan Note (Signed)
May be related to her carpel tunnel syndrome ( acute on chronic) vs overuse due to her nature of work. Since she is acutely in pain with no improvement from use of naproxyn, I will give her a short course of oxycodone. If no improvement we will obtain an xray. Return precaution discussed. F/U as needed.

## 2016-01-03 NOTE — Progress Notes (Signed)
Subjective:     Patient ID: Diane Harvey, female   DOB: August 06, 1981, 35 y.o.   MRN: [id number]  Sore Throat  This is a new problem. Episode onset: 3 days ago. The problem has been unchanged. Neither side of throat is experiencing more pain than the other. There has been no fever. The pain is at a severity of 5/10. The pain is moderate. Associated symptoms include headaches and trouble swallowing. Pertinent negatives include no coughing, diarrhea, hoarse voice, neck pain, shortness of breath or vomiting. She has had no exposure to strep. Exposure to: All three of her kids had the flu tested positive. She has tried NSAIDs for the symptoms.  Wrist Pain  The pain is present in the right wrist. This is a recurrent problem. Episode onset: Worsening in the last 1 wk. The problem has been gradually worsening. The quality of the pain is described as aching. The pain is at a severity of 9/10. The pain is moderate. Associated symptoms include tingling. Pertinent negatives include no fever, itching, joint swelling, numbness or stiffness. Treatments tried: Naproxen. The treatment provided moderate relief.   Current Outpatient Prescriptions on File Prior to Visit  Medication Sig Dispense Refill  . naproxen (NAPROSYN) 500 MG tablet Take 1 tablet (500 mg total) by mouth 2 (two) times daily with a meal. 60 tablet 0  . Norgestimate-Ethinyl Estradiol Triphasic (ORTHO TRI-CYCLEN, 28,) 0.18/0.215/0.25 MG-35 MCG tablet Take 1 tablet by mouth daily. 1 Package 11   No current facility-administered medications on file prior to visit.   Past Medical History  Diagnosis Date  . Knee pain 07/31/2011    See orthopedist for this.  Unknown name.  Has tried injections with good results.  Plans on returning.   . Supervision of other normal pregnancy 04/07/2015     Clinic/provider [facility]) Prenatal Labs  Dating LMP and 21w u/s Blood type: O/POS/-- (02/16 1350)   Genetic Screen declined Antibody:NEG (02/16  1350)  Anatomic [place] HC<3rd %ile at 25w Rubella: 2.16 (02/16 1350)  GTT Early: 125               Third trimester: 3 hour normal RPR: NON REAC (05/26 0909)   Flu vaccine Off season HBsAg: NEGATIVE (02/16 1350)   TDaP vaccine 04/07/15                                              Rhogam: not indicated HIV: NONREACTIVE (05/26 0909)   GBS      Negative                                (For PCN allergy, check sensitivities) GBS:NOT DETECTED (07/01 1440)  Contraception BTL - consent signed 04/07/15; Changed mind and wants Depo Pap: 09/26/12 - negative w/ +HR HPV, needs repeat pap postpartum  Baby Food Bottle   Circumcision N/a   Pediatrician [facility]          Review of Systems  Constitutional: Negative for fever.  HENT: Positive for trouble swallowing. Negative for hoarse voice.   Respiratory: Negative.  Negative for cough and shortness of breath.   Cardiovascular: Negative.   Gastrointestinal: Negative for vomiting and diarrhea.  Musculoskeletal: Negative for joint swelling, stiffness and neck pain.  Wrist pain  Skin: Negative for itching.  Neurological: Positive for tingling and headaches. Negative for numbness.  All other systems reviewed and are negative.  Filed Vitals:   01/03/16 1017  BP: 129/83  Pulse: 88  Temp: 98.5 F (36.9 C)  TempSrc: Oral  Weight: 231 lb (104.781 kg)       Objective:   Physical Exam  Constitutional: She appears well-developed. No distress.  HENT:  Head: Normocephalic.  Right Ear: Tympanic membrane and external ear normal.  Left Ear: Tympanic membrane and external ear normal.  Mouth/Throat: Uvula is midline, oropharynx is clear and moist and mucous membranes are normal.  Eyes: Conjunctivae and EOM are normal. Pupils are equal, round, and reactive to light. Right eye exhibits no discharge. Left eye exhibits no discharge.  Neck: Neck supple.  Cardiovascular: Normal rate, regular rhythm and normal heart sounds.   No murmur  heard. Pulmonary/Chest: Effort normal and breath sounds normal. No respiratory distress. She has no wheezes.  Musculoskeletal:       Right wrist: She exhibits tenderness. She exhibits normal range of motion, no swelling, no crepitus and no deformity.       Left wrist: Normal.  Nursing note and vitals reviewed.      Assessment:      Pharyngitis Wrist pain     Plan:     Check problem list.

## 2016-01-05 ENCOUNTER — Telehealth: Payer: Self-pay | Admitting: Family Medicine

## 2016-01-05 NOTE — Telephone Encounter (Signed)
Please give her letter to return next Monday.

## 2016-01-05 NOTE — Telephone Encounter (Signed)
Pt called because she tried going to work today but she is still to weak. She would like another letter with an extension date and when she might be able to return to work. jw

## 2016-01-05 NOTE — Telephone Encounter (Signed)
Letter printed and placed up front.  Unable to LM due to full mailbox.  Please inform her of this when she calls back.  Thanks [employer]

## 2016-01-05 NOTE — Telephone Encounter (Signed)
Will forward to MD to advise. Jazmin Hartsell,CMA  

## 2016-02-14 ENCOUNTER — Encounter: Payer: Self-pay | Admitting: Family Medicine

## 2016-02-14 ENCOUNTER — Ambulatory Visit (INDEPENDENT_AMBULATORY_CARE_PROVIDER_SITE_OTHER): Payer: Medicaid Other | Admitting: Family Medicine

## 2016-02-14 VITALS — BP 130/86 | HR 80 | Temp 97.7°F | Wt 230.0 lb

## 2016-02-14 DIAGNOSIS — G5603 Carpal tunnel syndrome, bilateral upper limbs: Secondary | ICD-10-CM

## 2016-02-14 MED ORDER — GABAPENTIN 300 MG PO CAPS: 300.0000 mg | ORAL_CAPSULE | Freq: Three times a day (TID) | ORAL | Status: DC

## 2016-02-14 NOTE — Patient Instructions (Signed)
Carpal Tunnel Syndrome Carpal tunnel syndrome is a condition that causes pain in your hand and arm. The carpal tunnel is a narrow area that is on the palm side of your wrist. Repeated wrist motion or certain diseases may cause swelling in the tunnel. This swelling can pinch the main nerve in the wrist (median nerve).  HOME CARE If You Have a Splint:  Wear it as told by your doctor. Remove it only as told by your doctor.  Loosen the splint if your fingers:  Become numb and tingle.  Turn blue and cold.  Keep the splint clean and dry. General Instructions  Take over-the-counter and prescription medicines only as told by your doctor.  Rest your wrist from any activity that may be causing your pain. If needed, talk to your employer about changes that can be made in your work, such as getting a wrist pad to use while typing.  If directed, apply ice to the painful area:  Put ice in a plastic bag.  Place a towel between your skin and the bag.  Leave the ice on for 20 minutes, 2-3 times per day.  Keep all follow-up visits as told by your doctor. This is important.  Do any exercises as told by your doctor, physical therapist, or occupational therapist. GET HELP IF:  You have new symptoms.  Medicine does not help your pain.  Your symptoms get worse.   This information is not intended to replace advice given to you by your health care provider. Make sure you discuss any questions you have with your health care provider.   Document Released: 10/18/2011 Document Revised: 07/20/2015 Document Reviewed: 03/16/2015 Elsevier Interactive Patient Education 2016 Elsevier Inc.  

## 2016-02-14 NOTE — Assessment & Plan Note (Signed)
Various treatment options given. I recommended wearing wrist brace to sleep as well.  I recommended referral back to ortho for steroid injection consideration, although I mentioned at times risk outweighs benefit of this procedure. Gabapentin prescribed for pain. May continue Naproxen as needed.

## 2016-02-14 NOTE — Progress Notes (Signed)
Subjective:     Patient ID: Diane Harvey, female   DOB: 1981-05-30, 35 y.o.   MRN: [id number]  HPI CTS: B/L wrist pain for years. Worse on the right wrist. Pain is about 10/10 in severity, shooting pain with occasional numbness, worse with repetitive movement especially at work. Denies any recent injury. Pain is radiates from her wrist to her thumb and 2nd digits b/L. She wear wrist brace at work almost everyday with minimal improvement. She had B/L CTS surgery 2 yrs ago with [facility], she felt better for a while and pain is now back. Naproxen helps some. She will like time off from work to help her feel better.  Current Outpatient Prescriptions on File Prior to Visit  Medication Sig Dispense Refill  . naproxen (NAPROSYN) 500 MG tablet Take 1 tablet (500 mg total) by mouth 2 (two) times daily with a meal. 60 tablet 0  . Norgestimate-Ethinyl Estradiol Triphasic (ORTHO TRI-CYCLEN, 28,) 0.18/0.215/0.25 MG-35 MCG tablet Take 1 tablet by mouth daily. 1 Package 11   No current facility-administered medications on file prior to visit.   Past Medical History  Diagnosis Date  . Knee pain 07/31/2011    See orthopedist for this.  Unknown name.  Has tried injections with good results.  Plans on returning.   . Supervision of other normal pregnancy 04/07/2015     Clinic/provider [facility]) Prenatal Labs  Dating LMP and 21w u/s Blood type: O/POS/-- (02/16 1350)   Genetic Screen declined Antibody:NEG (02/16 1350)  Anatomic [place] HC<3rd %ile at 25w Rubella: 2.16 (02/16 1350)  GTT Early: 125               Third trimester: 3 hour normal RPR: NON REAC (05/26 0909)   Flu vaccine Off season HBsAg: NEGATIVE (02/16 1350)   TDaP vaccine 04/07/15                                              Rhogam: not indicated HIV: NONREACTIVE (05/26 0909)   GBS      Negative                                (For PCN allergy, check sensitivities) GBS:NOT DETECTED (07/01 1440)  Contraception BTL - consent signed  04/07/15; Changed mind and wants Depo Pap: 09/26/12 - negative w/ +HR HPV, needs repeat pap postpartum  Baby Food Bottle   Circumcision N/a   Pediatrician [facility]   Support Person FOB        Filed Vitals:   02/14/16 1046  BP: 130/86  Pulse: 80  Temp: 97.7 F (36.5 C)  TempSrc: Oral  Weight: 230 lb (104.327 kg)     Review of Systems  Respiratory: Negative.   Cardiovascular: Negative.   Musculoskeletal:       B/L wrist pain  All other systems reviewed and are negative.      Objective:   Physical Exam  Constitutional: She is oriented to person, place, and time. She appears well-developed. No distress.  Cardiovascular: Normal rate, regular rhythm, normal heart sounds and intact distal pulses.   No murmur heard. Pulmonary/Chest: Effort normal and breath sounds normal. No respiratory distress. She has no wheezes.  Abdominal: Soft. She exhibits no distension. There is no tenderness.  Musculoskeletal: Normal range of  motion. She exhibits no edema.  Neurological: She is alert and oriented to person, place, and time. She has normal strength and normal reflexes. No cranial nerve deficit or sensory deficit.  No sensory loss over her hands B/L No muscle atrophy. ++ Tinel and Phalen test worse on right.  Nursing note and vitals reviewed.      Assessment:     Carpal Tunnel Sydrome     Plan:     Various treatment options given. I recommended wearing wrist brace to sleep as well.  I recommended referral back to ortho for steroid injection consideration, although I mentioned at times risk outweighs benefit of this procedure. Gabapentin prescribed for pain. May continue Naproxen as needed.

## 2016-02-16 ENCOUNTER — Ambulatory Visit: Payer: Medicaid Other | Admitting: Internal Medicine

## 2016-02-16 ENCOUNTER — Ambulatory Visit: Payer: Medicaid Other | Admitting: Family Medicine

## 2016-06-20 ENCOUNTER — Emergency Department (HOSPITAL_COMMUNITY): Payer: Medicaid Other

## 2016-06-20 ENCOUNTER — Emergency Department (HOSPITAL_COMMUNITY)
Admission: EM | Admit: 2016-06-20 | Discharge: 2016-06-20 | Disposition: A | Discharge status: Home / Self Care | Payer: Medicaid Other | Attending: Emergency Medicine | Admitting: Emergency Medicine

## 2016-06-20 ENCOUNTER — Encounter (HOSPITAL_COMMUNITY): Payer: Self-pay | Admitting: *Deleted

## 2016-06-20 DIAGNOSIS — S0990XA Unspecified injury of head, initial encounter: Secondary | ICD-10-CM | POA: Diagnosis present

## 2016-06-20 DIAGNOSIS — Z79899 Other long term (current) drug therapy: Secondary | ICD-10-CM | POA: Insufficient documentation

## 2016-06-20 DIAGNOSIS — Y939 Activity, unspecified: Secondary | ICD-10-CM | POA: Insufficient documentation

## 2016-06-20 DIAGNOSIS — S0083XA Contusion of other part of head, initial encounter: Secondary | ICD-10-CM | POA: Insufficient documentation

## 2016-06-20 DIAGNOSIS — S8002XA Contusion of left knee, initial encounter: Secondary | ICD-10-CM | POA: Diagnosis not present

## 2016-06-20 DIAGNOSIS — S50812A Abrasion of left forearm, initial encounter: Secondary | ICD-10-CM | POA: Diagnosis not present

## 2016-06-20 DIAGNOSIS — Y9289 Other specified places as the place of occurrence of the external cause: Secondary | ICD-10-CM | POA: Diagnosis not present

## 2016-06-20 DIAGNOSIS — Y999 Unspecified external cause status: Secondary | ICD-10-CM | POA: Diagnosis not present

## 2016-06-20 MED ORDER — ACETAMINOPHEN 500 MG PO TABS
1000.0000 mg | ORAL_TABLET | Freq: Once | ORAL | Status: AC
  Administered 2016-06-20: 1000 mg via ORAL

## 2016-06-20 MED ORDER — ACETAMINOPHEN 500 MG PO TABS
ORAL_TABLET | ORAL | Status: AC
  Filled 2016-06-20: qty 2

## 2016-06-20 MED ORDER — ONDANSETRON 4 MG PO TBDP: 4.0000 mg | ORAL_TABLET | Freq: Three times a day (TID) | ORAL | 0 refills | Status: DC | PRN

## 2016-06-20 MED ORDER — METHOCARBAMOL 500 MG PO TABS: 500.0000 mg | ORAL_TABLET | Freq: Four times a day (QID) | ORAL | 0 refills | Status: DC

## 2016-06-20 MED ORDER — ONDANSETRON 4 MG PO TBDP
4.0000 mg | ORAL_TABLET | Freq: Once | ORAL | Status: AC
  Administered 2016-06-20: 4 mg via ORAL
  Filled 2016-06-20: qty 1

## 2016-06-20 NOTE — ED Notes (Addendum)
E signature pad not working so unable to have pt sign
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
## 2016-06-20 NOTE — ED Notes (Signed)
Transported to CT 
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
## 2016-07-02 ENCOUNTER — Other Ambulatory Visit: Payer: Self-pay | Admitting: Family Medicine

## 2016-07-02 DIAGNOSIS — Z3009 Encounter for other general counseling and advice on contraception: Secondary | ICD-10-CM

## 2016-07-02 MED ORDER — NORGESTIM-ETH ESTRAD TRIPHASIC 0.18/0.215/0.25 MG-35 MCG PO TABS: 1.0000 | ORAL_TABLET | Freq: Every day | ORAL | 5 refills | Status: DC

## 2016-07-02 NOTE — Telephone Encounter (Signed)
Needs refill on birth control.  Has appt Sept 15.  [facility] on [address]

## 2016-07-27 ENCOUNTER — Encounter: Payer: Medicaid Other | Admitting: Family Medicine

## 2016-08-17 ENCOUNTER — Encounter: Payer: Self-pay | Admitting: Family Medicine

## 2016-08-17 ENCOUNTER — Ambulatory Visit (INDEPENDENT_AMBULATORY_CARE_PROVIDER_SITE_OTHER): Payer: Medicaid Other | Admitting: Family Medicine

## 2016-08-17 ENCOUNTER — Other Ambulatory Visit (HOSPITAL_COMMUNITY)
Admission: RE | Admit: 2016-08-17 | Discharge: 2016-08-17 | Disposition: A | Discharge status: Home / Self Care | Payer: Medicaid Other | Source: Ambulatory Visit | Attending: Family Medicine | Admitting: Family Medicine

## 2016-08-17 VITALS — BP 140/88 | HR 79 | Ht 67.0 in | Wt 225.0 lb

## 2016-08-17 DIAGNOSIS — R87810 Cervical high risk human papillomavirus (HPV) DNA test positive: Secondary | ICD-10-CM

## 2016-08-17 DIAGNOSIS — Z1151 Encounter for screening for human papillomavirus (HPV): Secondary | ICD-10-CM | POA: Insufficient documentation

## 2016-08-17 DIAGNOSIS — Z0000 Encounter for general adult medical examination without abnormal findings: Secondary | ICD-10-CM | POA: Diagnosis not present

## 2016-08-17 DIAGNOSIS — Z01419 Encounter for gynecological examination (general) (routine) without abnormal findings: Secondary | ICD-10-CM | POA: Insufficient documentation

## 2016-08-17 NOTE — Progress Notes (Signed)
Subjective:     Diane Harvey is a 35 y.o. female and is here for a comprehensive physical exam. The patient reports no problems.  Social History   Social History  . Marital status: Married    Spouse name: N/A  . Number of children: N/A  . Years of education: N/A   Occupational History  . Not on file.   Social History Main Topics  . Smoking status: Never Smoker  . Smokeless tobacco: Never Used  . Alcohol use No  . Drug use: No  . Sexual activity: Yes   Other Topics Concern  . Not on file   Social History Narrative   Lives alone with three children.  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   Health Maintenance  Topic Date Due  . INFLUENZA VACCINE  02/09/2017 (Originally 06/12/2016)  . PAP SMEAR  07/25/2018  . TETANUS/TDAP  04/06/2025  . HIV Screening  Completed    The following portions of the patient's history were reviewed and updated as appropriate: allergies, current medications, past family history, past medical history, past social history, past surgical history and problem list.  Review of Systems Pertinent items are noted in HPI.   Objective:    BP 140/88   Pulse 79   Ht 5\' 7"  (1.702 m)   Wt 225 lb (102.1 kg)   LMP 08/03/2016 (Approximate)   BMI 35.24 kg/m  General appearance: alert, cooperative and appears stated age Head: Normocephalic, without obvious abnormality, atraumatic Eyes: conjunctivae/corneas clear. PERRL, EOM's intact. Fundi benign. Ears: normal TM's and external ear canals both ears Throat: lips, mucosa, and tongue normal; teeth and gums normal Neck: no adenopathy, no carotid bruit, no JVD, supple, symmetrical, trachea midline and thyroid not enlarged, symmetric, no tenderness/mass/nodules Lungs: clear to auscultation bilaterally Heart: regular rate and rhythm, S1, S2 normal, no murmur, click, rub or gallop Abdomen: soft, non-tender; bowel sounds normal; no masses,  no organomegaly Pelvic: cervix normal in  appearance, external genitalia normal, no adnexal masses or tenderness, no cervical motion tenderness, rectovaginal septum normal, uterus normal size, shape, and consistency and vagina normal without discharge Extremities: extremities normal, atraumatic, no cyanosis or edema Skin: Skin color, texture, turgor normal. No rashes or lesions Neurologic: Alert and oriented X 3, normal strength and tone. Normal symmetric reflexes. Normal coordination and gait Mental status: Alert, oriented, thought content appropriate    Assessment:    Healthy female exam. Gyn/PAP completed today     Plan:     Normal wellness exam. Flu shot recommended but she declined. PAP repeated given previously abnormal test. If abnormal again, I will refer her to colpo. All questions were answered. Age appropriate counseling given. F/U as needed.

## 2016-08-17 NOTE — Patient Instructions (Signed)
Pap Test WHY AM I HAVING THIS TEST? A pap test is sometimes called a pap smear. It is a screening test that is used to check for signs of cancer of the vagina, cervix, and uterus. The test can also identify the presence of infection or precancerous changes. Your health care provider will likely recommend you have this test done on a regular basis. This test may be done:  Every 3 years, starting at age 37.  Every 5 years, in combination with testing for the presence of human papillomavirus (HPV).  More or less often depending on other medical conditions.  WHAT KIND OF SAMPLE IS TAKEN? Using a small cotton swab, plastic spatula, or brush, your health care provider will collect a sample of cells from the surface of your cervix. Your cervix is the opening to your uterus, also called a womb. Secretions from the cervix and vagina may also be collected. HOW DO I PREPARE FOR THE TEST?  Be aware of where you are in your menstrual cycle. You may be asked to reschedule the test if you are menstruating on the day of the test.  You may need to reschedule if you have a known vaginal infection on the day of the test.  You may be asked to avoid douching or taking a bath the day before or the day of the test.  Some medicines can cause abnormal test results, such as digitalis and tetracycline. Talk with your health care provider before your test if you take one of these medicines. WHAT DO THE RESULTS MEAN? Abnormal test results may indicate a number of health conditions. These may include:  Cancer. Although pap test results cannot be used to diagnose cancer of the cervix, vagina, or uterus, they may suggest the possibility of cancer. Further tests would be required to determine if cancer is present.  Sexually transmitted disease.  Fungal infection.  Parasite infection.  Herpes infection.  A condition causing or contributing to infertility. It is your responsibility to obtain your test results. Ask  the lab or department performing the test when and how you will get your results. Contact your health care provider to discuss any questions you have about your results.   This information is not intended to replace advice given to you by your health care provider. Make sure you discuss any questions you have with your health care provider.   Document Released: 01/19/2003 Document Revised: 11/19/2014 Document Reviewed: 03/22/2014 Elsevier Interactive Patient Education 2016 Elsevier Inc.  

## 2016-08-21 ENCOUNTER — Telehealth: Payer: Self-pay | Admitting: Family Medicine

## 2016-08-21 LAB — CYTOLOGY - PAP

## 2016-08-21 NOTE — Telephone Encounter (Signed)
Regarding normal PAP.

## 2016-11-15 ENCOUNTER — Encounter: Payer: Self-pay | Admitting: Family Medicine

## 2016-11-15 ENCOUNTER — Ambulatory Visit (INDEPENDENT_AMBULATORY_CARE_PROVIDER_SITE_OTHER): Payer: Medicaid Other | Admitting: Family Medicine

## 2016-11-15 VITALS — BP 132/90 | HR 78 | Temp 98.4°F | Ht 67.0 in | Wt 224.2 lb

## 2016-11-15 DIAGNOSIS — S6991XA Unspecified injury of right wrist, hand and finger(s), initial encounter: Secondary | ICD-10-CM | POA: Diagnosis not present

## 2016-11-15 NOTE — Patient Instructions (Signed)
It was great seeing you today! We have addressed the following issues today  1. I will send you to an orthopedic surgeon that specializes in hand for them to further assess the extend of your injury and offer treatment options.   If we did any lab work today, and the results require attention, either me or my nurse will get in touch with you. If everything is normal, you will get a letter in mail. If you don't hear from [place] in two weeks, please give [place] a call. Otherwise, we look forward to seeing you again at your next visit. If you have any questions or concerns before then, please call the clinic at [phone].   Please bring all your medications to every doctors visit   Sign up for My Chart to have easy access to your labs results, and communication with your Primary care physician.     Please check-out at the front desk before leaving the clinic.    Take Care,

## 2016-11-15 NOTE — Progress Notes (Signed)
Date of Visit: 11/15/2016   HPI:  Patient presents for a same day appointment to discuss right index finger pain while attempting to flex finger.  Right index pain: Patient reports that she injured her finger at work while working on the [employer], she hit her finger on a metal rod protecting them from the conveyor belt. Her finger was straight when she forcefully hit the rod at a perpendicular angle. Patient denies pain after accident with no decrease in functionaliy for the first two days. On Tuesday(1/2) patient start to notice some swelling in her finger and had difficulty bending her finger. By Wednesday (1/3), patient was unable to bend her finger due to the pain, and swelling continue. She took tylenol with no relief in her pain. Pain is localized to her index finger as she is able move other fingers. Patient with a history of carpal tunnel. Patient denies any fever, chills, redness or drainage from finger. Pain appears to be localized to the right index proximal joint.  ROS: See HPI  [place]:  Past Medical History:  Diagnosis Date  . Knee pain 07/31/2011   See orthopedist for this.  Unknown name.  Has tried injections with good results.  Plans on returning.   . Supervision of other normal pregnancy 04/07/2015    Clinic/provider [facility] Cordelia Poche) Prenatal Labs  Dating LMP and 21w u/s Blood type: O/POS/-- (02/16 1350)   Genetic Screen declined Antibody:NEG (02/16 1350)  Anatomic [place] HC<3rd %ile at 25w Rubella: 2.16 (02/16 1350)  GTT Early: 125               Third trimester: 3 hour normal RPR: NON REAC (05/26 0909)   Flu vaccine Off season HBsAg: NEGATIVE (02/16 1350)   TDaP vaccine 04/07/15                                              Rhogam: not indicated HIV: NONREACTIVE (05/26 0909)   GBS      Negative                                (For PCN allergy, check sensitivities) GBS:NOT DETECTED (07/01 1440)  Contraception BTL - consent signed 04/07/15; Changed mind and wants Depo  Pap: 09/26/12 - negative w/ +HR HPV, needs repeat pap postpartum  Baby Food Bottle   Circumcision N/a   Pediatrician [facility]   Support Person FOB          PHYSICAL EXAM: BP 132/90 (BP Location: Right Arm, Patient Position: Sitting, Cuff Size: Normal)   Pulse 78   Temp 98.4 F (36.9 C) (Oral)   Ht 5\' 7"  (1.702 m)   Wt 224 lb 3.2 oz (101.7 kg)   LMP 11/09/2016 (Exact Date)   SpO2 98%   Breastfeeding? No   BMI 35.11 kg/m   General: NAD, pleasant, able to participate in exam Cardiac: RRR, normal heart sounds, no murmurs. 2+ radial and PT pulses bilaterally Respiratory: CTAB, normal effort, No wheezes, rales or rhonchi Abdomen: soft, nontender, nondistended, no hepatic or splenomegaly, +BS Right Hand Exam   Range of Motion   Hand  PIP Index: abnormal   Comments:  Swelling between PIP and MP, no erythema. Very minimal range of motion in index finger, intact for rest of the hand. Sensation preserved  in all fingers including index finger.   Left Hand Exam  Left hand exam is normal.     Skin: warm and dry, no rashes noted Neuro: alert and oriented x4, no focal deficits Psych: Normal affect and mood ASSESSMENT/PLAN:  #PIP joint swelling and decrease range of motion of right index finger 2/2 trauma Injury seemed to localized to the dorsal as pect of PIP joint of the right index finger. Given description of the injury, tendon involvement most likely extensor (similar to basket ball finger injury) given pain experienced during passive attempt at flexion. Could not definitely rule out fracture without imaging, subluxation/dislocation could also be consider. Patient will need to be further examine by a hand specialist for further workup and management. --Will keep finger in a splint at the PIP joint at around 15 degree with close follow up arrange --Will refer to hand specialist for imaging and additional workup as needed    Marjie Skiff, MD [place]

## 2016-12-03 ENCOUNTER — Encounter: Payer: Self-pay | Admitting: Family Medicine

## 2016-12-03 ENCOUNTER — Ambulatory Visit (INDEPENDENT_AMBULATORY_CARE_PROVIDER_SITE_OTHER): Payer: Medicaid Other | Admitting: Family Medicine

## 2016-12-03 VITALS — BP 122/90 | HR 86 | Temp 98.6°F | Ht 67.0 in | Wt 224.8 lb

## 2016-12-03 DIAGNOSIS — M545 Low back pain, unspecified: Secondary | ICD-10-CM

## 2016-12-03 MED ORDER — TRAMADOL HCL 50 MG PO TABS: 50.0000 mg | ORAL_TABLET | Freq: Three times a day (TID) | ORAL | 0 refills | Status: DC | PRN

## 2016-12-03 MED ORDER — IBUPROFEN 600 MG PO TABS
600.0000 mg | ORAL_TABLET | Freq: Three times a day (TID) | ORAL | 0 refills | Status: DC | PRN
Start: 2016-12-03 — End: 2017-12-17

## 2016-12-03 MED ORDER — KETOROLAC TROMETHAMINE 30 MG/ML IJ SOLN
30.0000 mg | Freq: Once | INTRAMUSCULAR | Status: AC
  Administered 2016-12-03: 30 mg via INTRAMUSCULAR

## 2016-12-03 NOTE — Patient Instructions (Signed)
I recommend that you perform the home exercises that I have provided.  This will help reduce the reoccurrence of your back pain.

## 2016-12-03 NOTE — Progress Notes (Signed)
   Subjective: CC: low back pain [id number] Diane Harvey is a 36 y.o. female presenting to clinic today for same day appointment. PCP: Andrena Mews, MD Concerns today include:  1. Low back pain Patient reports a 1 day history of right sided low back pain.  She points to her lumbo sacral area as the point of pain.  She describes pain as achy.  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  She reports that pain is worse with flexion.  Denies numbness/ tingling, weakness, falls, injury, urinary retention, fecal incontinence.  She reports that this has happened before last year.  She attributes this to having an epidural during her last pregnancy.  She reports that she took Tylenol and used icy/hot patch this time with minimal relief.  No Known Allergies  Social Hx reviewed. MedHx, current medications and allergies reviewed.  Please see EMR. ROS: Per HPI  Objective: Office vital signs reviewed. BP 122/90 (BP Location: Left Arm, Patient Position: Sitting, Cuff Size: Normal)   Pulse 86   Temp 98.6 F (37 Diane) (Oral)   Ht 5\' 7"  (1.702 m)   Wt 224 lb 12.8 oz (102 kg)   LMP 11/27/2016 (Approximate)   SpO2 99%   Breastfeeding? No   BMI 35.21 kg/m   Physical Examination:  General: Awake, alert, overight, No acute distress MSK: normal gait and normal station, has Full AROM but has increased pain with flexion and rotation to the right.  No midline TTP to spine. + TTP to paraspinal region in the L5-S1 region. Neuro: 5/5 LE Strength and light touch sensation grossly intact, 2/4 patellar DTRs  Assessment/ Plan: 36 y.o. female   1. Acute right-sided low back pain without sciatica.  No red flags.  Likely a pulled muscle.  Recommended NSAID use starting tomorrow.  Toradol given today.  HEP provided, Sports Med Advisor. - Motrin 600mg  Q6 prn pain - Tramadol 50mg  q8 prn severe breakthrough pain.  [place] narcotic database reviewed.  No red  flags. - ketorolac (TORADOL) 30 MG/ML injection 30 mg; Inject 1 mL (30 mg total) into the muscle once. - Return precautions reviewed - If persistent, would consider PT referral. - Follow up with PCP prn  Diane Norlander, DO PGY-3, [facility]

## 2016-12-04 ENCOUNTER — Encounter: Payer: Self-pay | Admitting: Family Medicine

## 2016-12-04 ENCOUNTER — Telehealth: Payer: Self-pay | Admitting: Family Medicine

## 2016-12-04 NOTE — Telephone Encounter (Signed)
Done

## 2016-12-04 NOTE — Telephone Encounter (Signed)
Printed letter and put up front for pick up. Called pt. Mailbox full, could not leave a message. Ottis Stain, CMA

## 2016-12-04 NOTE — Telephone Encounter (Signed)
Pt called because she was seen here yesterday and given a not to be out of work . Her employer needs this letter to be specific. It needs to say the date she can return to work on it also. Pt would like to pick this up after lunch today. Please call and let her know so she can pick up and turn in. jw

## 2016-12-04 NOTE — Telephone Encounter (Signed)
Will forward to Dr. Lajuana Ripple who saw patient for this visit. Annlouise Gerety,CMA

## 2016-12-17 ENCOUNTER — Other Ambulatory Visit: Payer: Self-pay | Admitting: Family Medicine

## 2016-12-17 DIAGNOSIS — Z3009 Encounter for other general counseling and advice on contraception: Secondary | ICD-10-CM

## 2016-12-17 NOTE — Telephone Encounter (Signed)
Pt called and needs a refill on her BC called in. jw

## 2016-12-18 MED ORDER — NORGESTIM-ETH ESTRAD TRIPHASIC 0.18/0.215/0.25 MG-35 MCG PO TABS: 1.0000 | ORAL_TABLET | Freq: Every day | ORAL | 5 refills | Status: DC

## 2016-12-25 ENCOUNTER — Ambulatory Visit (INDEPENDENT_AMBULATORY_CARE_PROVIDER_SITE_OTHER): Payer: Medicaid Other | Admitting: *Deleted

## 2016-12-25 DIAGNOSIS — Z111 Encounter for screening for respiratory tuberculosis: Secondary | ICD-10-CM

## 2016-12-25 NOTE — Progress Notes (Signed)
   PPD placed Left Forearm.  Pt to return 12/27/2016 for reading.  Pt tolerated intradermal injection. Derl Barrow, RN

## 2016-12-27 ENCOUNTER — Ambulatory Visit (INDEPENDENT_AMBULATORY_CARE_PROVIDER_SITE_OTHER): Payer: Medicaid Other | Admitting: *Deleted

## 2016-12-27 ENCOUNTER — Encounter: Payer: Self-pay | Admitting: *Deleted

## 2016-12-27 DIAGNOSIS — Z111 Encounter for screening for respiratory tuberculosis: Secondary | ICD-10-CM

## 2016-12-27 LAB — TB SKIN TEST
INDURATION: 0 mm
TB Skin Test: NEGATIVE

## 2016-12-27 NOTE — Progress Notes (Signed)
   PPD Reading Note PPD read and results entered in EpicCare. Result: 0 mm induration. Interpretation: Negative If test not read within 48-72 hours of initial placement, patient advised to repeat in other arm 1-3 weeks after this test. Allergic reaction: no  Nolberto Cheuvront L, RN  

## 2017-01-04 ENCOUNTER — Ambulatory Visit: Payer: Medicaid Other | Admitting: Family Medicine

## 2017-06-20 ENCOUNTER — Other Ambulatory Visit: Payer: Self-pay | Admitting: Family Medicine

## 2017-06-20 DIAGNOSIS — Z3009 Encounter for other general counseling and advice on contraception: Secondary | ICD-10-CM

## 2017-06-20 NOTE — Telephone Encounter (Signed)
Pt calling to request refill of:  Name of Medication(s):  Ortho tri-cyclean  Last date of OV:  12-03-16 Pharmacy:  [facility]   Will route refill request to Clinic RN.  Discussed with patient policy to call pharmacy for future refills.  Also, discussed refills may take up to 48 hours to approve or deny.  Renella Cunas

## 2017-06-21 MED ORDER — NORGESTIM-ETH ESTRAD TRIPHASIC 0.18/0.215/0.25 MG-35 MCG PO TABS: 1.0000 | ORAL_TABLET | Freq: Every day | ORAL | 11 refills | Status: DC

## 2017-09-26 ENCOUNTER — Encounter: Payer: Self-pay | Admitting: Family Medicine

## 2017-09-27 ENCOUNTER — Ambulatory Visit (INDEPENDENT_AMBULATORY_CARE_PROVIDER_SITE_OTHER): Payer: Medicaid Other | Admitting: Family Medicine

## 2017-09-27 ENCOUNTER — Other Ambulatory Visit: Payer: Self-pay

## 2017-09-27 ENCOUNTER — Encounter: Payer: Self-pay | Admitting: Family Medicine

## 2017-09-27 VITALS — BP 112/70 | HR 61 | Temp 98.7°F | Ht 67.0 in | Wt 221.0 lb

## 2017-09-27 DIAGNOSIS — G8929 Other chronic pain: Secondary | ICD-10-CM | POA: Diagnosis not present

## 2017-09-27 DIAGNOSIS — Z0000 Encounter for general adult medical examination without abnormal findings: Secondary | ICD-10-CM | POA: Diagnosis present

## 2017-09-27 DIAGNOSIS — M5441 Lumbago with sciatica, right side: Secondary | ICD-10-CM

## 2017-09-27 DIAGNOSIS — D649 Anemia, unspecified: Secondary | ICD-10-CM

## 2017-09-27 LAB — POCT HEMOGLOBIN: HEMOGLOBIN: 12.9 g/dL (ref 12.2–16.2)

## 2017-09-27 MED ORDER — CYCLOBENZAPRINE HCL 5 MG PO TABS: 5.0000 mg | ORAL_TABLET | Freq: Two times a day (BID) | ORAL | 1 refills | Status: DC | PRN

## 2017-09-27 MED ORDER — KETOROLAC TROMETHAMINE 30 MG/ML IJ SOLN
30.0000 mg | Freq: Once | INTRAMUSCULAR | Status: AC
  Administered 2017-09-27: 30 mg via INTRAMUSCULAR

## 2017-09-27 MED ORDER — NAPROXEN 500 MG PO TABS: 500.0000 mg | ORAL_TABLET | Freq: Two times a day (BID) | ORAL | 1 refills | Status: DC | PRN

## 2017-09-27 NOTE — Patient Instructions (Signed)

## 2017-09-27 NOTE — Progress Notes (Signed)
Subjective:     Diane Harvey is a 36 y.o. female and is here for a comprehensive physical exam. The patient reports problems - back pain for more than 1 yr, recently worsened in the last 2 weeks.. She is currently having back pain. She took off work for few days due to pain. Denies recent fall or injury. Also to check anemia  Social History   Socioeconomic History  . Marital status: Married    Spouse name: Not on file  . Number of children: Not on file  . Years of education: Not on file  . Highest education level: Not on file  Social Needs  . Financial resource strain: Not on file  . Food insecurity - worry: Not on file  . Food insecurity - inability: Not on file  . Transportation needs - medical: Not on file  . Transportation needs - non-medical: Not on file  Occupational History  . Not on file  Tobacco Use  . Smoking status: Never Smoker  . Smokeless tobacco: Never Used  Substance and Sexual Activity  . Alcohol use: No  . Drug use: No  . Sexual activity: Yes  Other Topics Concern  . Not on file  Social History Narrative   Lives alone with three children.  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   Health Maintenance  Topic Date Due  . INFLUENZA VACCINE  02/20/2018 (Originally 06/12/2017)  . PAP SMEAR  08/18/2019  . TETANUS/TDAP  04/06/2025  . HIV Screening  Completed    The following portions of the patient's history were reviewed and updated as appropriate: allergies, current medications, past family history, past medical history, past social history, past surgical history and problem list.  Review of Systems Pertinent items noted in HPI and remainder of comprehensive ROS otherwise negative.   Objective:    BP 112/70   Pulse 61   Temp 98.7 F (37.1 C) (Oral)   Ht 5\' 7"  (1.702 m)   Wt 221 lb (100.2 kg)   LMP 09/20/2017 (Approximate)   SpO2 97%   BMI 34.61 kg/m  General appearance: alert, cooperative and appears stated age Head:  Normocephalic, without obvious abnormality, atraumatic Eyes: conjunctivae/corneas clear. PERRL, EOM's intact. Fundi benign. Ears: normal TM's and external ear canals both ears Throat: lips, mucosa, and tongue normal; teeth and gums normal Neck: no adenopathy, no carotid bruit, no JVD, supple, symmetrical, trachea midline and thyroid not enlarged, symmetric, no tenderness/mass/nodules Back: symmetric, no curvature. ROM normal. No CVA tenderness. Lungs: clear to auscultation bilaterally Heart: regular rate and rhythm, S1, S2 normal, no murmur, click, rub or gallop Abdomen: soft, non-tender; bowel sounds normal; no masses,  no organomegaly Pelvic: deferred Extremities: extremities normal, atraumatic, no cyanosis or edema Lymph nodes: Cervical, supraclavicular, and axillary nodes normal. Neurologic: Alert and oriented X 3, normal strength and tone. Normal symmetric reflexes. Normal coordination and gait    Assessment:    Healthy female exam.  Back pain    Anemia   Plan:     Flu shot offered, but she declined. She is up to date with her PAP test. I reviewed and discussed PAP result. Transition zone was absent. Since HPV is neg, we will recheck in 3 yrs.  She asked about mammogram, recommendation discussed with her. Mom diagnosed with breast cancer at age 44 and passed away few months ago. Technically she should start mammogram at age 76. She agreed with plan.  Back Pain: Xray ordered to evaluate for  arthritis. Naproxen prn pain since Ibuprofen does not help a whole lot. Toradol shot was given today, she stated she got it in the past and it helped. Flexeril prn muscle spasm. Will consider PT referral pending xray report.  Anemia: During pregnancy. POC Hb checked. Will contact her with result.

## 2017-10-07 ENCOUNTER — Telehealth: Payer: Self-pay | Admitting: *Deleted

## 2017-10-07 NOTE — Telephone Encounter (Signed)
Pt lm on nurse line.  She does not feel that meds given at her appt are helping with her back pain.  She would like something else called in if possible. Dezmon Conover, Salome Spotted, CMA

## 2017-10-07 NOTE — Telephone Encounter (Signed)
Thanks [name],  She can use Acetaminophen in addition to the Naproxen. I need to get the result of her lumbar spine xray to know exactly the cause of her back pain before thinking of any stronger meds. I ordered xray during last visit but she is yet to go get it. Please advise her to go get the xray.  Diane Harvey

## 2017-10-08 NOTE — Telephone Encounter (Signed)
LM for patient asking her to call back.  Will inform of message from Dr. Gwendlyn Deutscher when she does. Jazmin Hartsell,CMA

## 2017-10-08 NOTE — Telephone Encounter (Signed)
Patient informed and plans to get this done later this week. Jazmin Hartsell,CMA

## 2017-10-09 ENCOUNTER — Ambulatory Visit (HOSPITAL_COMMUNITY)
Admission: RE | Admit: 2017-10-09 | Discharge: 2017-10-09 | Disposition: A | Discharge status: Home / Self Care | Payer: Medicaid Other | Source: Ambulatory Visit | Attending: Family Medicine | Admitting: Family Medicine

## 2017-10-09 DIAGNOSIS — M5441 Lumbago with sciatica, right side: Secondary | ICD-10-CM | POA: Diagnosis present

## 2017-10-09 DIAGNOSIS — G8929 Other chronic pain: Secondary | ICD-10-CM | POA: Insufficient documentation

## 2017-10-10 ENCOUNTER — Telehealth: Payer: Self-pay | Admitting: Family Medicine

## 2017-10-10 NOTE — Telephone Encounter (Signed)
HIPPA compliant message left for her to call back.   Note: Whenever she calls back, inform her that her back xray is normal and negative for any pathology that could cause back pain.  Continue Naproxen as prescribed for pain. May add Acetaminophen to Naproxen for better pain relieve. She will benefit from PT referral, if she is interested, I will refer, otherwise she can do home back exercise for now.  If pain persists, she will need a CT or an MRI of her back to see if there is any occult pathology.  At this time, I will not recommend a stronger pain medicine. F/U soon for reassessment.

## 2017-10-14 ENCOUNTER — Telehealth: Payer: Self-pay | Admitting: Family Medicine

## 2017-10-14 NOTE — Telephone Encounter (Signed)
Pt called wanting to know her results from her x ray last Wednesday. Would like Dr to call her back regarding these. Please advise

## 2017-10-14 NOTE — Telephone Encounter (Signed)
LM for patient to call back.  Please refer to telephone note from 10-10-17 from Dr. Gwendlyn Deutscher. Jazmin Hartsell,CMA

## 2017-11-25 NOTE — Telephone Encounter (Signed)
Open encounter clean up.Mayfield Schoene R, CMA   

## 2017-11-29 ENCOUNTER — Ambulatory Visit: Payer: Medicaid Other | Admitting: Family Medicine

## 2017-12-03 ENCOUNTER — Ambulatory Visit: Payer: Medicaid Other | Admitting: Family Medicine

## 2017-12-10 ENCOUNTER — Encounter: Payer: Self-pay | Admitting: Family Medicine

## 2017-12-10 ENCOUNTER — Ambulatory Visit: Payer: Medicaid Other | Admitting: Family Medicine

## 2017-12-10 ENCOUNTER — Other Ambulatory Visit: Payer: Self-pay

## 2017-12-10 ENCOUNTER — Telehealth: Payer: Self-pay

## 2017-12-10 VITALS — BP 124/84 | HR 70 | Temp 98.2°F | Wt 221.0 lb

## 2017-12-10 DIAGNOSIS — G8929 Other chronic pain: Secondary | ICD-10-CM | POA: Diagnosis not present

## 2017-12-10 DIAGNOSIS — Z01818 Encounter for other preprocedural examination: Secondary | ICD-10-CM | POA: Diagnosis not present

## 2017-12-10 DIAGNOSIS — M5442 Lumbago with sciatica, left side: Secondary | ICD-10-CM

## 2017-12-10 DIAGNOSIS — Z3009 Encounter for other general counseling and advice on contraception: Secondary | ICD-10-CM

## 2017-12-10 MED ORDER — CYCLOBENZAPRINE HCL 5 MG PO TABS: 5.0000 mg | ORAL_TABLET | Freq: Two times a day (BID) | ORAL | 1 refills | Status: DC | PRN

## 2017-12-10 NOTE — Assessment & Plan Note (Signed)
Lumbar spine xray done in Nov was neg. PT recommended. I will obtain MRI to further evaluate. In the meantime continue Naproxen as prescribed. May use Ibuprofen during the day as needed. I refilled her Flexeril. F/U once MRI result is available.

## 2017-12-10 NOTE — Telephone Encounter (Signed)
MRI scheduled for 2/4 @5pm  at [facility]. Pt has been informed.

## 2017-12-10 NOTE — Progress Notes (Signed)
Subjective:     Patient ID: Diane Harvey, female   DOB: Sep 19, 1981, 37 y.o.   MRN: [id number]  HPI Tubal ligation: Here to discuss birth control. She is currently on OCP and will like to get TBL done. Back pain: Here to f/u for her low back pain on going for more than 1 yr. Pain is worsening. Radiates to the left LL like a shooting pain. She denies recent fall, does not do heavy lifting at work. She uses Ibuprofen, Flexeril and Naproxen as needed. Naproxen mostly at night since it makes her sleep and Ibuprofen during the day.  Current Outpatient Medications on File Prior to Visit  Medication Sig Dispense Refill  . cyclobenzaprine (FLEXERIL) 5 MG tablet Take 1 tablet (5 mg total) 2 (two) times daily as needed by mouth for muscle spasms. 30 tablet 1  . naproxen (NAPROSYN) 500 MG tablet Take 1 tablet (500 mg total) 2 (two) times daily as needed by mouth for moderate pain or headache. 60 tablet 1  . ibuprofen (ADVIL,MOTRIN) 600 MG tablet Take 1 tablet (600 mg total) by mouth every 8 (eight) hours as needed. (Patient not taking: Reported on 12/10/2017) 30 tablet 0  . Norgestimate-Ethinyl Estradiol Triphasic (ORTHO TRI-CYCLEN, 28,) 0.18/0.215/0.25 MG-35 MCG tablet Take 1 tablet by mouth daily. 1 Package 11   No current facility-administered medications on file prior to visit.    Past Medical History:  Diagnosis Date  . Bilateral knee pain 06/22/2014  . Contraception 07/31/2011   Orthotricyclen 07/19/15   . Knee pain 07/31/2011   See orthopedist for this.  Unknown name.  Has tried injections with good results.  Plans on returning.   . Supervision of other normal pregnancy 04/07/2015    Clinic/provider [facility] Cord[facility] P[facility]) Prenatal Labs  Dating LMP and 21w u/s Blood type: O/POS/-- (02/16 1350)   Genetic Screen declined Antibody:NEG (02/16 1350)  Anatomic [place] HC<3rd %ile at 25w Rubella: 2.16 (02/16 1350)  GTT Early: 125               Third trimester: 3 hour normal RPR: NON REAC (05/26 0909)    Flu vaccine Off season HBsAg: NEGATIVE (02/16 1350)   TDaP vaccine 04/07/15                                              Rhogam: not indicated HIV: NONREACTIVE (05/26 0909)   GBS      Negative                                (For PCN allergy, check sensitivities) GBS:NOT DETECTED (07/01 1440)  Contraception BTL - consent signed 04/07/15; Changed mind and wants Depo Pap: 09/26/12 - negative w/ +HR HPV, needs repeat pap postpartum  Baby Food Bottle   Circumcision N/a   Pediatrician [facility]   Support Person FOB        Vitals:   12/10/17 0825  BP: 124/84  Pulse: 70  Temp: 98.2 F (36.8 C)  TempSrc: Oral  SpO2: 99%  Weight: 221 lb (100.2 kg)     Review of Systems  Respiratory: Negative.   Cardiovascular: Negative.   Gastrointestinal: Negative.   Genitourinary: Negative.   Musculoskeletal: Positive for back pain.  All other systems reviewed and are negative.      Objective:  Physical Exam  Constitutional: She is oriented to person, place, and time. She appears well-developed. No distress.  Cardiovascular: Normal rate, regular rhythm and normal heart sounds.  No murmur heard. Pulmonary/Chest: Effort normal and breath sounds normal. No respiratory distress. She has no wheezes.  Abdominal: Soft. Bowel sounds are normal. She exhibits no distension and no mass. There is no tenderness.  Musculoskeletal:       Lumbar back: She exhibits tenderness. She exhibits normal range of motion and no deformity.  Neurological: She is alert and oriented to person, place, and time. No cranial nerve deficit.  Nursing note and vitals reviewed.      Assessment:     Contraceptive management Low back pain    Plan:     Check problem list.  For tubal ligation, I referred her to Gyn. Consent signed today and will be scanned to file.

## 2017-12-10 NOTE — Telephone Encounter (Signed)
-----   Message from Kinnie Feil, MD sent at 12/10/2017  2:10 PM EST ----- Please help patient schedule MRI of her back. Clinic was busy this morning with only one CMA hence we were unable to schedule for her during visit.

## 2017-12-10 NOTE — Patient Instructions (Signed)
What You Need to Know About Female Sterilization Female sterilization is surgery to prevent pregnancy. In this surgery, the fallopian tubes are either blocked or closed off. This prevents eggs from reaching the uterus so that the eggs cannot be fertilized by sperm and you cannot get pregnant. Sterilization is permanent. It should only be done if you are sure that you do not want to be able to have children. What are the sterilization surgery options? There are several kinds of female sterilization surgeries. They include:  Laparoscopic tubal ligation. In this surgery, the fallopian tubes are tied off, sealed with heat, or blocked with a clip, ring, or clamp. A small portion of each fallopian tube may also be removed. This surgery is done through several small cuts (incisions).  Postpartum tubal ligation. This is also called a mini-laparotomy. This surgery is done right after childbirth or 1 or 2 days after childbirth. In this surgery, the fallopian tubes are tied off, sealed with heat, or blocked with a clip, ring, or clamp. A small portion of each fallopian tube may also be removed. The surgery is done through a single incision.  Hysteroscopic sterilization. In this surgery, a tiny, spring-like coil is inserted through the cervix and uterus into the fallopian tubes. The coil causes scarring, which blocks the tubes. After the surgery, contraception should be used for 3 months to allow the scar tissue to form completely.  Is sterilization safe? Generally, sterilization is safe. Complications are rare. However, there are risks. They include:  Bleeding.  Infection.  Reaction to medicine used during the procedure.  Injury to surrounding organs.  Failure of the procedure.  How effective is sterilization? Sterilization is nearly 100% effective, but it can fail. Also, the fallopian tubes can grow back together over time. If this happens, you will be able to get pregnant again. Women who have had  this procedure have a higher chance of having an ectopic pregnancy. An ectopic pregnancy is a pregnancy that happens outside of the uterus. This kind of pregnancy is unsuccessful and can lead to serious bleeding if it is not treated. What are the benefits?  It is usually effective for a lifetime.  It is usually safe.  It does not have the drawbacks of other types of birth control: That means: ? Your hormones are not affected. Because of this, your menstrual periods, sexual desire, and sexual performance will not be affected. ? There are no side effects. What are the drawbacks?  If you change your mind and decide that you want to have children, you may not be able to. Sterilization may be reversed, but a reversal is not always successful.  It does not provide protection against STDs (sexually transmitted diseases).  It increases the chance of having an ectopic pregnancy. This information is not intended to replace advice given to you by your health care provider. Make sure you discuss any questions you have with your health care provider. Document Released: 04/16/2008 Document Revised: 06/21/2016 Document Reviewed: 07/26/2015 Elsevier Interactive Patient Education  2018 Elsevier Inc.  

## 2017-12-16 ENCOUNTER — Ambulatory Visit (HOSPITAL_COMMUNITY)
Admission: RE | Admit: 2017-12-16 | Discharge: 2017-12-16 | Disposition: A | Discharge status: Home / Self Care | Payer: Medicaid Other | Source: Ambulatory Visit | Attending: Family Medicine | Admitting: Family Medicine

## 2017-12-16 DIAGNOSIS — M47817 Spondylosis without myelopathy or radiculopathy, lumbosacral region: Secondary | ICD-10-CM | POA: Insufficient documentation

## 2017-12-16 DIAGNOSIS — M5442 Lumbago with sciatica, left side: Secondary | ICD-10-CM | POA: Insufficient documentation

## 2017-12-16 DIAGNOSIS — G8929 Other chronic pain: Secondary | ICD-10-CM

## 2017-12-16 DIAGNOSIS — M47816 Spondylosis without myelopathy or radiculopathy, lumbar region: Secondary | ICD-10-CM | POA: Insufficient documentation

## 2017-12-16 DIAGNOSIS — M5137 Other intervertebral disc degeneration, lumbosacral region: Secondary | ICD-10-CM | POA: Insufficient documentation

## 2017-12-16 MED ORDER — GADOBENATE DIMEGLUMINE 529 MG/ML IV SOLN
20.0000 mL | Freq: Once | INTRAVENOUS | Status: AC
  Administered 2017-12-16: 20 mL via INTRAVENOUS

## 2017-12-17 ENCOUNTER — Telehealth: Payer: Self-pay | Admitting: Family Medicine

## 2017-12-17 ENCOUNTER — Encounter: Payer: Self-pay | Admitting: Family Medicine

## 2017-12-17 MED ORDER — DICLOFENAC POTASSIUM 50 MG PO TABS: 50.0000 mg | ORAL_TABLET | Freq: Three times a day (TID) | ORAL | 1 refills | Status: DC | PRN

## 2017-12-17 NOTE — Telephone Encounter (Signed)
Patient called back,I discussed result and management. She is already scheduled for PT.   She does not want to continue Naproxen which makes her drowsy. Ibuprofen does not help.  I recommended d/c Naproxen and Ibuprofen. Will switch to a different NSAID.

## 2017-12-17 NOTE — Addendum Note (Signed)
Addended by: Andrena Mews T on: 12/17/2017 02:53 PM   Modules accepted: Orders

## 2017-12-17 NOTE — Telephone Encounter (Signed)
Unable to discuss xray result with her. She will benefit from PT and NSAID. Consider spine specialist in the future if no improvement.     Mr Lumbar Spine W Wo Contrast  Result Date: 12/16/2017 CLINICAL DATA:  Chronic low back pain with left leg pain. EXAM: MRI LUMBAR SPINE WITHOUT AND WITH CONTRAST TECHNIQUE: Multiplanar and multiecho pulse sequences of the lumbar spine were obtained without and with intravenous contrast. CONTRAST:  21mL MULTIHANCE GADOBENATE DIMEGLUMINE 529 MG/ML IV SOLN COMPARISON:  Radiography 10/09/2017 FINDINGS: Segmentation:  5 lumbar type vertebral bodies. Alignment:  Normal Vertebrae:  No fracture or primary lesion. Conus medullaris and cauda equina: Conus extends to the L1-2 level. Conus and cauda equina appear normal. Paraspinal and other soft tissues: Normal Disc levels: No abnormality at L L3-4 or above. The discs are normal. The canal and foramina are widely patent. L4-5: No disc abnormality. Bilateral facet osteoarthritis left worse than right. No stenosis. The facet arthropathy could be a cause of back pain or referred facet syndrome pain. L5-S1: Annular bulging with small annular fissure. No stenosis or neural compression. Minimal facet degeneration at this level. 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 This could be associated with back pain or referred facet syndrome pain. No stenosis or neural compression at this level. Mild disc degeneration at L5-S1 with annular bulging and a small annular fissure. No stenosis or neural compression. Minimal facet arthritis. Electronically Signed   By: Nelson Chimes M.D.   On: 12/16/2017 19:30

## 2017-12-20 ENCOUNTER — Encounter: Payer: Self-pay | Admitting: Obstetrics & Gynecology

## 2017-12-23 ENCOUNTER — Ambulatory Visit (INDEPENDENT_AMBULATORY_CARE_PROVIDER_SITE_OTHER): Payer: Self-pay

## 2017-12-23 DIAGNOSIS — Z111 Encounter for screening for respiratory tuberculosis: Secondary | ICD-10-CM

## 2017-12-23 NOTE — Progress Notes (Signed)
   Patient here for a TB skin test. Placed on left forearm. Patient tolerated well. Appt made for Wednesday, 12/25/17 at 0930 for PPD reading. Appt card given. Danley Danker, RN [facility] RN)

## 2017-12-25 ENCOUNTER — Ambulatory Visit (INDEPENDENT_AMBULATORY_CARE_PROVIDER_SITE_OTHER): Payer: Self-pay

## 2017-12-25 DIAGNOSIS — Z111 Encounter for screening for respiratory tuberculosis: Secondary | ICD-10-CM

## 2017-12-25 LAB — TB SKIN TEST
Induration: 0 mm
TB SKIN TEST: NEGATIVE

## 2017-12-25 NOTE — Progress Notes (Signed)
   Patient here today to have PPD site read.   PPD read and results entered in [place]. Letter given to patient for employer. Result: 0 mm induration. Interpretation: Negative Danley Danker, RN [facility] RN)

## 2017-12-30 ENCOUNTER — Ambulatory Visit: Payer: Medicaid Other | Attending: Family Medicine | Admitting: Physical Therapy

## 2018-01-02 ENCOUNTER — Telehealth: Payer: Self-pay | Admitting: Family Medicine

## 2018-01-02 NOTE — Telephone Encounter (Signed)
Pt is needing a letter to take to her job stating she has arthritis in her back.  Dr Walden Field also said there was something else in her back.  Please advise

## 2018-01-03 NOTE — Telephone Encounter (Signed)
Spoke with patient and she states that she has been having to miss an occasional day of work due to her back pain.  She is waiting for human resources to send her FMLA papers to have filled out but in the meantime just needs a letter stating that she has this diagnosis and it has prevented her from going into work a few times.  Patient states that they work off of a point system for attendance and would just need this letter to cover her until she is able to bring in official paperwork.  I also informed her that PCP is out of office and this will be addressed by a covering provider.  She voiced understanding. Cresta Riden,CMA

## 2018-01-03 NOTE — Telephone Encounter (Signed)
This is complex as Dr. Gwendlyn Deutscher is out so she would do best to bring her FMLA paperwork in and be seen by another provider. I cannot send in a letter saying she may be out of work due toher back as there is no mention of hat in the notes from Dr. Gwendlyn Deutscher  Please let her know Dorcas Mcmurray

## 2018-01-03 NOTE — Telephone Encounter (Signed)
Blue team I am unclear whatt he note is for? Is it for restrictions? Or is it just a notification.

## 2018-01-06 NOTE — Telephone Encounter (Signed)
LM for patient to call back.  Please assist her in making an appointment to get a letter explaining her back pain and work restrictions by another provider since her PCP is out of office. Diane Harvey,CMA

## 2018-01-21 ENCOUNTER — Telehealth: Payer: Self-pay | Admitting: Family Medicine

## 2018-01-21 NOTE — Telephone Encounter (Signed)
FMLA form dropped off for at front desk for completion.  Verified that patient section of form has been completed.  Last DOS with PCP was 12/10/17.  Placed form in  [facility] team folder to be completed by clinical staff.  Diane Harvey

## 2018-01-22 NOTE — Telephone Encounter (Signed)
Clinical info completed on fmla form.  Place form in Dr. Eniola's box for completion.  HARTSELL,  JAZMIN, CMA   

## 2018-01-23 NOTE — Telephone Encounter (Signed)
LM for patient asking her to call back and schedule an appointment to discuss her FMLA papers.  Please assist her with this when she calls back.  Thanks [employer]

## 2018-01-23 NOTE — Telephone Encounter (Signed)
appt on 3/21

## 2018-01-23 NOTE — Telephone Encounter (Signed)
Patient not seen since Jan.  Was instructed to follow up after MRI  Please let her know Would need to be seen in order to review paperwork to see if would be able to sign  Thanks  She was instructed to make an appointment in 2/21 for a letter completion

## 2018-01-30 ENCOUNTER — Other Ambulatory Visit: Payer: Self-pay

## 2018-01-30 ENCOUNTER — Telehealth: Payer: Self-pay

## 2018-01-30 ENCOUNTER — Encounter: Payer: Self-pay | Admitting: Family Medicine

## 2018-01-30 ENCOUNTER — Ambulatory Visit (INDEPENDENT_AMBULATORY_CARE_PROVIDER_SITE_OTHER): Payer: Self-pay | Admitting: Family Medicine

## 2018-01-30 VITALS — BP 128/80

## 2018-01-30 DIAGNOSIS — G8929 Other chronic pain: Secondary | ICD-10-CM

## 2018-01-30 DIAGNOSIS — M5442 Lumbago with sciatica, left side: Secondary | ICD-10-CM

## 2018-01-30 NOTE — Progress Notes (Signed)
Subjective:    Patient ID: Diane Harvey, female    DOB: 12-29-1980, 37 y.o.   MRN: [id number]   CC: FMLA form need to be filled out  HPI: Patient is a 37 yo female with a past medical history significant for arthritis who presents today to have FMLA papers filled out. 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 Patient reportsshe was told she needed FMLA papers from her PCP in order to justify absence otherwise she would be at risk of dismissal from her job. Patient has previoulsy discuss her condition with PCP and was given a few excuse notes missing work.  Smoking status reviewed   ROS: all other systems were reviewed and are negative other than in the HPI   Past Medical History:  Diagnosis Date  . Bilateral knee pain 06/22/2014  . Contraception 07/31/2011   Orthotricyclen 07/19/15   . Knee pain 07/31/2011   See orthopedist for this.  Unknown name.  Has tried injections with good results.  Plans on returning.   . Supervision of other normal pregnancy 04/07/2015    Clinic/provider [facility]) Prenatal Labs  Dating LMP and 21w u/s Blood type: O/POS/-- (02/16 1350)   Genetic Screen declined Antibody:NEG (02/16 1350)  Anatomic [place] HC<3rd %ile at 25w Rubella: 2.16 (02/16 1350)  GTT Early: 125               Third trimester: 3 hour normal RPR: NON REAC (05/26 0909)   Flu vaccine Off season HBsAg: NEGATIVE (02/16 1350)   TDaP vaccine 04/07/15                                              Rhogam: not indicated HIV: NONREACTIVE (05/26 0909)   GBS      Negative                                (For PCN allergy, check sensitivities) GBS:NOT DETECTED (07/01 1440)  Contraception BTL - consent signed 04/07/15; Changed mind and wants Depo Pap: 09/26/12 - negative w/ +HR HPV, needs repeat pap postpartum  Baby Food Bottle   Circumcision N/a   Pediatrician [facility]   Support Person FOB         Past Surgical  History:  Procedure Laterality Date  . CARPAL TUNNEL RELEASE    . CESAREAN SECTION N/A 06/09/2015   Procedure: CESAREAN SECTION;  Surgeon: Osborne Oman, MD;  Location: [facility];  Service: Obstetrics;  Laterality: N/A;    Past medical history, surgical, family, and social history reviewed and updated in the EMR as appropriate.  Objective:  There were no vitals taken for this visit.  Vitals and nursing note reviewed  General: NAD, pleasant, able to participate in exam Cardiac: RRR, normal heart sounds, no murmurs. 2+ radial and PT pulses bilaterally Respiratory: CTAB, normal effort, No wheezes, rales or rhonchi Abdomen: soft, nontender, nondistended, no hepatic or splenomegaly, +BS Extremities: no edema or cyanosis. WWP. Skin: warm and dry, no rashes noted Neuro: alert and oriented x4, no focal deficits Psych: Normal affect and mood   Assessment & Plan:   #FMLA  Forms Patient presents today requesting FMLA papers for arthritis. Upon further  evaluation of patient records, imaging findings ar e not consistent with severity of patient symptoms. Patient's PCP being on maternity leave I will fill form until PCP returns to work and reevaluate appropriateness of her request.    Marjie Skiff, MD [place] PGY-2

## 2018-01-30 NOTE — Telephone Encounter (Signed)
Pt contacted and informed we can not find her FMLA papers. Pt was not upset as she knows pcp has been unavailable. Pt will have HR refax [place] a copy to promptly fill out.

## 2018-01-31 ENCOUNTER — Ambulatory Visit: Payer: Self-pay | Admitting: Obstetrics & Gynecology

## 2018-01-31 ENCOUNTER — Encounter (HOSPITAL_COMMUNITY): Payer: Self-pay

## 2018-01-31 ENCOUNTER — Encounter: Payer: Self-pay | Admitting: Obstetrics & Gynecology

## 2018-01-31 VITALS — BP 161/96 | HR 81 | Ht 67.0 in | Wt 220.7 lb

## 2018-01-31 DIAGNOSIS — Z3009 Encounter for other general counseling and advice on contraception: Secondary | ICD-10-CM | POA: Insufficient documentation

## 2018-01-31 DIAGNOSIS — Z309 Encounter for contraceptive management, unspecified: Secondary | ICD-10-CM | POA: Diagnosis not present

## 2018-01-31 NOTE — Progress Notes (Signed)
   Subjective:    Patient ID: Diane Harvey, female    DOB: 08-04-1981, 37 y.o.   MRN: [id number]  HPI 37 yo engaged P4 (53, 22, 20, and 83 yo kids) here today with her fiance to discuss her desire for permanent sterility. Fiance refuses vasectomy. They are CERTAIN that they don't want more kids. She is currently on OCPs.   Review of Systems Pap smear UTD and normal.    Objective:   Physical Exam Breathing, conversing, and ambulating normally Well nourished, well hydrated Black female, no apparent distress Abd- benign   Assessment & Plan:  Desire for permanent sterility Medicaid form sign properly today I will sent Jordan a message to schedule this. She understands the permanence as well as the 1 in 400 failure rate

## 2018-01-31 NOTE — Telephone Encounter (Signed)
Will forward to Dr. Andy Gauss who saw patient yesterday and will be the one completing the forms for her. Shaquelle Hernon,CMA

## 2018-01-31 NOTE — Telephone Encounter (Signed)
done

## 2018-01-31 NOTE — Telephone Encounter (Signed)
Patient aware forms ready for pick up . Copy made for scanning. [employer] at front desk. Danley Danker, RN [facility] RN)

## 2018-01-31 NOTE — Telephone Encounter (Signed)
Patient actually is upset that forms were lost first time and needs this second copy done asap please.  Please let her know when finished.  I will place in Dr. Andria Frames box as I believe he is covering for Dr. Gwendlyn Deutscher.

## 2018-02-03 NOTE — Telephone Encounter (Signed)
FMLA paperwork already completed.

## 2018-02-07 ENCOUNTER — Other Ambulatory Visit: Payer: Self-pay

## 2018-02-07 ENCOUNTER — Ambulatory Visit: Payer: Self-pay | Attending: Family Medicine | Admitting: Physical Therapy

## 2018-02-07 ENCOUNTER — Encounter: Payer: Self-pay | Admitting: Physical Therapy

## 2018-02-07 DIAGNOSIS — R262 Difficulty in walking, not elsewhere classified: Secondary | ICD-10-CM | POA: Insufficient documentation

## 2018-02-07 DIAGNOSIS — G8929 Other chronic pain: Secondary | ICD-10-CM | POA: Insufficient documentation

## 2018-02-07 DIAGNOSIS — M5442 Lumbago with sciatica, left side: Secondary | ICD-10-CM | POA: Insufficient documentation

## 2018-02-07 DIAGNOSIS — M6281 Muscle weakness (generalized): Secondary | ICD-10-CM | POA: Insufficient documentation

## 2018-02-07 NOTE — Therapy (Addendum)
[place], [place], [address] Phone: [phone]   Fax:  [phone]  Physical Therapy Evaluation/Discharge   Patient Details  Name: Diane Harvey MRN: [id number] Date of Birth: June 27, 1981 Referring Provider: Andrena Mews    Encounter Date: 02/07/2018  PT End of Session - 02/07/18 1032    Visit Number  1    Number of Visits  4    Date for PT Re-Evaluation  03/07/18    Authorization Type  MCD    PT Start Time  0928    PT Stop Time  1009    PT Time Calculation (min)  41 min    Activity Tolerance  Patient tolerated treatment well    Behavior During Therapy  [facility] for tasks assessed/performed       Past Medical History:  Diagnosis Date  . Back pain   . Bilateral knee pain 06/22/2014  . Contraception 07/31/2011   Orthotricyclen 07/19/15   . Knee pain 07/31/2011   See orthopedist for this.  Unknown name.  Has tried injections with good results.  Plans on returning.   . Supervision of other normal pregnancy 04/07/2015    Clinic/provider [facility]) Prenatal Labs  Dating LMP and 21w u/s Blood type: O/POS/-- (02/16 1350)   Genetic Screen declined Antibody:NEG (02/16 1350)  Anatomic [place] HC<3rd %ile at 25w Rubella: 2.16 (02/16 1350)  GTT Early: 125               Third trimester: 3 hour normal RPR: NON REAC (05/26 0909)   Flu vaccine Off season HBsAg: NEGATIVE (02/16 1350)   TDaP vaccine 04/07/15                                              Rhogam: not indicated HIV: NONREACTIVE (05/26 0909)   GBS      Negative                                (For PCN allergy, check sensitivities) GBS:NOT DETECTED (07/01 1440)  Contraception BTL - consent signed 04/07/15; Changed mind and wants Depo Pap: 09/26/12 - negative w/ +HR HPV, needs repeat pap postpartum  Baby Food Bottle   Circumcision N/a   Pediatrician [facility]   Support Person FOB         Past Surgical History:  Procedure Laterality Date  . CARPAL  TUNNEL RELEASE    . CESAREAN SECTION N/A 06/09/2015   Procedure: CESAREAN SECTION;  Surgeon: Osborne Oman, MD;  Location: [facility];  Service: Obstetrics;  Laterality: N/A;    There were no vitals filed for this visit.   Subjective Assessment - 02/07/18 0933    Subjective  Patient reports that her left sided lower back pain is a 6/10 today but sometimes gets up to a 10/10. Pt states that she has difficulty standing at her job for long periods of time and walking greater than 30 mins. Patient also states that she has difficulty with sudden movements,, bending forward, and getting up from her chair. Patient reports that wroking has been difficult due to this pain.    Limitations  Sitting;Standing;Walking    How long can you stand comfortably?  <30 mins     How long can you walk comfortably?  <30  mins    Patient Stated Goals  Working without pain, Standing for greater than 30 mins without pain     Currently in Pain?  Yes    Pain Score  6     Pain Location  Back    Pain Orientation  Left    Pain Descriptors / Indicators  Aching;Sharp;Shooting    Pain Type  Chronic pain    Pain Radiating Towards  occasionally radiates down left LE    Pain Onset  More than a month ago    Pain Frequency  Constant    Aggravating Factors   Prolonged standing, bending forward    Pain Relieving Factors  Ice or heat    Effect of Pain on Daily Activities  difficulty working          [facility] PT Assessment - 02/07/18 0001      Assessment   Medical Diagnosis  Low back pain     Referring Provider  Kehinde Eniola     Onset Date/Surgical Date  -- year and half    Next MD Visit  unknown     Prior Therapy  yes      Precautions   Precautions  None      Restrictions   Weight Bearing Restrictions  No      Balance Screen   Has the patient fallen in the past 6 months  No    Has the patient had a decrease in activity level because of a fear of falling?   No    Is the patient reluctant to leave their home because  of a fear of falling?   No      Home Environment   Living Environment  Private residence    [place] to enter    Home Layout  Two level      Prior Function   Level of Independence  Independent    Vocation  Full time employment    Vocation Requirements  Standing      Cognition   Overall Cognitive Status  Within Functional Limits for tasks assessed    Attention  Focused    Focused Attention  Appears intact    Memory  Appears intact    Awareness  Appears intact    Problem Solving  Appears intact      Observation/Other Assessments   Observations  patient sat slouched to the right side       Sensation   Light Touch  Appears Intact    Additional Comments  Pain can radiate at times into the left leg       Coordination   Gross Motor Movements are Fluid and Coordinated  Yes    Fine Motor Movements are Fluid and Coordinated  Yes      Posture/Postural Control   Posture/Postural Control  Postural limitations    Posture Comments  rounded shoulders; Hyperlordodic in standing; large gluteals;       ROM / Strength   AROM / PROM / Strength  AROM;PROM;Strength      AROM   AROM Assessment Site  Lumbar    Lumbar Flexion  25    Lumbar Extension  7    Lumbar - Right Side Bend  less pain on the right     Lumbar - Left Side Bend  Pain going to the left     Lumbar - Right Rotation  no pain     Lumbar - Left Rotation  minor pain        PROM   PROM Assessment Site  Hip    Right/Left Hip  Left;Right    Left Hip Flexion  40    Left Hip External Rotation   -- Increased pain       Strength   Strength Assessment Site  Hip;Knee    Right/Left Hip  Left;Right    Right Hip Flexion  5/5    Left Hip Flexion  4+/5    Right/Left Knee  Right;Left    Right Knee Flexion  5/5    Right Knee Extension  5/5    Left Knee Flexion  4/5    Left Knee Extension  4/5      Right Hip   Right Hip Flexion  42      Palpation   Palpation comment  central  tenderness to palpation with increased pain towards the left paraspinal      Bed Mobility   Bed Mobility  -- Painful lying in supine       Ambulation/Gait   Ambulation/Gait  Yes    Gait Comments  Left hip elevated; decreased hip flexion              No data recorded  Objective measurements completed on examination: See above findings.      [facility] PT Treatment/Exercise - 02/07/18 0001      Lumbar Exercises: Stretches   Passive Hamstring Stretch Limitations  reviewed on HEP     Single Knee to Chest Stretch Limitations  reviewed on HEP     Pelvic Tilt  10 reps    Other Lumbar Stretch Exercise  Prayer stretch; standing; 3x 20 sec      Lumbar Exercises: Seated   Other Seated Lumbar Exercises  TrA breathing/contraction; x10    Other Seated Lumbar Exercises  clamshell and ball squeezed reviewed on HEP              PT Education - 02/07/18 1032    Education provided  Yes    Education Details  Symptom management; Exercise technique; HEP review    Person(s) Educated  Patient    Methods  Explanation;Demonstration;Tactile cues;Verbal cues    Comprehension  Verbalized understanding;Returned demonstration;Need further instruction       PT Short Term Goals - 02/07/18 1058      PT SHORT TERM GOAL #1   Title  Pt will increase bilateral hip mobility to 90 degrees without pain.    Baseline  40 degrees on right; 42 degrees on left    Time  2    Period  Weeks    Status  New    Target Date  02/21/18      PT SHORT TERM GOAL #2   Title  Pt will demonstrate left sided gross hip strength of a 5/5.     Baseline  4+/5    Time  2    Period  Weeks    Status  New    Target Date  02/21/18      PT SHORT TERM GOAL #3   Title  Patient will rotate and side bend  trunk to the left without pain at end range.     Baseline  Pain at end range    Time  2    Period  Weeks    Status  New    Target Date  02/21/18      PT SHORT TERM GOAL #4   Title  Pt will demonstrate  full  lumbar extension and flexion without pain.     Baseline  Flexion 25 degrees; Extension 8 degrees    Time  2    Period  Weeks    Status  New    Target Date  02/21/18        PT Long Term Goals - 02/07/18 1104      PT LONG TERM GOAL #1   Title  Patient will  report <2/10 pain in her left lower back when standing longer than 30 mins at work.     Baseline  Pain 6/10    Time  4    Period  Weeks    Status  New    Target Date  03/07/18      PT LONG TERM GOAL #2   Title  Pt will increase bilateral hip mobility in order to sit in a chair for greater than 30 mins with pain <2/10.     Baseline  Pain at 40 degrees of hip flexion     Time  4    Period  Weeks    Status  New    Target Date  03/07/18      PT LONG TERM GOAL #3   Title  Pt will demonstrate full lumbar mobility in order to perfrom ADLs without pain.     Baseline  Lumbar flexion 25 degrees; Lumbar extension 8 degrees    Time  4    Period  Weeks    Status  New    Target Date  03/07/18             Plan - 02/07/18 1035    Clinical Impression Statement  Patient is a 37 year old female reporting to physical therapy for left sided lower back pain. Pt displays decreased left side hip strength and decreased bilateral hip mobility which is limited due to pain. Pt has decreased lumbar flexion and extension due to pain. Pt reports pain when side bending and rotating trunk to the left. Pt also has decreased lumbar mobility at L4/L5 with pain to passive accessory assesment of L4 spinous process and L4 left facet. Pt will benefit from skilled physical therapy in order to increase hip and lumbar mobility, increase hip strength, and decrease lower back pain.      History and Personal Factors relevant to plan of care:  Lumbar arthritis; Knee OA    Clinical Presentation  Stable    Clinical Decision Making  Low    Rehab Potential  Good    PT Frequency  1x / week    PT Duration  4 weeks    PT Treatment/Interventions  ADLs/Self Care Home  Management;Electrical Stimulation;Iontophoresis 80m/ml Dexamethasone;Moist Heat;Traction;Ultrasound;Gait training;Stair training;Therapeutic activities;Therapeutic exercise;Neuromuscular re-education;Patient/family education;Orthotic Fit/Training;Manual techniques;Passive range of motion;Dry needling;Taping;Vasopneumatic Device    PT Next Visit Plan  TrA breathing with march, Clamshell, lumbar rocking, hamstring stretch, piriformis stretch, grades 3-4 PA mobilizations at L4/L5.    PT Home Exercise Plan  TrA breathing, Posterior pelvic tilt, Prayer stretch/ gave patient and educated patient on more advanced plan if she can not return 2ncd to insurance     Consulted and Agree with Plan of Care  Patient       Patient will benefit from skilled therapeutic intervention in order to improve the following deficits and impairments:  Decreased range of motion, Decreased mobility, Difficulty walking, Pain, Decreased strength, Hypomobility  Visit Diagnosis: Chronic left-sided low back pain with left-sided sciatica  Muscle weakness (generalized)  Difficulty in walking,  not elsewhere classified   PHYSICAL THERAPY DISCHARGE SUMMARY  Visits from Start of Care: 1  Current functional level related to goals / functional outcomes: Did not return for follow up    Remaining deficits: Unknown   Education / Equipment: Unknown   Plan: Patient agrees to discharge.  Patient goals were not met. Patient is being discharged due to not returning since the last visit.  ?????       Problem List Patient Active Problem List   Diagnosis Date Noted  . Unwanted fertility 01/31/2018  . Chronic knee pain 11/18/2015  . Chronic low back pain with left-sided sciatica 08/30/2015  . Hemorrhoid 05/20/2015  . Carpal tunnel syndrome 10/27/2013  . Obesity 09/26/2012    Carolyne Littles PT DPT  02/07/2018  During this treatment session, the therapist was present, participating in and directing the treatment.  Cooper Render SPT  02/07/2018, 12:24 PM  [facility] [address] [place], [place], [address] Phone: [phone]   Fax:  [phone]  Name: Diane Harvey MRN: [id number] Date of Birth: Mar 16, 1981

## 2018-02-07 NOTE — Therapy (Signed)
[place], [place], [address] Phone: [phone]   Fax:  [phone]  Physical Therapy Evaluation  Patient Details  Name: MAKANA FEIGEL MRN: [id number] Date of Birth: June 09, 1981 Referring Provider: Andrena Mews    Encounter Date: 02/07/2018  PT End of Session - 02/07/18 1032    Visit Number  1    Number of Visits  4    Date for PT Re-Evaluation  03/07/18    Authorization Type  MCD    PT Start Time  0928    PT Stop Time  1009    PT Time Calculation (min)  41 min    Activity Tolerance  Patient tolerated treatment well    Behavior During Therapy  [facility] for tasks assessed/performed       Past Medical History:  Diagnosis Date  . Back pain   . Bilateral knee pain 06/22/2014  . Contraception 07/31/2011   Orthotricyclen 07/19/15   . Knee pain 07/31/2011   See orthopedist for this.  Unknown name.  Has tried injections with good results.  Plans on returning.   . Supervision of other normal pregnancy 04/07/2015    Clinic/provider [facility]) Prenatal Labs  Dating LMP and 21w u/s Blood type: O/POS/-- (02/16 1350)   Genetic Screen declined Antibody:NEG (02/16 1350)  Anatomic [place] HC<3rd %ile at 25w Rubella: 2.16 (02/16 1350)  GTT Early: 125               Third trimester: 3 hour normal RPR: NON REAC (05/26 0909)   Flu vaccine Off season HBsAg: NEGATIVE (02/16 1350)   TDaP vaccine 04/07/15                                              Rhogam: not indicated HIV: NONREACTIVE (05/26 0909)   GBS      Negative                                (For PCN allergy, check sensitivities) GBS:NOT DETECTED (07/01 1440)  Contraception BTL - consent signed 04/07/15; Changed mind and wants Depo Pap: 09/26/12 - negative w/ +HR HPV, needs repeat pap postpartum  Baby Food Bottle   Circumcision N/a   Pediatrician [facility]   Support Person FOB         Past Surgical History:  Procedure Laterality Date  . CARPAL TUNNEL  RELEASE    . CESAREAN SECTION N/A 06/09/2015   Procedure: CESAREAN SECTION;  Surgeon: Osborne Oman, MD;  Location: [facility];  Service: Obstetrics;  Laterality: N/A;    There were no vitals filed for this visit.   Subjective Assessment - 02/07/18 0933    Subjective  Patient reports that her left sided lower back pain is a 6/10 today but sometimes gets up to a 10/10. Pt states that she has difficulty standing at her job for long periods of time and walking greater than 30 mins. Patient also states that she has difficulty with sudden movements,, bending forward, and getting up from her chair. Patient reports that wroking has been difficult due to this pain.    Limitations  Sitting;Standing;Walking    How long can you stand comfortably?  <30 mins     How long can you walk comfortably?  <30 mins  Patient Stated Goals  Working without pain, Standing for greater than 30 mins without pain     Currently in Pain?  Yes    Pain Score  6     Pain Location  Back    Pain Orientation  Left    Pain Descriptors / Indicators  Aching;Sharp;Shooting    Pain Type  Chronic pain    Pain Radiating Towards  occasionally radiates down left LE    Pain Onset  More than a month ago    Pain Frequency  Constant    Aggravating Factors   Prolonged standing, bending forward    Pain Relieving Factors  Ice or heat         [facility] PT Assessment - 02/07/18 0001      Assessment   Medical Diagnosis  Low back pain     Referring Provider  Kehinde Eniola     Onset Date/Surgical Date  -- year and half    Next MD Visit  unknown     Prior Therapy  yes      Precautions   Precautions  None      Restrictions   Weight Bearing Restrictions  No      Balance Screen   Has the patient fallen in the past 6 months  No    Has the patient had a decrease in activity level because of a fear of falling?   No    Is the patient reluctant to leave their home because of a fear of falling?   No      Home Environment   Living  Environment  Private residence    [place] to enter    Home Layout  Two level      Prior Function   Level of Independence  Independent    Vocation  Full time employment    Vocation Requirements  Standing      Cognition   Overall Cognitive Status  Within Functional Limits for tasks assessed    Attention  Focused    Focused Attention  Appears intact    Memory  Appears intact    Awareness  Appears intact    Problem Solving  Appears intact      ROM / Strength   AROM / PROM / Strength  AROM;PROM;Strength      AROM   AROM Assessment Site  Lumbar    Lumbar Flexion  25    Lumbar Extension  7    Lumbar - Right Side Bend  less pain on the right     Lumbar - Left Side Bend  Pain going to the left     Lumbar - Right Rotation  no pain     Lumbar - Left Rotation  minor pain        PROM   PROM Assessment Site  Hip    Right/Left Hip  Left;Right    Left Hip Flexion  40    Left Hip External Rotation   -- Increased pain       Strength   Strength Assessment Site  Hip;Knee    Right/Left Hip  Left;Right    Right Hip Flexion  5/5    Left Hip Flexion  4+/5      Right Hip   Right Hip Flexion  42      Palpation   Palpation comment  central tenderness to palpation with increased pain towards the left paraspinal  Ambulation/Gait   Gait Comments  Left hip elevated; bilateral decreased hip flexion              No data recorded  Objective measurements completed on examination: See above findings.      [facility] Adult PT Treatment/Exercise - 02/07/18 0001      Lumbar Exercises: Stretches   Pelvic Tilt  10 reps    Other Lumbar Stretch Exercise  Prayer stretch; standing; 3x 20 sec      Lumbar Exercises: Seated   Other Seated Lumbar Exercises  TrA breathing/contraction; x10             PT Education - 02/07/18 1032    Education provided  Yes    Education Details  Symptom management; Exercise technique; HEP review     Person(s) Educated  Patient    Methods  Explanation;Demonstration;Tactile cues;Verbal cues    Comprehension  Verbalized understanding;Returned demonstration;Need further instruction       PT Short Term Goals - 02/07/18 1058      PT SHORT TERM GOAL #1   Title  Pt will increase bilateral hip mobility to 90 degrees without pain.    Baseline  40 degrees on right; 42 degrees on left    Time  2    Period  Weeks    Status  New    Target Date  02/21/18      PT SHORT TERM GOAL #2   Title  Pt will demonstrate left sided gross hip strength of a 5/5.     Baseline  4+/5    Time  2    Period  Weeks    Status  New    Target Date  02/21/18      PT SHORT TERM GOAL #3   Title  Patient will rotate and side bend  trunk to the left without pain at end range.     Baseline  Pain at end range    Time  2    Period  Weeks    Status  New    Target Date  02/21/18      PT SHORT TERM GOAL #4   Title  Pt will demonstrate full lumbar extension and flexion without pain.     Baseline  Flexion 25 degrees; Extension 8 degrees    Time  2    Period  Weeks    Status  New    Target Date  02/21/18        PT Long Term Goals - 02/07/18 1104      PT LONG TERM GOAL #1   Title  Patient will  report <2/10 pain in her left lower back when standing longer than 30 mins at work.     Baseline  Pain 6/10    Time  4    Period  Weeks    Status  New    Target Date  03/07/18      PT LONG TERM GOAL #2   Title  Pt will increase bilateral hip mobility in order to sit in a chair for greater than 30 mins with pain <2/10.     Time  4    Period  Weeks    Status  New    Target Date  03/07/18      PT LONG TERM GOAL #3   Title  Pt will demonstrate full lumbar mobility in order to perfrom ADLs without pain.     Time  4    Period  Weeks  Status  New    Target Date  03/07/18             Plan - 02/07/18 1035    Clinical Impression Statement  Patient is a 37 year old female reporting to physical therapy for  left sided lower back pain. Pt displays decreased left side hip strength and decreased bilateral hip mobility which is limited due to pain. Pt has decreased lumbar flexion and extension due to pain. Pt reports pain when side bending and rotating trunk to the left. Pt also has decreased lumbar mobility at L4/L5 with pain to passive accessory assesment of L4 spinous process and L4 left facet. Pt will benefit from skilled physical therapy in order to increase hip and lumbar mobility, increase hip strength, and decrease lower back pain.      History and Personal Factors relevant to plan of care:  Knee OA    Clinical Presentation  Stable    Clinical Decision Making  Low    Rehab Potential  Good    PT Frequency  1x / week    PT Duration  4 weeks    PT Treatment/Interventions  ADLs/Self Care Home Management;Electrical Stimulation;Iontophoresis 4mg /ml Dexamethasone;Moist Heat;Traction;Ultrasound;Gait training;Stair training;Therapeutic activities;Therapeutic exercise;Neuromuscular re-education;Patient/family education;Orthotic Fit/Training;Manual techniques;Passive range of motion;Dry needling;Taping;Vasopneumatic Device    PT Next Visit Plan  TrA breathing with march, Clamshell, lumbar rocking, hamstring stretch, piriformis stretch, grades 3-4 PA mobilizations at L4/L5.    PT Home Exercise Plan  TrA breathing, Posterior pelvic tilt, Prayer stretch    Consulted and Agree with Plan of Care  Patient       Patient will benefit from skilled therapeutic intervention in order to improve the following deficits and impairments:  Decreased range of motion, Decreased mobility, Difficulty walking, Pain, Decreased strength, Hypomobility  Visit Diagnosis: Chronic left-sided low back pain with left-sided sciatica  Muscle weakness (generalized)  Difficulty in walking, not elsewhere classified     Problem List Patient Active Problem List   Diagnosis Date Noted  . Unwanted fertility 01/31/2018  . Chronic knee  pain 11/18/2015  . Chronic low back pain with left-sided sciatica 08/30/2015  . Hemorrhoid 05/20/2015  . Carpal tunnel syndrome 10/27/2013  . Obesity 09/26/2012    Cooper Render 02/07/2018, 11:58 AM  [facility] [address] [place], [place], [address] Phone: [phone]   Fax:  [phone]  Name: KATELAND LEISINGER MRN: [id number] Date of Birth: 14-Mar-1981

## 2018-02-10 ENCOUNTER — Encounter: Payer: Self-pay | Admitting: *Deleted

## 2018-03-04 NOTE — Patient Instructions (Addendum)
Your procedure is scheduled on: Tuesday, May 14  Enter through the Main Entrance of [facility] at: 10:15 am  Pick up the phone at the desk and dial [phone].  Call this number if you have problems the morning of surgery: [phone].  Remember: Do NOT eat food or Do NOT drink clear liquids (including water) after midnight Monday  Take these medicines the morning of surgery with a SIP OF WATER: flexeril if needed  Stop herbal medications, vitamin supplements and ibuprofen 1 week prior to surgery.  Do NOT wear jewelry (body piercing), metal hair clips/bobby pins, make-up, or nail polish. Do NOT wear lotions, powders, or perfumes.  You may wear deoderant. Do NOT shave for 48 hours prior to surgery. Do NOT bring valuables to the hospital.  Have a responsible adult drive you home and stay with you for 24 hours after your procedure.  Home with Diane Harvey cell [phone]

## 2018-03-17 ENCOUNTER — Encounter (HOSPITAL_COMMUNITY)
Admission: RE | Admit: 2018-03-17 | Discharge: 2018-03-17 | Disposition: A | Discharge status: Home / Self Care | Payer: Medicaid Other | Source: Ambulatory Visit | Attending: Obstetrics & Gynecology | Admitting: Obstetrics & Gynecology

## 2018-03-17 ENCOUNTER — Other Ambulatory Visit: Payer: Self-pay

## 2018-03-17 ENCOUNTER — Encounter (HOSPITAL_COMMUNITY): Payer: Self-pay

## 2018-03-17 DIAGNOSIS — Z01812 Encounter for preprocedural laboratory examination: Secondary | ICD-10-CM | POA: Insufficient documentation

## 2018-03-17 HISTORY — DX: Anemia, unspecified: D64.9

## 2018-03-17 HISTORY — DX: Unspecified osteoarthritis, unspecified site: M19.90

## 2018-03-17 HISTORY — DX: Headache: R51

## 2018-03-17 HISTORY — DX: Headache, unspecified: R51.9

## 2018-03-17 LAB — CBC
HEMATOCRIT: 39.3 % (ref 36.0–46.0)
Hemoglobin: 12.8 g/dL (ref 12.0–15.0)
MCH: 26.7 pg (ref 26.0–34.0)
MCHC: 32.6 g/dL (ref 30.0–36.0)
MCV: 82 fL (ref 78.0–100.0)
PLATELETS: 323 10*3/uL (ref 150–400)
RBC: 4.79 MIL/uL (ref 3.87–5.11)
RDW: 14.8 % (ref 11.5–15.5)
WBC: 7.1 10*3/uL (ref 4.0–10.5)

## 2018-03-25 ENCOUNTER — Other Ambulatory Visit: Payer: Self-pay

## 2018-03-25 ENCOUNTER — Encounter (HOSPITAL_COMMUNITY): Payer: Self-pay | Admitting: *Deleted

## 2018-03-25 ENCOUNTER — Ambulatory Visit (HOSPITAL_COMMUNITY): Payer: Medicaid Other | Admitting: Certified Registered"

## 2018-03-25 ENCOUNTER — Ambulatory Visit (HOSPITAL_COMMUNITY)
Admission: RE | Admit: 2018-03-25 | Discharge: 2018-03-25 | Disposition: A | Discharge status: Home / Self Care | Payer: Medicaid Other | Source: Ambulatory Visit | Attending: Obstetrics & Gynecology | Admitting: Obstetrics & Gynecology

## 2018-03-25 ENCOUNTER — Encounter (HOSPITAL_COMMUNITY)
Admission: RE | Disposition: A | Discharge status: Home / Self Care | Payer: Self-pay | Source: Ambulatory Visit | Attending: Obstetrics & Gynecology

## 2018-03-25 ENCOUNTER — Encounter (HOSPITAL_COMMUNITY): Payer: Self-pay

## 2018-03-25 DIAGNOSIS — Z302 Encounter for sterilization: Secondary | ICD-10-CM | POA: Diagnosis not present

## 2018-03-25 HISTORY — PX: LAPAROSCOPIC TUBAL LIGATION: SHX1937

## 2018-03-25 LAB — PREGNANCY, URINE: Preg Test, Ur: NEGATIVE

## 2018-03-25 SURGERY — LIGATION, FALLOPIAN TUBE, LAPAROSCOPIC
Anesthesia: General | Site: Abdomen | Laterality: Bilateral

## 2018-03-25 MED ORDER — LACTATED RINGERS IV SOLN
INTRAVENOUS | Status: DC
  Administered 2018-03-25 (×2): via INTRAVENOUS

## 2018-03-25 MED ORDER — METOCLOPRAMIDE HCL 5 MG/ML IJ SOLN: 10.0000 mg | Freq: Once | INTRAMUSCULAR | Status: DC | PRN

## 2018-03-25 MED ORDER — MEPERIDINE HCL 25 MG/ML IJ SOLN
6.2500 mg | INTRAMUSCULAR | Status: DC | PRN
  Administered 2018-03-25 (×2): 6.25 mg via INTRAVENOUS

## 2018-03-25 MED ORDER — DEXAMETHASONE SODIUM PHOSPHATE 10 MG/ML IJ SOLN
INTRAMUSCULAR | Status: DC | PRN
  Administered 2018-03-25: 10 mg via INTRAVENOUS

## 2018-03-25 MED ORDER — OXYCODONE HCL 5 MG PO TABS
ORAL_TABLET | ORAL | Status: AC
  Filled 2018-03-25: qty 1

## 2018-03-25 MED ORDER — MIDAZOLAM HCL 2 MG/2ML IJ SOLN
INTRAMUSCULAR | Status: AC
  Filled 2018-03-25: qty 2

## 2018-03-25 MED ORDER — FENTANYL CITRATE (PF) 100 MCG/2ML IJ SOLN
INTRAMUSCULAR | Status: DC | PRN
  Administered 2018-03-25: 100 ug via INTRAVENOUS
  Administered 2018-03-25: 50 ug via INTRAVENOUS

## 2018-03-25 MED ORDER — SCOPOLAMINE 1 MG/3DAYS TD PT72
1.0000 | MEDICATED_PATCH | Freq: Once | TRANSDERMAL | Status: DC
  Administered 2018-03-25: 1.5 mg via TRANSDERMAL

## 2018-03-25 MED ORDER — ONDANSETRON HCL 4 MG/2ML IJ SOLN
INTRAMUSCULAR | Status: DC | PRN
  Administered 2018-03-25: 4 mg via INTRAVENOUS

## 2018-03-25 MED ORDER — LIDOCAINE HCL (CARDIAC) PF 100 MG/5ML IV SOSY
PREFILLED_SYRINGE | INTRAVENOUS | Status: DC | PRN
  Administered 2018-03-25: 80 mg via INTRAVENOUS

## 2018-03-25 MED ORDER — IBUPROFEN 600 MG PO TABS: 600.0000 mg | ORAL_TABLET | Freq: Four times a day (QID) | ORAL | 1 refills | Status: DC | PRN

## 2018-03-25 MED ORDER — SUGAMMADEX SODIUM 200 MG/2ML IV SOLN
INTRAVENOUS | Status: DC | PRN
  Administered 2018-03-25: 200 mg via INTRAVENOUS

## 2018-03-25 MED ORDER — BUPIVACAINE HCL (PF) 0.5 % IJ SOLN
INTRAMUSCULAR | Status: DC | PRN
  Administered 2018-03-25: 10 mL

## 2018-03-25 MED ORDER — FENTANYL CITRATE (PF) 100 MCG/2ML IJ SOLN
INTRAMUSCULAR | Status: AC
  Filled 2018-03-25: qty 2

## 2018-03-25 MED ORDER — OXYCODONE HCL 5 MG PO TABS
5.0000 mg | ORAL_TABLET | Freq: Once | ORAL | Status: AC
  Administered 2018-03-25: 5 mg via ORAL

## 2018-03-25 MED ORDER — SCOPOLAMINE 1 MG/3DAYS TD PT72
MEDICATED_PATCH | TRANSDERMAL | Status: AC
  Filled 2018-03-25: qty 1

## 2018-03-25 MED ORDER — FENTANYL CITRATE (PF) 100 MCG/2ML IJ SOLN
25.0000 ug | INTRAMUSCULAR | Status: DC | PRN
  Administered 2018-03-25 (×2): 50 ug via INTRAVENOUS

## 2018-03-25 MED ORDER — MEPERIDINE HCL 25 MG/ML IJ SOLN
INTRAMUSCULAR | Status: AC
  Filled 2018-03-25: qty 1

## 2018-03-25 MED ORDER — OXYCODONE-ACETAMINOPHEN 5-325 MG PO TABS: 1.0000 | ORAL_TABLET | ORAL | 0 refills | Status: DC | PRN

## 2018-03-25 MED ORDER — ACETAMINOPHEN 10 MG/ML IV SOLN
1000.0000 mg | Freq: Four times a day (QID) | INTRAVENOUS | Status: DC
  Administered 2018-03-25: 1000 mg via INTRAVENOUS

## 2018-03-25 MED ORDER — MIDAZOLAM HCL 2 MG/2ML IJ SOLN
INTRAMUSCULAR | Status: DC | PRN
  Administered 2018-03-25: 2 mg via INTRAVENOUS

## 2018-03-25 MED ORDER — PROPOFOL 10 MG/ML IV BOLUS
INTRAVENOUS | Status: DC | PRN
  Administered 2018-03-25: 200 mg via INTRAVENOUS

## 2018-03-25 MED ORDER — ACETAMINOPHEN 10 MG/ML IV SOLN
INTRAVENOUS | Status: AC
  Administered 2018-03-25: 1000 mg via INTRAVENOUS
  Filled 2018-03-25: qty 100

## 2018-03-25 MED ORDER — BUPIVACAINE HCL (PF) 0.5 % IJ SOLN
INTRAMUSCULAR | Status: AC
  Filled 2018-03-25: qty 30

## 2018-03-25 MED ORDER — ROCURONIUM BROMIDE 100 MG/10ML IV SOLN
INTRAVENOUS | Status: DC | PRN
  Administered 2018-03-25: 40 mg via INTRAVENOUS

## 2018-03-25 MED ORDER — KETOROLAC TROMETHAMINE 30 MG/ML IJ SOLN
INTRAMUSCULAR | Status: DC | PRN
  Administered 2018-03-25: 30 mg via INTRAVENOUS

## 2018-03-25 MED ORDER — LACTATED RINGERS IV SOLN: INTRAVENOUS | Status: DC

## 2018-03-25 MED ORDER — FENTANYL CITRATE (PF) 250 MCG/5ML IJ SOLN
INTRAMUSCULAR | Status: AC
  Filled 2018-03-25: qty 5

## 2018-03-25 SURGICAL SUPPLY — 26 items
ADH SKN CLS LQ APL DERMABOND (GAUZE/BANDAGES/DRESSINGS) ×1
CLIP FILSHIE TUBAL LIGA STRL (Clip) ×1 IMPLANT
DERMABOND ADHESIVE PROPEN (GAUZE/BANDAGES/DRESSINGS) ×1
DERMABOND ADVANCED .7 DNX6 (GAUZE/BANDAGES/DRESSINGS) IMPLANT
DRSG OPSITE POSTOP 3X4 (GAUZE/BANDAGES/DRESSINGS) IMPLANT
DURAPREP 26ML APPLICATOR (WOUND CARE) ×2 IMPLANT
GLOVE BIO SURGEON STRL SZ 6.5 (GLOVE) ×4 IMPLANT
GLOVE BIOGEL PI IND STRL 7.0 (GLOVE) ×1 IMPLANT
GLOVE BIOGEL PI INDICATOR 7.0 (GLOVE) ×1
GOWN STRL REUS W/TWL LRG LVL3 (GOWN DISPOSABLE) ×4 IMPLANT
NDL SAFETY ECLIPSE 18X1.5 (NEEDLE) ×1 IMPLANT
NEEDLE HYPO 18GX1.5 SHARP (NEEDLE) ×2
NEEDLE INSUFFLATION 120MM (ENDOMECHANICALS) ×2 IMPLANT
NS IRRIG 1000ML POUR BTL (IV SOLUTION) ×2 IMPLANT
PACK LAPAROSCOPY BASIN (CUSTOM PROCEDURE TRAY) ×2 IMPLANT
PACK TRENDGUARD 450 HYBRID PRO (MISCELLANEOUS) IMPLANT
PROTECTOR NERVE ULNAR (MISCELLANEOUS) ×4 IMPLANT
SHEARS HARMONIC ACE PLUS 36CM (ENDOMECHANICALS) IMPLANT
SLEEVE XCEL OPT CAN 5 100 (ENDOMECHANICALS) IMPLANT
SUT VICRYL 0 UR6 27IN ABS (SUTURE) ×2 IMPLANT
SUT VICRYL 4-0 PS2 18IN ABS (SUTURE) ×2 IMPLANT
SYSTEM CARTER THOMASON II (TROCAR) IMPLANT
TOWEL OR 17X24 6PK STRL BLUE (TOWEL DISPOSABLE) ×4 IMPLANT
TRENDGUARD 450 HYBRID PRO PACK (MISCELLANEOUS)
TROCAR OPTI TIP 5M 100M (ENDOMECHANICALS) IMPLANT
TROCAR XCEL DIL TIP R 11M (ENDOMECHANICALS) ×2 IMPLANT

## 2018-03-25 NOTE — Anesthesia Procedure Notes (Signed)
Procedure Name: Intubation Date/Time: 03/25/2018 11:41 AM Performed by: Kathie Rhodes, CRNA Pre-anesthesia Checklist: Patient identified, Emergency Drugs available, Suction available, Patient being monitored and Timeout performed Patient Re-evaluated:Patient Re-evaluated prior to induction Oxygen Delivery Method: Circle system utilized Preoxygenation: Pre-oxygenation with 100% oxygen Induction Type: IV induction Ventilation: Mask ventilation without difficulty Laryngoscope Size: Miller and 2 Grade View: Grade I Tube type: Oral Tube size: 7.0 mm Number of attempts: 1 Airway Equipment and Method: Stylet Placement Confirmation: ETT inserted through vocal cords under direct vision,  positive ETCO2,  CO2 detector and breath sounds checked- equal and bilateral Secured at: 21 cm Tube secured with: Tape Dental Injury: Teeth and Oropharynx as per pre-operative assessment

## 2018-03-25 NOTE — Anesthesia Preprocedure Evaluation (Signed)
Anesthesia Evaluation  Patient identified by MRN, date of birth, ID band Patient awake    Reviewed: Allergy & Precautions, NPO status , Patient's Chart, lab work & pertinent test results  Airway Mallampati: II  TM Distance: >3 FB Neck ROM: Full    Dental no notable dental hx.    Pulmonary neg pulmonary ROS,    Pulmonary exam normal breath sounds clear to auscultation       Cardiovascular negative cardio ROS Normal cardiovascular exam Rhythm:Regular Rate:Normal     Neuro/Psych negative neurological ROS  negative psych ROS   GI/Hepatic negative GI ROS, Neg liver ROS,   Endo/Other  negative endocrine ROS  Renal/GU negative Renal ROS  negative genitourinary   Musculoskeletal negative musculoskeletal ROS (+)   Abdominal   Peds negative pediatric ROS (+)  Hematology negative hematology ROS (+)   Anesthesia Other Findings   Reproductive/Obstetrics negative OB ROS                             Anesthesia Physical Anesthesia Plan  ASA: II  Anesthesia Plan: General   Post-op Pain Management:    Induction: Intravenous  PONV Risk Score and Plan: 4 or greater and Ondansetron, Dexamethasone, Midazolam and Scopolamine patch - Pre-op  Airway Management Planned: Oral ETT  Additional Equipment:   Intra-op Plan:   Post-operative Plan: Extubation in OR  Informed Consent: I have reviewed the patients History and Physical, chart, labs and discussed the procedure including the risks, benefits and alternatives for the proposed anesthesia with the patient or authorized representative who has indicated his/her understanding and acceptance.   Dental advisory given  Plan Discussed with: CRNA  Anesthesia Plan Comments:         Anesthesia Quick Evaluation  

## 2018-03-25 NOTE — Op Note (Signed)
03/25/2018  12:11 PM  PATIENT:  Diane Harvey  37 y.o. female  PRE-OPERATIVE DIAGNOSIS:  Undesired Fertility  POST-OPERATIVE DIAGNOSIS:  Undesired Fertility  PROCEDURE:  Procedure(s): LAPAROSCOPIC TUBAL LIGATION (Bilateral)  SURGEON:  Surgeon(s) and Role:    * Marquan Vokes C, MD - Primary  ANESTHESIA:   local and general  EBL: minimal  BLOOD ADMINISTERED:none  DRAINS: none   LOCAL MEDICATIONS USED:  MARCAINE     SPECIMEN:  No Specimen  DISPOSITION OF SPECIMEN:  N/A  COUNTS:  YES  TOURNIQUET:  * No tourniquets in log *  DICTATION: .Dragon Dictation  PLAN OF CARE: Discharge to home after PACU  PATIENT DISPOSITION:  PACU - hemodynamically stable.   Delay start of Pharmacological VTE agent (>24hrs) due to surgical blood loss or risk of bleeding: not applicable   The risks, benefits, alternatives of surgery were explained understood, accepted. All questions were answered. She understands the failure rate of 1/400. She declines alternative forms of birth control. Her urine pregnancy test was negative. She was taken to the operating room and general anesthesia was applied without complication. She was placed in dorsal lithotomy position. Her abdomen and vagina were prepped and draped in the usual sterile fashion. A time out procedure was done. A bimanual exam revealed a normal, size and shape mobile uterus with nonenlarged adnexa. A Hulka manipulator was placed on the cervix. Gloves were changed and attention was turned to the abdomen. Approximately 10 mL of 0.5% Marcaine was used to infiltrate the subcutaneous tissue at the umbilicus. A vertical 1 cm incision was made. A Veres needle was placed in the pelvis. Low-flow CO2 was used to insufflate the abdomen to approximately 3 L. After good pneumoperitoneum was established, a 11 mm trocar was placed. Laparoscopy confirmed correct placement. She was placed in the Trendelenburg position. Patient abdominal pressure was always less than  15. Her uterus ovaries and tubes appeared normal. There was no evidence of endometriosis. The oviducts were visually traced to their fimbriated end on each side. In the isthmic region of each oviduct, a Filshie clip was placed across the entire oviduct. There was no bleeding. The CO2 was allowed to escape from her abdomen. The umbilical fascia was closed with a figure of 0 Vicryl suture. No defects were palpable. The subcuticular closure was done with 4-0 Vicryl suture. The Hulka manipulator was removed. She was extubated and taken to recovery in stable condition. She tolerated the procedure well. The instrument, sponge, and needle counts were correct.

## 2018-03-25 NOTE — Anesthesia Postprocedure Evaluation (Signed)
Anesthesia Post Note  Patient: Diane Harvey  Procedure(s) Performed: LAPAROSCOPIC TUBAL LIGATION (Bilateral Abdomen)     Patient location during evaluation: PACU Anesthesia Type: General Level of consciousness: awake and alert Pain management: pain level controlled Vital Signs Assessment: post-procedure vital signs reviewed and stable Respiratory status: spontaneous breathing, nonlabored ventilation, respiratory function stable and patient connected to nasal cannula oxygen Cardiovascular status: blood pressure returned to baseline and stable Postop Assessment: no apparent nausea or vomiting Anesthetic complications: no    Last Vitals:  Vitals:   03/25/18 1456 03/25/18 1520  BP: 128/85 121/68  Pulse: 64 68  Resp:  18  Temp:  36.5 C  SpO2: 93% 98%    Last Pain:  Vitals:   03/25/18 1520  TempSrc:   PainSc: 3    Pain Goal: Patients Stated Pain Goal: 3 (03/25/18 1445)               Montez Hageman

## 2018-03-25 NOTE — Transfer of Care (Signed)
Immediate Anesthesia Transfer of Care Note  Patient: Diane Harvey  Procedure(s) Performed: LAPAROSCOPIC TUBAL LIGATION (Bilateral Abdomen)  Patient Location: PACU  Anesthesia Type:General  Level of Consciousness: awake, alert  and oriented  Airway & Oxygen Therapy: Patient Spontanous Breathing and Patient connected to nasal cannula oxygen  Post-op Assessment: Report given to RN, Post -op Vital signs reviewed and stable and Patient moving all extremities  Post vital signs: Reviewed and stable  Last Vitals:  Vitals Value Taken Time  BP    Temp    Pulse 81 03/25/2018 12:20 PM  Resp    SpO2 97 % 03/25/2018 12:20 PM  Vitals shown include unvalidated device data.  Last Pain:  Vitals:   03/25/18 1024  TempSrc: Oral  PainSc: 0-No pain      Patients Stated Pain Goal: 3 (03/50/09 3818)  Complications: No apparent anesthesia complications

## 2018-03-25 NOTE — H&P (Signed)
Diane Harvey is an 37 y.o. engaged P4 (17, 48, 34, and 69 yo kids) here today to achieve her desire for permanent sterility. Fiance refuses vasectomy. They are CERTAIN that they don't want more kids. She is currently on OCPs.    Patient's last menstrual period was 03/18/2018 (exact date).    Past Medical History:  Diagnosis Date  . Anemia   . Arthritis    lower back  . Back pain   . Bilateral knee pain 06/22/2014   No current problems as of 03/17/18  . Contraception 07/31/2011   Orthotricyclen 07/19/15   . Headache    otc med prn  . Knee pain 07/31/2011   Seen orthopedist for this.  Unknown name.  Has tried injections with good results.  Plans on returning.  No current problems as of 03/17/18  . Supervision of other normal pregnancy 04/07/2015    Clinic/provider [facility]) Prenatal Labs  Dating LMP and 21w u/s Blood type: O/POS/-- (02/16 1350)   Genetic Screen declined Antibody:NEG (02/16 1350)  Anatomic [place] HC<3rd %ile at 25w Rubella: 2.16 (02/16 1350)  GTT Early: 125               Third trimester: 3 hour normal RPR: NON REAC (05/26 0909)   Flu vaccine Off season HBsAg: NEGATIVE (02/16 1350)   TDaP vaccine 04/07/15                                              Rhogam: not indicated HIV: NONREACTIVE (05/26 0909)   GBS      Negative                                (For PCN allergy, check sensitivities) GBS:NOT DETECTED (07/01 1440)  Contraception BTL - consent signed 04/07/15; Changed mind and wants Depo Pap: 09/26/12 - negative w/ +HR HPV, needs repeat pap postpartum  Baby Food Bottle   Circumcision N/a   Pediatrician [place] [facility]       . SVD (spontaneous vaginal delivery)    x 3    Past Surgical History:  Procedure Laterality Date  . CARPAL TUNNEL RELEASE Bilateral    2 separate surgeries  . CESAREAN SECTION N/A 06/09/2015   Procedure: CESAREAN SECTION;  Surgeon: Harvey Oman, MD;  Location: [facility];  Service: Obstetrics;  Laterality: N/A;  .  WISDOM TOOTH EXTRACTION      Family History  Problem Relation Age of Onset  . Hypertension Mother   . Hypertension Father   . Diabetes Father   . Hyperlipidemia Father   . Cancer Paternal Grandfather        throat?  . Heart disease Neg Hx   . Stroke Neg Hx     Social History:  reports that she has never smoked. She has never used smokeless tobacco. She reports that she does not drink alcohol or use drugs.  Allergies: No Known Allergies  Medications Prior to Admission  Medication Sig Dispense Refill Last Dose  . acetaminophen (TYLENOL) 500 MG tablet Take 500 mg by mouth every 6 (six) hours as needed.   03/24/2018 at Unknown time  . cyclobenzaprine (FLEXERIL) 5 MG tablet Take 1 tablet (5 mg total) by mouth 2 (two) times daily as needed for muscle  spasms. 30 tablet 1 Past Week at Unknown time  . ibuprofen (ADVIL,MOTRIN) 200 MG tablet Take 400 mg by mouth every 8 (eight) hours as needed (for headaches.).   Past Month at Unknown time  . naproxen (NAPROSYN) 500 MG tablet Take 500 mg by mouth 2 (two) times daily as needed (for pain.).   0 Past Month at Unknown time  . naproxen sodium (ALEVE) 220 MG tablet Take 440 mg by mouth 2 (two) times daily as needed (for headaches.).   Past Month at Unknown time  . Norgestimate-Ethinyl Estradiol Triphasic (ORTHO TRI-CYCLEN, 28,) 0.18/0.215/0.25 MG-35 MCG tablet Take 1 tablet by mouth daily. (Patient taking differently: Take 1 tablet by mouth at bedtime. ) 1 Package 11 03/24/2018 at Unknown time  . diclofenac (CATAFLAM) 50 MG tablet Take 1 tablet (50 mg total) by mouth 3 (three) times daily as needed. (Patient not taking: Reported on 03/10/2018) 90 tablet 1 Not Taking at Unknown time    ROS  She works in a warehouse, does lifting.  Blood pressure (!) 145/96, pulse 88, temperature 98 F (36.7 C), temperature source Oral, resp. rate 16, last menstrual period 03/18/2018, SpO2 100 %. Physical Exam  Heart- rrr Lungs- CTAB Abd- benign  Results for orders  placed or performed during the hospital encounter of 03/25/18 (from the past 24 hour(s))  Pregnancy, urine     Status: None   Collection Time: 03/25/18 10:15 AM  Result Value Ref Range   Preg Test, Ur NEGATIVE NEGATIVE    No results found.  Assessment/Plan: Unwanted fertility- I will plan on doing a laparoscopic application of Filsche clips. She is aware of the permanence of the procedure as well as the failure rate of 1/400.  She understands the risks of surgery, including, but not to infection, bleeding, DVTs, damage to bowel, bladder, ureters. She wishes to proceed.     Emily Filbert 03/25/2018, 11:17 AM

## 2018-03-25 NOTE — Discharge Instructions (Signed)

## 2018-03-26 ENCOUNTER — Encounter (HOSPITAL_COMMUNITY): Payer: Self-pay | Admitting: Obstetrics & Gynecology

## 2018-05-07 ENCOUNTER — Encounter: Payer: Self-pay | Admitting: Obstetrics & Gynecology

## 2018-05-07 ENCOUNTER — Ambulatory Visit (INDEPENDENT_AMBULATORY_CARE_PROVIDER_SITE_OTHER): Payer: Medicaid Other | Admitting: Obstetrics & Gynecology

## 2018-05-07 VITALS — BP 142/88 | HR 84 | Resp 16 | Wt 223.0 lb

## 2018-05-07 DIAGNOSIS — Z9889 Other specified postprocedural states: Secondary | ICD-10-CM

## 2018-05-07 NOTE — Progress Notes (Signed)
   Subjective:    Patient ID: Diane Harvey, female    DOB: 06-Oct-1981, 37 y.o.   MRN: [id number]  HPI 37 yo single lady here for a 6 week post op visit since having a lap BTL. She waited until she started a period to have sex. She noted that her last period was more crampy than usual. Tylenol helped.   Review of Systems Pap was normal 10/17    Objective:   Physical Exam Breathing, conversing, and ambulating normally Well nourished, well hydrated Black female, no apparent distress  Incision healed well      Assessment & Plan:  Postop - doing well Come back 1 year for annual

## 2018-05-16 ENCOUNTER — Ambulatory Visit: Payer: Medicaid Other | Admitting: Family Medicine

## 2018-05-20 ENCOUNTER — Encounter: Payer: Self-pay | Admitting: Family Medicine

## 2018-05-20 ENCOUNTER — Ambulatory Visit (INDEPENDENT_AMBULATORY_CARE_PROVIDER_SITE_OTHER): Payer: Self-pay | Admitting: Family Medicine

## 2018-05-20 ENCOUNTER — Other Ambulatory Visit: Payer: Self-pay

## 2018-05-20 DIAGNOSIS — M545 Low back pain, unspecified: Secondary | ICD-10-CM

## 2018-05-20 DIAGNOSIS — R030 Elevated blood-pressure reading, without diagnosis of hypertension: Secondary | ICD-10-CM

## 2018-05-20 MED ORDER — MELOXICAM 15 MG PO TABS: 15.0000 mg | ORAL_TABLET | Freq: Every day | ORAL | 1 refills | Status: DC

## 2018-05-20 NOTE — Assessment & Plan Note (Signed)
BP not high enough for me to want to initiate antihypertensive right away. Home BP monitoring for now. Reduce salt intake, improve diet and exercise for weight loss. Return in 2-4 weeks with BP readings. Will consider if persistently elevated.

## 2018-05-20 NOTE — Assessment & Plan Note (Signed)
Previously left sided sciatica, now right sided Sciatica. She was referred to PT but her insurance did not cover. Home back exercise instruction given. Naproxen is not as effective. Start Mobic qAM May use Tylenol for breakthrough pain. 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 Return as needed.

## 2018-05-20 NOTE — Patient Instructions (Addendum)
It was nice seeing you today. I am sorry about your back pain. We will switch your pain meds and I will refer you to a spine specialist. Please continue home back exercise per instruction. Check your BP at home daily and return to me in about 2-3 weeks with charting. Also reschedule appointment for repeat PAP.     Back Exercises If you have pain in your back, do these exercises 2-3 times each day or as told by your doctor. When the pain goes away, do the exercises once each day, but repeat the steps more times for each exercise (do more repetitions). If you do not have pain in your back, do these exercises once each day or as told by your doctor. Exercises Single Knee to Chest  Do these steps 3-5 times in a row for each leg: 1. Lie on your back on a firm bed or the floor with your legs stretched out. 2. Bring one knee to your chest. 3. Hold your knee to your chest by grabbing your knee or thigh. 4. Pull on your knee until you feel a gentle stretch in your lower back. 5. Keep doing the stretch for 10-30 seconds. 6. Slowly let go of your leg and straighten it.  Pelvic Tilt  Do these steps 5-10 times in a row: 1. Lie on your back on a firm bed or the floor with your legs stretched out. 2. Bend your knees so they point up to the ceiling. Your feet should be flat on the floor. 3. Tighten your lower belly (abdomen) muscles to press your lower back against the floor. This will make your tailbone point up to the ceiling instead of pointing down to your feet or the floor. 4. Stay in this position for 5-10 seconds while you gently tighten your muscles and breathe evenly.  Cat-Cow  Do these steps until your lower back bends more easily: 1. Get on your hands and knees on a firm surface. Keep your hands under your shoulders, and keep your knees under your hips. You may put padding under your knees. 2. Let your head hang down, and make your tailbone point down to the floor so your lower back is  round like the back of a cat. 3. Stay in this position for 5 seconds. 4. Slowly lift your head and make your tailbone point up to the ceiling so your back hangs low (sags) like the back of a cow. 5. Stay in this position for 5 seconds.  Press-Ups  Do these steps 5-10 times in a row: 1. Lie on your belly (face-down) on the floor. 2. Place your hands near your head, about shoulder-width apart. 3. While you keep your back relaxed and keep your hips on the floor, slowly straighten your arms to raise the top half of your body and lift your shoulders. Do not use your back muscles. To make yourself more comfortable, you may change where you place your hands. 4. Stay in this position for 5 seconds. 5. Slowly return to lying flat on the floor.  Bridges  Do these steps 10 times in a row: 1. Lie on your back on a firm surface. 2. Bend your knees so they point up to the ceiling. Your feet should be flat on the floor. 3. Tighten your butt muscles and lift your butt off of the floor until your waist is almost as high as your knees. If you do not feel the muscles working in your butt and the back  of your thighs, slide your feet 1-2 inches farther away from your butt. 4. Stay in this position for 3-5 seconds. 5. Slowly lower your butt to the floor, and let your butt muscles relax.  If this exercise is too easy, try doing it with your arms crossed over your chest. Belly Crunches  Do these steps 5-10 times in a row: 1. Lie on your back on a firm bed or the floor with your legs stretched out. 2. Bend your knees so they point up to the ceiling. Your feet should be flat on the floor. 3. Cross your arms over your chest. 4. Tip your chin a little bit toward your chest but do not bend your neck. 5. Tighten your belly muscles and slowly raise your chest just enough to lift your shoulder blades a tiny bit off of the floor. 6. Slowly lower your chest and your head to the floor.  Back Lifts Do these steps  5-10 times in a row: 1. Lie on your belly (face-down) with your arms at your sides, and rest your forehead on the floor. 2. Tighten the muscles in your legs and your butt. 3. Slowly lift your chest off of the floor while you keep your hips on the floor. Keep the back of your head in line with the curve in your back. Look at the floor while you do this. 4. Stay in this position for 3-5 seconds. 5. Slowly lower your chest and your face to the floor.  Contact a doctor if:  Your back pain gets a lot worse when you do an exercise.  Your back pain does not lessen 2 hours after you exercise. If you have any of these problems, stop doing the exercises. Do not do them again unless your doctor says it is okay. Get help right away if:  You have sudden, very bad back pain. If this happens, stop doing the exercises. Do not do them again unless your doctor says it is okay. This information is not intended to replace advice given to you by your health care provider. Make sure you discuss any questions you have with your health care provider. Document Released: 12/01/2010 Document Revised: 04/05/2016 Document Reviewed: 12/23/2014 Elsevier Interactive Patient Education  [employer].

## 2018-05-20 NOTE — Progress Notes (Signed)
Subjective:     Patient ID: Diane Harvey, female   DOB: [birth year], 37 y.o.   MRN: [id number]  Hypertension  This is a new problem. The current episode started more than 1 month ago. The problem is unchanged. Pertinent negatives include no chest pain, headaches, peripheral edema or shortness of breath. (Occasional headaches) There are no associated agents to hypertension. Past treatments include nothing. There is no history of kidney disease.  Back Pain  This is a chronic problem. Episode onset: More than 6 months ago. The problem occurs constantly. The problem has been gradually worsening since onset. The pain is present in the lumbar spine. The pain radiates to the right thigh, right knee and right foot. The pain is at a severity of 10/10. The pain is moderate. The symptoms are aggravated by bending, sitting, standing and position. Pertinent negatives include no bladder incontinence, bowel incontinence, chest pain, dysuria, fever, headaches, numbness, paresthesias, tingling or weakness. She has tried NSAIDs (Naproxen) for the symptoms. The treatment provided moderate relief.  Gynecologic Exam  The patient's pertinent negatives include no genital rash, vaginal bleeding or vaginal discharge. Associated symptoms include back pain. Pertinent negatives include no dysuria, fever or headaches. She uses tubal ligation (Tubal ligation two months ago) for contraception. Menstrual history: Since she had the tubal ligation, she has had her period twice a month which is unsual for her. Her past medical history is significant for a gynecological surgery.     Current Outpatient Medications on File Prior to Visit  Medication Sig Dispense Refill  . cyclobenzaprine (FLEXERIL) 5 MG tablet Take 1 tablet (5 mg total) by mouth 2 (two) times daily as needed for muscle spasms. 30 tablet 1  . ibuprofen (ADVIL,MOTRIN) 600 MG tablet Take 1 tablet (600 mg total) by mouth every 6 (six) hours as needed. 30 tablet 1  . naproxen  (NAPROSYN) 500 MG tablet Take 500 mg by mouth 2 (two) times daily with a meal.     No current facility-administered medications on file prior to visit.    Past Medical History:  Diagnosis Date  . Anemia   . Arthritis    lower back  . Back pain   . Bilateral knee pain 06/22/2014   No current problems as of 03/17/18  . Contraception 07/31/2011   Orthotricyclen 07/19/15   . Headache    otc med prn  . Knee pain 07/31/2011   Seen orthopedist for this.  Unknown name.  Has tried injections with good results.  Plans on returning.  No current problems as of 03/17/18  . Supervision of other normal pregnancy 04/07/2015    Clinic/provider [facility]) Prenatal Labs  Dating LMP and 21w u/s Blood type: O/POS/-- (02/16 1350)   Genetic Screen declined Antibody:NEG (02/16 1350)  Anatomic [place] HC<3rd %ile at 25w Rubella: 2.16 (02/16 1350)  GTT Early: 125               Third trimester: 3 hour normal RPR: NON REAC (05/26 0909)   Flu vaccine Off season HBsAg: NEGATIVE (02/16 1350)   TDaP vaccine 04/07/15                                              Rhogam: not indicated HIV: NONREACTIVE (05/26 0909)   GBS      Negative                                (  For PCN allergy, check sensitivities) GBS:NOT DETECTED (07/01 1440)  Contraception BTL - consent signed 04/07/15; Changed mind and wants Depo Pap: 09/26/12 - negative w/ +HR HPV, needs repeat pap postpartum  Baby Food Bottle   Circumcision N/a   Pediatrician [facility]   Support Person FOB       . SVD (spontaneous vaginal delivery)    x 3    Vitals:   05/20/18 0839 05/20/18 0852  BP: 132/90 130/88  Pulse: 70   Temp: 98.2 F (36.8 C)   TempSrc: Oral   SpO2: 99%   Weight: 220 lb (99.8 kg)   Height: 5\' 7"  (1.702 m)     Review of Systems  Constitutional: Negative for fever.  Respiratory: Negative.  Negative for shortness of breath.   Cardiovascular: Negative.  Negative for chest pain.  Gastrointestinal: Negative.  Negative for bowel  incontinence.  Genitourinary: Negative for bladder incontinence, dysuria and vaginal discharge.  Musculoskeletal: Positive for back pain.  Neurological: Negative.  Negative for tingling, weakness, numbness, headaches and paresthesias.  All other systems reviewed and are negative.      Objective:   Physical Exam  Constitutional: She is oriented to person, place, and time. She appears well-developed. No distress.  Cardiovascular: Normal rate, regular rhythm and normal heart sounds.  No murmur heard. Pulmonary/Chest: Effort normal and breath sounds normal. No stridor. No respiratory distress. She has no wheezes.  Abdominal: Soft. Bowel sounds are normal. She exhibits no distension and no mass. There is no tenderness.  Musculoskeletal: She exhibits no edema.       Lumbar back: She exhibits decreased range of motion. She exhibits no tenderness, no bony tenderness, no swelling and no deformity.       Back:  Neurological: She is alert and oriented to person, place, and time. She has normal strength. No cranial nerve deficit or sensory deficit. Coordination normal.  Nursing note and vitals reviewed.      Assessment:     Lumbar pain HTN Gyn exam     Plan:     Check problem list.   For GYN EXAM: PAP result discussed with her. 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 She will reschedule PAP appointment.

## 2018-06-10 ENCOUNTER — Ambulatory Visit (INDEPENDENT_AMBULATORY_CARE_PROVIDER_SITE_OTHER): Payer: Self-pay | Admitting: Orthopaedic Surgery

## 2018-06-16 ENCOUNTER — Other Ambulatory Visit: Payer: Self-pay | Admitting: Family Medicine

## 2018-07-11 ENCOUNTER — Ambulatory Visit (INDEPENDENT_AMBULATORY_CARE_PROVIDER_SITE_OTHER): Payer: Self-pay | Admitting: Family Medicine

## 2018-07-11 ENCOUNTER — Other Ambulatory Visit: Payer: Self-pay

## 2018-07-11 VITALS — BP 128/86 | HR 71 | Temp 98.6°F | Ht 67.0 in | Wt 223.6 lb

## 2018-07-11 DIAGNOSIS — M545 Low back pain, unspecified: Secondary | ICD-10-CM

## 2018-07-11 MED ORDER — MELOXICAM 15 MG PO TABS: 15.0000 mg | ORAL_TABLET | Freq: Every day | ORAL | 0 refills | Status: DC

## 2018-07-11 NOTE — Patient Instructions (Signed)
It was great seeing you today! We have addressed the following issues today  1. I prescribe meloxicam for you an antiinflammatory that you will take ONCE a day for your back pain for the next few days.  Avoid heavy lifting at work. Follow up with your insurance to see if you qualify for injection. 2. Return as needed  If we did any lab work today, and the results require attention, either me or my nurse will get in touch with you. If everything is normal, you will get a letter in mail and a message via . If you don't hear from [place] in two weeks, please give [place] a call. Otherwise, we look forward to seeing you again at your next visit. If you have any questions or concerns before then, please call the clinic at [phone].  Please bring all your medications to every doctors visit  Sign up for My Chart to have easy access to your labs results, and communication with your Primary care physician. Please ask Front Desk for some assistance.   Please check-out at the front desk before leaving the clinic.    Take Care,   Dr. Andy Gauss

## 2018-07-11 NOTE — Progress Notes (Addendum)
Subjective:    Patient ID: Diane Harvey, female    DOB: May 29, 1981, 37 y.o.   MRN: [id number]   CC: Low back pain  HPI: Patient is 37 year old female who presents today complaining of low back pain.  Patient has a history of lumbar pain and has been seen for this problem multiple occasions.  Recently patient had an MRI which shows mild degenerative changes around L4-L5 and L5-S1.  Patient was tried on meloxicam which he tolerated well.  She discussed with PCP doing physical therapy as well as steroid injection but insurance is not providing coverage.  Patient works in a [employer] and does a lot of heavy lifting.  This recent flareup has lasted 48 hours.  She has not tried any medication for it.  She was also previously prescribed Flexeril which she has stopped using because reports that it does not help.  Patient denies any urinary or bowel incontinence.  She reports that the pain is shooting up her back, which is different from her usual sciatica.  No other red flags.  Smoking status reviewed   ROS: all other systems were reviewed and are negative other than in the HPI   Past Medical History:  Diagnosis Date  . Anemia   . Arthritis    lower back  . Back pain   . Bilateral knee pain 06/22/2014   No current problems as of 03/17/18  . Contraception 07/31/2011   Orthotricyclen 07/19/15   . Headache    otc med prn  . Knee pain 07/31/2011   Seen orthopedist for this.  Unknown name.  Has tried injections with good results.  Plans on returning.  No current problems as of 03/17/18  . Supervision of other normal pregnancy 04/07/2015    Clinic/provider [facility]) Prenatal Labs  Dating LMP and 21w u/s Blood type: O/POS/-- (02/16 1350)   Genetic Screen declined Antibody:NEG (02/16 1350)  Anatomic [place] HC<3rd %ile at 25w Rubella: 2.16 (02/16 1350)  GTT Early: 125               Third trimester: 3 hour normal RPR: NON REAC (05/26 0909)   Flu vaccine Off season HBsAg: NEGATIVE (02/16  1350)   TDaP vaccine 04/07/15                                              Rhogam: not indicated HIV: NONREACTIVE (05/26 0909)   GBS      Negative                                (For PCN allergy, check sensitivities) GBS:NOT DETECTED (07/01 1440)  Contraception BTL - consent signed 04/07/15; Changed mind and wants Depo Pap: 09/26/12 - negative w/ +HR HPV, needs repeat pap postpartum  Baby Food Bottle   Circumcision N/a   Pediatrician [place]       . SVD (spontaneous vaginal delivery)    x 3    Past Surgical History:  Procedure Laterality Date  . CARPAL TUNNEL RELEASE Bilateral    2 separate surgeries  . CESAREAN SECTION N/A 06/09/2015   Procedure: CESAREAN SECTION;  Surgeon: Osborne Oman, MD;  Location: [facility];  Service: Obstetrics;  Laterality: N/A;  . LAPAROSCOPIC TUBAL LIGATION Bilateral 03/25/2018  Procedure: LAPAROSCOPIC TUBAL LIGATION;  Surgeon: Emily Filbert, MD;  Location: [facility];  Service: Gynecology;  Laterality: Bilateral;  . WISDOM TOOTH EXTRACTION      Past medical history, surgical, family, and social history reviewed and updated in the EMR as appropriate.  Objective:  BP 128/86   Pulse 71   Temp 98.6 F (37 C) (Oral)   Ht 5\' 7"  (1.702 m)   Wt 223 lb 9.6 oz (101.4 kg)   LMP 06/18/2018 (Exact Date)   SpO2 99%   BMI 35.02 kg/m   Vitals and nursing note reviewed  General: NAD, pleasant, able to participate in exam Cardiac: RRR, normal heart sounds, no murmurs. 2+ radial and PT pulses bilaterally Respiratory: CTAB, normal effort, No wheezes, rales or rhonchi Abdomen: soft, nontender, nondistended, no hepatic or splenomegaly, +BS Low back exam: No lumbar lordosis, thoracic, kyphosis, scoliosis, pelvic assymmetry/tilt.  Mild TTP paraspinous muscles, limited lumbar flexion/extension, strength hip (adduction, flexion), normal straight leg raise, FABER, patellar and achilles reflex intact.  Extremities: no edema or cyanosis. WWP. Skin: warm and  dry, no rashes noted Neuro: alert and oriented x4, no focal deficits Psych: Normal affect and mood   Assessment & Plan:   Lumbar pain Patient presented with chronic back pain most recent flareup started 2 days ago.  Imaging showed evidence of mild degenerative changes in her lumbar and sacrum area with mild disc herniation.  Referred to physical therapy as well as possible steroid injection.  However patient does not qualify due to insurance.  She reports no improvement in his symptoms with Flexeril.  Tolerated meloxicam in the past well.  Flareups likely secondary to occupation.  Heavy lifting of the daily basis.  Exam is consistent with chronic lumbar pain. --We will prescribe meloxicam 15 mg daily for 10 days --Restricted from heavy lifting for the next few days --Follow-up with insurance to see if patient qualifies for injection intrathecal --Follow-up on Ortho referral    Marjie Skiff, MD [place] PGY-3

## 2018-07-11 NOTE — Assessment & Plan Note (Signed)
Patient presented with chronic back pain most recent flareup started 2 days ago.  Imaging showed evidence of mild degenerative changes in her lumbar and sacrum area with mild disc herniation.  Referred to physical therapy as well as possible steroid injection.  However patient does not qualify due to insurance.  She reports no improvement in his symptoms with Flexeril.  Tolerated meloxicam in the past well.  Flareups likely secondary to occupation.  Heavy lifting of the daily basis.  Exam is consistent with chronic lumbar pain. --We will prescribe meloxicam 15 mg daily for 10 days --Restricted from heavy lifting for the next few days --Follow-up with insurance to see if patient qualifies for injection intrathecal --Follow-up on Ortho referral

## 2018-08-12 ENCOUNTER — Ambulatory Visit (INDEPENDENT_AMBULATORY_CARE_PROVIDER_SITE_OTHER): Payer: Self-pay | Admitting: Family Medicine

## 2018-08-12 ENCOUNTER — Other Ambulatory Visit: Payer: Self-pay

## 2018-08-12 DIAGNOSIS — M545 Low back pain, unspecified: Secondary | ICD-10-CM

## 2018-08-12 MED ORDER — PREDNISONE 50 MG PO TABS: ORAL_TABLET | ORAL | 0 refills | Status: DC

## 2018-08-12 NOTE — Progress Notes (Signed)
   CC: back pain  HPI  Back pain - 10/10 today, no dysuria. Has not heard of orange card, [place] medicaid will not cover ortho. Yesterday had trouble getting out of bed. Pain flares up. She is on FMLA at work already. Works in a [employer]. Thinks she needs to go part time. In lumbar region. No fever, no changes in BMs. Took flexeril and mobic, last dose this am. Feels these dont help at all. Sometimes heating pad has helped. Has tried bengay, never tried patches.   ROS: Denies CP, SOB, abdominal pain, dysuria, changes in BMs.   CC, SH/smoking status, and VS noted  Objective: BP 128/86   Pulse 66   Temp 98.3 F (36.8 C) (Oral)   Ht 5\' 7"  (1.702 m)   Wt 226 lb (102.5 kg)   LMP 08/07/2018 (Exact Date)   SpO2 99%   BMI 35.40 kg/m  Gen: NAD, alert, cooperative, and pleasant. HEENT: NCAT, EOMI, PERRL CV: RRR, no murmur Resp: CTAB, no wheezes, non-labored Abd: SNTND, BS present, no guarding or organomegaly MSK: TTP over lumbar paraspinal muscles, no spinous process tenderness.  Ext: No edema, warm Neuro: Alert and oriented, Speech clear, No gross deficits  Assessment and plan:  Lumbar pain Already on treatment with muscle relaxers and NSAIDs. Asked her to take NSAIDs with food. Can also use APAP. 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 Encouraged her to apply for orange card to get to ortho to discuss MRI findings.    No orders of the defined types were placed in this encounter.   Meds ordered this encounter  Medications  . predniSONE (DELTASONE) 50 MG tablet    Sig: Take once daily with food.    Dispense:  5 tablet    Refill:  0     Ralene Ok, MD, PGY3 08/13/2018 1:40 PM

## 2018-08-12 NOTE — Patient Instructions (Addendum)
It was a pleasure to see you today! Thank you for choosing [facility] for your primary care. Diane Harvey was seen for back pain.   Our plans for today were:  Try the steroid medicine for a few days, take this with food.   Please ask [place] for the orange card papers up front.    Best,  Dr. Lindell Noe   Back Pain, Adult Back pain is very common. The pain often gets better over time. The cause of back pain is usually not dangerous. Most people can learn to manage their back pain on their own. Follow these instructions at home: Watch your back pain for any changes. The following actions may help to lessen any pain you are feeling:  Stay active. Start with short walks on flat ground if you can. Try to walk farther each day.  Exercise regularly as told by your doctor. Exercise helps your back heal faster. It also helps avoid future injury by keeping your muscles strong and flexible.  Do not sit, drive, or stand in one place for more than 30 minutes.  Do not stay in bed. Resting more than 1-2 days can slow down your recovery.  Be careful when you bend or lift an object. Use good form when lifting: ? Bend at your knees. ? Keep the object close to your body. ? Do not twist.  Sleep on a firm mattress. Lie on your side, and bend your knees. If you lie on your back, put a pillow under your knees.  Take medicines only as told by your doctor.  Put ice on the injured area. ? Put ice in a plastic bag. ? Place a towel between your skin and the bag. ? Leave the ice on for 20 minutes, 2-3 times a day for the first 2-3 days. After that, you can switch between ice and heat packs.  Avoid feeling anxious or stressed. Find good ways to deal with stress, such as exercise.  Maintain a healthy weight. Extra weight puts stress on your back.  Contact a doctor if:  You have pain that does not go away with rest or medicine.  You have worsening pain that goes down into your legs or  buttocks.  You have pain that does not get better in one week.  You have pain at night.  You lose weight.  You have a fever or chills. Get help right away if:  You cannot control when you poop (bowel movement) or pee (urinate).  Your arms or legs feel weak.  Your arms or legs lose feeling (numbness).  You feel sick to your stomach (nauseous) or throw up (vomit).  You have belly (abdominal) pain.  You feel like you may pass out (faint). This information is not intended to replace advice given to you by your health care provider. Make sure you discuss any questions you have with your health care provider. Document Released: 04/16/2008 Document Revised: 04/05/2016 Document Reviewed: 03/02/2014 Elsevier Interactive Patient Education  [employer].

## 2018-08-13 NOTE — Assessment & Plan Note (Signed)
Already on treatment with muscle relaxers and NSAIDs. Asked her to take NSAIDs with food. Can also use APAP. 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 Encouraged her to apply for orange card to get to ortho to discuss MRI findings.

## 2019-03-13 ENCOUNTER — Ambulatory Visit: Payer: Medicaid Other | Admitting: Family Medicine

## 2019-03-17 ENCOUNTER — Telehealth: Payer: Medicaid Other | Admitting: Physician Assistant

## 2019-03-17 DIAGNOSIS — M549 Dorsalgia, unspecified: Secondary | ICD-10-CM

## 2019-03-17 MED ORDER — NAPROXEN 500 MG PO TABS: 500.0000 mg | ORAL_TABLET | Freq: Two times a day (BID) | ORAL | 0 refills | Status: DC

## 2019-03-17 MED ORDER — CYCLOBENZAPRINE HCL 10 MG PO TABS: 5.0000 mg | ORAL_TABLET | Freq: Three times a day (TID) | ORAL | 0 refills | Status: DC | PRN

## 2019-03-17 NOTE — Progress Notes (Signed)
We are sorry that you are not feeling well.  Here is how we plan to help!  Based on what you have shared with me it looks like you mostly have acute back pain.  Acute back pain is defined as musculoskeletal pain that can resolve in 1-3 weeks with conservative treatment.  I have prescribed Naprosyn 500 mg twice a day non-steroid anti-inflammatory (NSAID) as well as Flexeril 10 mg every eight hours as needed which is a muscle relaxer  Some patients experience stomach irritation or in increased heartburn with anti-inflammatory drugs.  Please keep in mind that muscle relaxer's can cause fatigue and should not be taken while at work or driving.  Back pain is very common.  The pain often gets better over time.  The cause of back pain is usually not dangerous.  Most people can learn to manage their back pain on their own.  Home Care Stay active.  Start with short walks on flat ground if you can.  Try to walk farther each day. Do not sit, drive or stand in one place for more than 30 minutes.  Do not stay in bed. Do not avoid exercise or work.  Activity can help your back heal faster. Be careful when you bend or lift an object.  Bend at your knees, keep the object close to you, and do not twist. Sleep on a firm mattress.  Lie on your side, and bend your knees.  If you lie on your back, put a pillow under your knees. Only take medicines as told by your doctor. Put ice on the injured area. Put ice in a plastic bag Place a towel between your skin and the bag Leave the ice on for 15-20 minutes, 3-4 times a day for the first 2-3 days. 210 After that, you can switch between ice and heat packs. Ask your doctor about back exercises or massage. Avoid feeling anxious or stressed.  Find good ways to deal with stress, such as exercise.  Get Help Right Way If: Your pain does not go away with rest or medicine. Your pain does not go away in 1 week. You have new problems. You do not feel well. The pain spreads  into your legs. You cannot control when you poop (bowel movement) or pee (urinate) You feel sick to your stomach (nauseous) or throw up (vomit) You have belly (abdominal) pain. You feel like you may pass out (faint). If you develop a fever.  Make Sure you: Understand these instructions. Will watch your condition Will get help right away if you are not doing well or get worse.  Your e-visit answers were reviewed by a board certified advanced clinical practitioner to complete your personal care plan.  Depending on the condition, your plan could have included both over the counter or prescription medications.  If there is a problem please reply once you have received a response from your provider.  Your safety is important to [place].  If you have drug allergies check your prescription carefully.    You can use MyChart to ask questions about today's visit, request a non-urgent call back, or ask for a work or school excuse for 24 hours related to this e-Visit. If it has been greater than 24 hours you will need to follow up with your provider, or enter a new e-Visit to address those concerns.  You will get an e-mail in the next two days asking about your experience.  I hope that your e-visit has been valuable  and will speed your recovery. Thank you for using e-visits.   ===View-only below this line===   ----- Message -----    From: Diane Harvey    Sent: 03/17/2019  1:15 PM EDT      To: E-Visit Mailing List Subject: E-Visit Submission: Back Pain  E-Visit Submission: Back Pain --------------------------------  Question: Where are you having pain Answer:   Lower back  Question: Does the pain extend into your legs? Answer:   No  Question: Are you having any numbness or weakness of the legs? Answer:   No  Question: Does this pain radiate to the abdomen or groin? Answer:   No  Question: How bad is the pain? Answer:   The pain is severe  Question: Did you have an injury that caused  the pain? Answer:   No, I cannot remember an injury  Question: How long has the pain been present? Answer:   More than 2 days but less than 1 week  Question: Have you had back pain in the past? Answer:   Yes, I have many times had pain similiar to this before  Question: Do you have a history of fever associated with your back pain? Answer:   No  Question: Please list any medications you have previously taken for back pain. Answer:   none  Question: Do you have a fever? Answer:   I do not know  Question: Do you have any of the following? Answer:   None of the above  Question: What makes the pain worse? Answer:   Any movement  Question: What makes the pain better Answer:   None of the above  Question: Do you get relief with certain positions (even if only partial relief)? Answer:   No  Question: Have you ever been diagnosed with cancer? Answer:   No  Question: Have you ever been diagnosed with arthritis? Answer:   Yes  Question: Have you ever been diagnosed with osteoporosis or any other bone weakness? Answer:   No  Question: Have you ever had surgery on your back or spine? Answer:   No  Question: Do you have a history of Intravenous Drug Use? Answer:   No  Question: What is your usual health status? Answer:   I am active and can move normally  Question: Are you pregnant? Answer:   I am confident that I am not pregnant  Question: Are you breastfeeding? Answer:   No  Question: Please list your medication allergies that you may have ? (If 'none' , please list as 'none') Answer:   none  Question: Please list any additional comments  Answer:   i am also having problems in my left hand due to my carpal tunnel,i cannot move my hand with lots of pain in wrist area.  A total of 5-10 minutes was spent evaluating this patients questionnaire and formulating a plan of care.

## 2019-07-31 ENCOUNTER — Other Ambulatory Visit: Payer: Self-pay

## 2019-07-31 ENCOUNTER — Encounter: Payer: Self-pay | Admitting: Family Medicine

## 2019-07-31 ENCOUNTER — Ambulatory Visit (INDEPENDENT_AMBULATORY_CARE_PROVIDER_SITE_OTHER): Payer: Medicaid Other | Admitting: Family Medicine

## 2019-07-31 VITALS — BP 124/70 | HR 73 | Ht 67.0 in | Wt 234.0 lb

## 2019-07-31 DIAGNOSIS — E669 Obesity, unspecified: Secondary | ICD-10-CM

## 2019-07-31 DIAGNOSIS — N926 Irregular menstruation, unspecified: Secondary | ICD-10-CM | POA: Insufficient documentation

## 2019-07-31 DIAGNOSIS — R102 Pelvic and perineal pain: Secondary | ICD-10-CM

## 2019-07-31 NOTE — Progress Notes (Signed)
Subjective:     Patient ID: Diane Harvey, female   DOB: [birth year], 38 y.o.   MRN: [id number]  HPI Incision site pain: C/O pain on and off from the C/Section incision site. She feels a knot there as well. The pain occurs twice a week, and it started more than 3 months ago. She describes her pain as sharp, located on her left lower abd quadrant. The most recent episode has been for two days. Obesity: Diet control and exercise plan. Retail work, and she walks around 4 hrs daily. Menstrual Issue: C/O having her periods twice a month since she had a tubal ligation. The most recent episodes started about three months ago, but she feels this has been ongoing for more than a year. The first period of the month is usually light. However, the second is often heavy. There is an associated menstrual cramp, which is usual for her. She denies blood clots. LMP: 07/29/19.  Current Outpatient Medications on File Prior to Visit  Medication Sig Dispense Refill   cyclobenzaprine (FLEXERIL) 10 MG tablet Take 0.5-1 tablets (5-10 mg total) by mouth 3 (three) times daily as needed for muscle spasms. 30 tablet 0   meloxicam (MOBIC) 15 MG tablet Take 1 tablet (15 mg total) by mouth daily. 10 tablet 0   No current facility-administered medications on file prior to visit.    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  Unknown name.  Has tried injections with good results.  Plans on returning.  No current problems as of 03/17/18   Supervision of other normal pregnancy 04/07/2015    Clinic/provider [facility]) Prenatal Labs  Dating LMP and 21w u/s Blood type: O/POS/-- (02/16 1350)   Genetic Screen declined Antibody:NEG (02/16 1350)  Anatomic [place]  HC<3rd %ile at 25w Rubella: 2.16 (02/16 1350)  GTT Early: 125               Third trimester: 3 hour normal RPR: NON REAC (05/26 0909)   Flu vaccine Off season HBsAg: NEGATIVE (02/16 1350)   TDaP vaccine 04/07/15                                              Rhogam: not indicated HIV: NONREACTIVE (05/26 0909)   GBS      Negative                                (For PCN allergy, check sensitivities) GBS:NOT DETECTED (07/01 1440)  Contraception BTL - consent signed 04/07/15; Changed mind and wants Depo Pap: 09/26/12 - negative w/ +HR HPV, needs repeat pap postpartum  Baby Food Bottle   Circumcision N/a   Pediatrician [facility]   [facility]        SVD (spontaneous vaginal delivery)    x 3   Vitals:   07/31/19 0908  BP: 124/70  Pulse: 73  SpO2: 100%  Weight: 234 lb (106.1 kg)  Height: 5\' 7"  (  1.702 m)     Review of Systems  Respiratory: Negative.   Cardiovascular: Negative.   Gastrointestinal: Negative.  Negative for abdominal distention, diarrhea, nausea and vomiting.       Left LQ or pelvic pain  Genitourinary: Negative.   Musculoskeletal: Negative.   All other systems reviewed and are negative.      Objective:   Physical Exam Vitals signs and nursing note reviewed.  Constitutional:      Appearance: She is not ill-appearing.  Cardiovascular:     Rate and Rhythm: Normal rate and regular rhythm.     Heart sounds: Normal heart sounds. No murmur.  Pulmonary:     Effort: Pulmonary effort is normal. No respiratory distress.     Breath sounds: Normal breath sounds. No wheezing or rhonchi.  Abdominal:     General: Abdomen is flat. Bowel sounds are normal.     Palpations: Abdomen is soft.     Tenderness: There is abdominal tenderness.    Neurological:     Mental Status: She is alert.        Assessment:     Pelvic pain Obesity Irregular menses.    Plan:     Check problem list.

## 2019-07-31 NOTE — Assessment & Plan Note (Addendum)
Counseled on diet and exercise changes. Labs checked including FLP. Monitor closely.

## 2019-07-31 NOTE — Assessment & Plan Note (Signed)
Upreg not needed since she got tubal ligation. ++ Nodular knot. [place] recommended and was ordered. She will contact [place] center to schedule. Tylenol as needed for pain.

## 2019-07-31 NOTE — Patient Instructions (Addendum)
It was nice seeing you today. Let us get pelvic ultrasound for the pain and knot in your belly. We will get some lab work done for your irregular period.  I will also give information about weight loss.  See me back in 2 weeks for PAP and f/u.   Obesity, Adult Obesity is having too much body fat. Being obese means that your weight is more than what is healthy for you. BMI is a number that explains how much body fat you have. If you have a BMI of 30 or more, you are obese. Obesity is often caused by eating or drinking more calories than your body uses. Changing your lifestyle can help you lose weight. Obesity can cause serious health problems, such as:  Stroke.  Coronary artery disease (CAD).  Type 2 diabetes.  Some types of cancer, including cancers of the colon, breast, uterus, and gallbladder.  Osteoarthritis.  High blood pressure (hypertension).  High cholesterol.  Sleep apnea.  Gallbladder stones.  Infertility problems. What are the causes?  Eating meals each day that are high in calories, sugar, and fat.  Being born with genes that may make you more likely to become obese.  Having a medical condition that causes obesity.  Taking certain medicines.  Sitting a lot (having a sedentary lifestyle).  Not getting enough sleep.  Drinking a lot of drinks that have sugar in them. What increases the risk?  Having a family history of obesity.  Being an [place] American woman.  Being a Hispanic man.  Living in an area with limited access to: ? Romilda Garret, recreation centers, or sidewalks. ? Healthy food choices, such as grocery stores and farmers' markets. What are the signs or symptoms? The main sign is having too much body fat. How is this treated?  Treatment for this condition often includes changing your lifestyle. Treatment may include: ? Changing your diet. This may include making a healthy meal plan. ? Exercise. This may include activity that causes your heart  to beat faster (aerobic exercise) and strength training. Work with your doctor to design a program that works for you. ? Medicine to help you lose weight. This may be used if you are not able to lose 1 pound a week after 6 weeks of healthy eating and more exercise. ? Treating conditions that cause the obesity. ? Surgery. Options may include gastric banding and gastric bypass. This may be done if:  Other treatments have not helped to improve your condition.  You have a BMI of 40 or higher.  You have life-threatening health problems related to obesity. Follow these instructions at home: Eating and drinking   Follow advice from your doctor about what to eat and drink. Your doctor may tell you to: ? Limit fast food, sweets, and processed snack foods. ? Choose low-fat options. For example, choose low-fat milk instead of whole milk. ? Eat 5 or more servings of fruits or vegetables each day. ? Eat at home more often. This gives you more control over what you eat. ? Choose healthy foods when you eat out. ? Learn to read food labels. This will help you learn how much food is in 1 serving. ? Keep low-fat snacks available. ? Avoid drinks that have a lot of sugar in them. These include soda, fruit juice, iced tea with sugar, and flavored milk.  Drink enough water to keep your pee (urine) pale yellow.  Do not go on fad diets. Physical activity  Exercise often, as told by  your doctor. Most adults should get up to 150 minutes of moderate-intensity exercise every week.Ask your doctor: ? What types of exercise are safe for you. ? How often you should exercise.  Warm up and stretch before being active.  Do slow stretching after being active (cool down).  Rest between times of being active. Lifestyle  Work with your doctor and a food expert (dietitian) to set a weight-loss goal that is best for you.  Limit your screen time.  Find ways to reward yourself that do not involve food.  Do not  drink alcohol if: ? Your doctor tells you not to drink. ? You are pregnant, may be pregnant, or are planning to become pregnant.  If you drink alcohol: ? Limit how much you use to:  0-1 drink a day for women.  0-2 drinks a day for men. ? Be aware of how much alcohol is in your drink. In the [place] one drink equals one 12 oz bottle of beer (355 mL), one 5 oz glass of wine (148 mL), or one 1 oz glass of hard liquor (44 mL). General instructions  Keep a weight-loss journal. This can help you keep track of: ? The food that you eat. ? How much exercise you get.  Take over-the-counter and prescription medicines only as told by your doctor.  Take vitamins and supplements only as told by your doctor.  Think about joining a support group.  Keep all follow-up visits as told by your doctor. This is important. Contact a doctor if:  You cannot meet your weight loss goal after you have changed your diet and lifestyle for 6 weeks. Get help right away if you:  Are having trouble breathing.  Are having thoughts of harming yourself. Summary  Obesity is having too much body fat.  Being obese means that your weight is more than what is healthy for you.  Work with your doctor to set a weight-loss goal.  Get regular exercise as told by your doctor. This information is not intended to replace advice given to you by your health care provider. Make sure you discuss any questions you have with your health care provider. Document Released: 01/21/2012 Document Revised: 07/03/2018 Document Reviewed: 07/03/2018 Elsevier Patient Education  2020 [employer].

## 2019-07-31 NOTE — Assessment & Plan Note (Signed)
??   Postmenopausal syndrome. R/O endocrine disorders. TSH checked. Check CBC as well. Monitor closely.

## 2019-08-01 LAB — BASIC METABOLIC PANEL
BUN/Creatinine Ratio: 9 (ref 9–23)
BUN: 7 mg/dL (ref 6–24)
CO2: 27 mmol/L (ref 20–29)
Calcium: 9.2 mg/dL (ref 8.7–10.2)
Chloride: 101 mmol/L (ref 96–106)
Creatinine, Ser: 0.77 mg/dL (ref 0.57–1.00)
GFR calc Af Amer: 112 mL/min/{1.73_m2} (ref >59)
GFR calc non Af Amer: 97 mL/min/{1.73_m2} (ref >59)
Glucose: 95 mg/dL (ref 65–99)
Potassium: 3.6 mmol/L (ref 3.5–5.2)
Sodium: 139 mmol/L (ref 134–144)

## 2019-08-01 LAB — CBC WITH DIFFERENTIAL/PLATELET
Basophils Absolute: 0.1 10*3/uL (ref 0.0–0.2)
Basos: 1 %
EOS (ABSOLUTE): 0.2 10*3/uL (ref 0.0–0.4)
Eos: 3 %
Hematocrit: 39.3 % (ref 34.0–46.6)
Hemoglobin: 12.5 g/dL (ref 11.1–15.9)
Immature Grans (Abs): 0 10*3/uL (ref 0.0–0.1)
Immature Granulocytes: 0 %
Lymphocytes Absolute: 1.7 10*3/uL (ref 0.7–3.1)
Lymphs: 26 %
MCH: 25.8 pg — ABNORMAL LOW (ref 26.6–33.0)
MCHC: 31.8 g/dL (ref 31.5–35.7)
MCV: 81 fL (ref 79–97)
Monocytes Absolute: 0.5 10*3/uL (ref 0.1–0.9)
Monocytes: 8 %
Neutrophils Absolute: 4.1 10*3/uL (ref 1.4–7.0)
Neutrophils: 62 %
Platelets: 342 10*3/uL (ref 150–450)
RBC: 4.84 x10E6/uL (ref 3.77–5.28)
RDW: 14.8 % (ref 11.7–15.4)
WBC: 6.5 10*3/uL (ref 3.4–10.8)

## 2019-08-01 LAB — LIPID PANEL
Chol/HDL Ratio: 2.6 ratio (ref 0.0–4.4)
Cholesterol, Total: 176 mg/dL (ref 100–199)
HDL: 67 mg/dL (ref >39)
LDL Chol Calc (NIH): 96 mg/dL (ref 0–99)
Triglycerides: 69 mg/dL (ref 0–149)
VLDL Cholesterol Cal: 13 mg/dL (ref 5–40)

## 2019-08-01 LAB — FSH/LH
FSH: 7.3 m[IU]/mL
LH: 6.8 m[IU]/mL

## 2019-08-01 LAB — TSH: TSH: 1.97 u[IU]/mL (ref 0.450–4.500)

## 2019-08-10 ENCOUNTER — Ambulatory Visit
Admission: RE | Admit: 2019-08-10 | Discharge: 2019-08-10 | Disposition: A | Discharge status: Home / Self Care | Payer: Medicaid Other | Source: Ambulatory Visit | Attending: Family Medicine | Admitting: Family Medicine

## 2019-08-10 DIAGNOSIS — R102 Pelvic and perineal pain: Secondary | ICD-10-CM

## 2019-08-10 DIAGNOSIS — N926 Irregular menstruation, unspecified: Secondary | ICD-10-CM

## 2019-08-11 ENCOUNTER — Telehealth: Payer: Self-pay | Admitting: Family Medicine

## 2019-08-11 NOTE — Telephone Encounter (Signed)
HIPAA compliant callback message left.  I also sent her a result release message.  [place] looks fine. Endometrial thickness of 86mm could be normal for her depending on her menstrual phase. She also does not endorse menstrual irregularity.

## 2019-08-11 NOTE — Telephone Encounter (Signed)
Result discussed with her. Endometrial thickness of 5 mm could be normal. However, she is concerned about her intermenstrual bleed and hx of breast cancer in her mother. Endometrial biopsy discussed and she will like to proceed with that. Gyn clinic appointment made.

## 2019-08-11 NOTE — Telephone Encounter (Signed)
Pt is returning Dr. Macario Golds phone call. Pt would like for someone to call to discuss results.

## 2019-08-11 NOTE — Telephone Encounter (Signed)
Will forward to MD. Atharva Mirsky,CMA  

## 2019-09-07 NOTE — Progress Notes (Deleted)
   Subjective:    Patient ID: Diane Harvey, female    DOB: August 18, 1981, 38 y.o.   MRN: [id number]   CC: endometrial bx  HPI: Endometrial bx Patient seen by PCP on 9/18 at which time she was complaining of intermenstrual bleeding.  Ultrasound was ordered showing an endometrial thickness of 5 mm.  Family history significant for breast cancer in her mother.  Smoking status reviewed  Review of Systems   Objective:  There were no vitals taken for this visit. Vitals and nursing note reviewed  General: well nourished, in no acute distress HEENT: normocephalic, TM's visualized bilaterally, no scleral icterus or conjunctival pallor, no nasal discharge, moist mucous membranes, good dentition without erythema or discharge noted in posterior oropharynx Neck: supple, non-tender, without lymphadenopathy Cardiac: RRR, clear S1 and S2, no murmurs, rubs, or gallops Respiratory: clear to auscultation bilaterally, no increased work of breathing Abdomen: soft, nontender, nondistended, no masses or organomegaly. Bowel sounds present Extremities: no edema or cyanosis. Warm, well perfused. 2+ radial and PT pulses bilaterally Skin: warm and dry, no rashes noted Neuro: alert and oriented, no focal deficits   Assessment & Plan:    No problem-specific Assessment & Plan notes found for this encounter.    No follow-ups on file.   Caroline More, DO, PGY-3

## 2019-09-10 ENCOUNTER — Ambulatory Visit: Payer: Medicaid Other

## 2019-09-23 ENCOUNTER — Telehealth: Payer: Self-pay

## 2019-09-23 ENCOUNTER — Ambulatory Visit (INDEPENDENT_AMBULATORY_CARE_PROVIDER_SITE_OTHER): Payer: Self-pay | Admitting: Family Medicine

## 2019-09-23 ENCOUNTER — Other Ambulatory Visit: Payer: Self-pay

## 2019-09-23 ENCOUNTER — Encounter: Payer: Self-pay | Admitting: Family Medicine

## 2019-09-23 VITALS — BP 108/80 | HR 75 | Wt 227.8 lb

## 2019-09-23 DIAGNOSIS — L700 Acne vulgaris: Secondary | ICD-10-CM | POA: Insufficient documentation

## 2019-09-23 LAB — POCT URINE PREGNANCY: Preg Test, Ur: NEGATIVE

## 2019-09-23 MED ORDER — TRETINOIN 0.05 % EX GEL: 1.0000 "application " | Freq: Every day | CUTANEOUS | 2 refills | Status: AC

## 2019-09-23 MED ORDER — DOXYCYCLINE HYCLATE 100 MG PO TABS: 100.0000 mg | ORAL_TABLET | Freq: Two times a day (BID) | ORAL | 0 refills | Status: DC

## 2019-09-23 NOTE — Assessment & Plan Note (Signed)
Patient with multiple diffuse pustules on the chin with a carbuncle. - Topical tertinoin 0.05% gel daily at bedtime after washing face. - Doxycycline for the carbuncle. I did consider doing an I&D but no drainage and no fluctuance plus it was located on the face was I was concerned about it leaving a scar. Consider if it does not improve on antibiotics.

## 2019-09-23 NOTE — Progress Notes (Signed)
     Subjective: Chief Complaint  Patient presents with  . Bumps on Face    HPI: Diane Harvey is a 38 y.o. presenting to clinic today to discuss the following:  Carbuncle Patient with >2cm red swollen, non-fluctuant, no draining papule on the lower right side of her chin. Area has developed over the last several days. No surrounding redness, no heat, no pain. She has never had this before on her face.  Acne Patient started developing pustular acne on her chin after face masks were required. Since then her acne has increased and gotten worse despite washing her face and using soaps. She requests treatment today. No fever, chills, pain or tenderness on the chin, nausea, vomiting, abdominal pain, diarrhea, or constipation.     ROS noted in HPI.    Social History   Tobacco Use  Smoking Status Never Smoker  Smokeless Tobacco Never Used   Objective: BP 108/80   Pulse 75   Wt 227 lb 12.8 oz (103.3 kg)   LMP 09/13/2019 (Approximate)   SpO2 100%   BMI 35.68 kg/m  Vitals and nursing notes reviewed  Physical Exam Gen: Alert and Oriented x 3, NAD HEENT: Normocephalic, atraumatic Ext: no clubbing, cyanosis, or edema Skin: warm, dry, intact, >2cm erythematous papule with no surrounding erythema or fluctuance on the lower right side of chin. Pustular acne dispered across the chin  Results for orders placed or performed in visit on 09/23/19 (from the past 72 hour(s))  POCT urine pregnancy     Status: None   Collection Time: 09/23/19 10:26 AM  Result Value Ref Range   Preg Test, Ur Negative Negative    Assessment/Plan:  Acne vulgaris Patient with multiple diffuse pustules on the chin with a carbuncle. - Topical tertinoin 0.05% gel daily at bedtime after washing face. - Doxycycline for the carbuncle. I did consider doing an I&D but no drainage and no fluctuance plus it was located on the face was I was concerned about it leaving a scar. Consider if it does not improve on  antibiotics.   PATIENT EDUCATION PROVIDED: See AVS    Diagnosis and plan along with any newly prescribed medication(s) were discussed in detail with this patient today. The patient verbalized understanding and agreed with the plan. Patient advised if symptoms worsen return to clinic or ER.    Orders Placed This Encounter  Procedures  . POCT urine pregnancy    Meds ordered this encounter  Medications  . tretinoin (ALTRALIN) 0.05 % gel    Sig: Apply 1 application topically at bedtime.    Dispense:  45 g    Refill:  2  . doxycycline (VIBRA-TABS) 100 MG tablet    Sig: Take 1 tablet (100 mg total) by mouth 2 (two) times daily.    Dispense:  20 tablet    Refill:  0    Harolyn Rutherford, DO 09/23/2019, 9:55 AM PGY-3 [place]

## 2019-09-23 NOTE — Telephone Encounter (Signed)
Received fax from pharmacy stating medicaid does not cover Tretinoin Gel. Please see below for alternatives.

## 2019-09-23 NOTE — Patient Instructions (Signed)
Acne  Acne is a skin problem that causes small, red bumps (pimples) and other skin changes. The skin has tiny holes called pores. Each pore has an oil gland. Acne happens when the pores get blocked. The pores may become red, sore, and swollen. They may also become infected. Acne is common among teenagers. Acne usually goes away over time. What are the causes? This condition may be caused when:  Oil glands get blocked by oil, dead skin cells, and dirt.  Bacteria that live in the oil glands increase in number and cause infection. Acne can start with changes in hormones. These changes can occur:  When children mature into their teens (adolescence).  When women get their period (menstrual cycle).  When women are pregnant. Some things can make acne worse. They include:  Cosmetics and hair products that have oil in them.  Stress.  Diseases that cause changes in hormones.  Some medicines.  Headbands, backpacks, or shoulder pads.  Being near certain oils and chemicals.  Foods that are high in sugars. These include dairy products, sweets, and chocolates. What increases the risk? You are more likely to develop this condition if:  You are a teenager.  You have a family history of acne. What are the signs or symptoms? Symptoms of this condition include:  Small, red bumps (pimples or papules).  Whiteheads.  Blackheads.  Small, pus-filled pimples (pustules).  Big, red pimples or pustules that feel tender. Acne that is very bad can cause:  An abscess. This is an area that has pus.  Cysts. These are hard, painful sacs that have fluid.  Scars. These can happen after large pimples heal. How is this treated? Treatment for this condition depends on how bad your acne is. It may include:  Creams and lotions. These can: ? Keep the pores of your skin open. ? Prevent infections and swelling.  Medicines that treat infections (antibiotics). These can be put on your skin or taken  as pills.  Pills that decrease the amount of oil in your skin.  Birth control pills.  Light or laser treatments.  Shots of medicine into the areas with acne.  Chemicals that cause the skin to peel.  Surgery. Follow these instructions at home: Good skin care is the most important thing you can do to treat your acne. Take care of your skin as told by your doctor. You may be told to do these things:  Wash your skin gently at least two times each day. You should also wash your skin: ? After you exercise. ? Before you go to bed.  Use mild soap.  Use a water-based skin moisturizer after you wash your skin.  Use a sunscreen or sunblock with SPF 30 or greater. This is very important if you are using acne medicines.  Choose cosmetics that will not block your oil glands (are noncomedogenic). Medicines  Take over-the-counter and prescription medicines only as told by your doctor.  If you were prescribed an antibiotic medicine, use it or take it as told by your doctor. Do not stop using the antibiotic even if your acne gets better. General instructions  Keep your hair clean and off your face. Shampoo your hair on a regular basis. If you have oily hair, you may need to wash it every day.  Avoid wearing tight headbands or hats.  Avoid picking or squeezing your pimples. That can make your acne worse and cause it to scar.  Shave gently. Only shave when you have to.    Keep a food journal. This can help you see if any foods are linked to your acne.  Keep all follow-up visits as told by your doctor. This is important. Contact a doctor if:  Your acne is not better after eight weeks.  Your acne gets worse.  You have a large area of skin that is red or tender.  You think that you are having side effects from any acne medicine. Summary  Acne is a skin problem that causes pimples. Acne is common among teenagers. Acne usually goes away over time.  Acne starts with changes in your  hormones. Other causes include stress, diet, and some medicines.  Follow your doctor's instructions on how to take care of your skin. Good skin care is the most important thing you can do to treat your acne.  Take over-the-counter and prescription medicines only as told by your doctor.  Contact your doctor if you think that you are having side effects from any acne medicine. This information is not intended to replace advice given to you by your health care provider. Make sure you discuss any questions you have with your health care provider. Document Released: 10/18/2011 Document Revised: 03/11/2018 Document Reviewed: 03/11/2018 Elsevier Patient Education  2020 [employer].

## 2019-09-24 NOTE — Telephone Encounter (Signed)
The patient cannot afford the cream that was prescribed and it is not covered.  She needs to know if something else is available and if not, she may need to come in to get it lanced.  Please call her back today at [phone].

## 2019-09-25 ENCOUNTER — Other Ambulatory Visit: Payer: Self-pay | Admitting: Family Medicine

## 2019-09-25 MED ORDER — BENZOYL PEROXIDE-ERYTHROMYCIN 5-3 % EX GEL: Freq: Two times a day (BID) | CUTANEOUS | 0 refills | Status: DC

## 2019-09-25 NOTE — Telephone Encounter (Signed)
Patient contacted and informed.

## 2019-09-25 NOTE — Progress Notes (Signed)
Tretinoin gel not covered, sending in benzamycin gel for pustular acne.

## 2019-09-28 NOTE — Telephone Encounter (Signed)
Pt called again. Second medication you sent in has been discontinued. Can we send in something else?  Please let pt know what she needs to do at this point.  Diane Harvey, CMA

## 2019-11-02 ENCOUNTER — Ambulatory Visit (INDEPENDENT_AMBULATORY_CARE_PROVIDER_SITE_OTHER): Payer: Self-pay

## 2019-11-02 ENCOUNTER — Other Ambulatory Visit: Payer: Self-pay

## 2019-11-02 DIAGNOSIS — Z111 Encounter for screening for respiratory tuberculosis: Secondary | ICD-10-CM

## 2019-11-02 NOTE — Progress Notes (Signed)
Patient presents in nurse clinic for PPD skin test. PPD applied to left ventral forearm, wheel present. Patient to return to have site read on 12/23 @130pm , reminder card given.

## 2019-11-04 ENCOUNTER — Other Ambulatory Visit: Payer: Self-pay

## 2019-11-04 ENCOUNTER — Ambulatory Visit (INDEPENDENT_AMBULATORY_CARE_PROVIDER_SITE_OTHER): Payer: Self-pay | Admitting: *Deleted

## 2019-11-04 DIAGNOSIS — Z111 Encounter for screening for respiratory tuberculosis: Secondary | ICD-10-CM

## 2019-11-04 LAB — TB SKIN TEST
Induration: 0 mm
TB Skin Test: NEGATIVE

## 2019-11-04 NOTE — Progress Notes (Signed)
Patient is here for a PPD read.  It was placed on 11/02/19 in the left forearm @ 9:46 am.    PPD RESULTS:  Result: Negative Induration: 0 mm  Letter created and given to patient for documentation purposes. Christen Bame, CMA

## 2019-12-11 ENCOUNTER — Telehealth: Payer: Self-pay

## 2019-12-11 ENCOUNTER — Other Ambulatory Visit: Payer: Self-pay | Admitting: Family Medicine

## 2019-12-11 DIAGNOSIS — M545 Low back pain, unspecified: Secondary | ICD-10-CM

## 2019-12-11 MED ORDER — MELOXICAM 15 MG PO TABS: 15.0000 mg | ORAL_TABLET | Freq: Every day | ORAL | 0 refills | Status: DC

## 2019-12-11 NOTE — Telephone Encounter (Signed)
Please let Diane Harvey know she would need to be seen in the office before pain medication can be prescribed.

## 2019-12-11 NOTE — Telephone Encounter (Signed)
Spoke with pt. Informed her of note. She said that she would call back Monday to see if she can be seen before the 9th. Salvatore Marvel, CMA

## 2019-12-11 NOTE — Telephone Encounter (Signed)
I left an HIPAA compliant message on her mobile phone.  I will refill her Morbic. Please help her schedule a virtual or in office visit in the next few days with any available provider.

## 2019-12-11 NOTE — Telephone Encounter (Signed)
Patient calls nurse line regarding lower abdominal pain. Pt states that PCP is aware of on-going issue and has recommended OTC pain medications. Pt states that she has tried ibuprofen with no relief. Patient requesting pain medication be sent in for her, as she does not have an appointment to be seen until Feb 9th.   To PCP  Talbot Grumbling, RN

## 2019-12-14 NOTE — Telephone Encounter (Signed)
Pt has appointment with PCP on 12/22/19. Salvatore Marvel, CMA

## 2019-12-22 ENCOUNTER — Ambulatory Visit: Payer: Medicaid Other | Admitting: Family Medicine

## 2020-01-04 ENCOUNTER — Telehealth: Payer: Self-pay | Admitting: *Deleted

## 2020-01-04 NOTE — Telephone Encounter (Signed)
LVM to call office to go over screening questions prior to visit.Aundra Pung Zimmerman Rumple, CMA  

## 2020-01-05 ENCOUNTER — Ambulatory Visit (INDEPENDENT_AMBULATORY_CARE_PROVIDER_SITE_OTHER): Payer: Self-pay | Admitting: Family Medicine

## 2020-01-05 ENCOUNTER — Encounter: Payer: Self-pay | Admitting: Family Medicine

## 2020-01-05 ENCOUNTER — Other Ambulatory Visit: Payer: Self-pay

## 2020-01-05 VITALS — BP 120/72 | HR 71 | Wt 226.4 lb

## 2020-01-05 DIAGNOSIS — R1907 Generalized intra-abdominal and pelvic swelling, mass and lump: Secondary | ICD-10-CM

## 2020-01-05 DIAGNOSIS — R102 Pelvic and perineal pain: Secondary | ICD-10-CM

## 2020-01-05 MED ORDER — TRAMADOL HCL 50 MG PO TABS: 50.0000 mg | ORAL_TABLET | Freq: Two times a day (BID) | ORAL | 0 refills | Status: AC | PRN

## 2020-01-05 MED ORDER — NYSTATIN 100000 UNIT/GM EX POWD: 1.0000 "application " | Freq: Three times a day (TID) | CUTANEOUS | 0 refills | Status: AC

## 2020-01-05 MED ORDER — IBUPROFEN 600 MG PO TABS: 600.0000 mg | ORAL_TABLET | Freq: Three times a day (TID) | ORAL | 1 refills | Status: DC | PRN

## 2020-01-05 NOTE — Assessment & Plan Note (Addendum)
Persistent. Skin lesion may be an abrasion vs candida intertrigo. Trial of Nystatin powder. New palpable mass on her left lower quadrant. Obtain CT abdomen to evaluate. Consider Gyn referral if normal CT and symptoms persists.  She agreed with the plan.

## 2020-01-05 NOTE — Progress Notes (Signed)
    SUBJECTIVE:   CHIEF COMPLAINT / HPI:   Groin pain: Still on going x 2 years since she had her tubal ligation. Pain is over her C/section scar. She described it as a sharp pain, on and off, with no trigger. Gradually worsening. She noticed a rash over her right groin area which has improved. Also a knot on her left groin which has not changed in size. Gyn exam: Here for follow-up. She had PAP done less than 5 years ago. No GU symptoms.  PERTINENT  PMH / PSH: PMX reviewed.  OBJECTIVE:   BP 120/72   Pulse 71   Wt 226 lb 6.4 oz (102.7 kg)   SpO2 98%   BMI 35.46 kg/m   Physical Exam Vitals and nursing note reviewed.  Cardiovascular:     Rate and Rhythm: Normal rate and regular rhythm.     Heart sounds: Normal heart sounds. No murmur.  Pulmonary:     Effort: Pulmonary effort is normal. No respiratory distress.     Breath sounds: Normal breath sounds. No wheezing.  Abdominal:     Palpations: Abdomen is soft. There is mass.     Tenderness: There is no abdominal tenderness.       Comments: Mild erythematous lesion on her right groin area, ??mild skin abrasion   Musculoskeletal:     Right lower leg: No edema.     Left lower leg: No edema.  Neurological:     Mental Status: She is alert.      ASSESSMENT/PLAN:   No problem-specific Assessment & Plan notes found for this encounter.    Gyne exam: Reviewed and discussed her PAP report with her. She opted for 5 yearly PAP test.  Andrena Mews, MD [place]

## 2020-01-05 NOTE — Patient Instructions (Signed)
It was nice seeing you today. I will like to get a CT of your abdomen due to the palpable mass I noticed today. I will call you with result and send your pain medicine to the pharmacy. We will try a short course of Tramadol and have you continue Ibuprofen as needed.

## 2020-01-11 ENCOUNTER — Ambulatory Visit (HOSPITAL_COMMUNITY): Payer: Medicaid Other

## 2020-06-28 ENCOUNTER — Ambulatory Visit: Payer: Medicaid Other

## 2020-06-28 ENCOUNTER — Ambulatory Visit: Payer: Medicaid Other | Admitting: Family Medicine

## 2020-07-05 ENCOUNTER — Other Ambulatory Visit: Payer: Self-pay

## 2020-07-05 ENCOUNTER — Ambulatory Visit (INDEPENDENT_AMBULATORY_CARE_PROVIDER_SITE_OTHER): Payer: Self-pay

## 2020-07-05 DIAGNOSIS — Z111 Encounter for screening for respiratory tuberculosis: Secondary | ICD-10-CM

## 2020-07-05 NOTE — Progress Notes (Signed)
Tuberculin skin test applied to left ventral forearm.   Patient to come back on 08/26 to have site read.   Reminder card given.

## 2020-07-07 ENCOUNTER — Other Ambulatory Visit: Payer: Self-pay

## 2020-07-07 ENCOUNTER — Ambulatory Visit: Payer: Medicaid Other

## 2020-07-07 DIAGNOSIS — Z111 Encounter for screening for respiratory tuberculosis: Secondary | ICD-10-CM

## 2020-07-07 LAB — TB SKIN TEST
Induration: 0 mm
TB Skin Test: NEGATIVE

## 2020-07-07 NOTE — Progress Notes (Signed)
PPD Reading Note  PPD read and results entered in [employer].  Result: 0 mm induration.  Interpretation: Negative  Allergic reaction: no

## 2020-10-19 ENCOUNTER — Telehealth: Payer: Self-pay | Admitting: Family Medicine

## 2020-10-19 NOTE — Telephone Encounter (Signed)
She declined the flu shot.  She confirmed that [facility] [place] [facility].Previous record indicates that she got her first dose in August. Will update her record.

## 2021-01-04 DIAGNOSIS — R569 Unspecified convulsions: Secondary | ICD-10-CM

## 2021-01-04 HISTORY — DX: Unspecified convulsions: R56.9

## 2021-01-05 ENCOUNTER — Emergency Department (HOSPITAL_COMMUNITY): Payer: Medicaid Other

## 2021-01-05 ENCOUNTER — Emergency Department (HOSPITAL_COMMUNITY)
Admission: EM | Admit: 2021-01-05 | Discharge: 2021-01-05 | Disposition: A | Discharge status: Home / Self Care | Payer: Medicaid Other | Attending: Emergency Medicine | Admitting: Emergency Medicine

## 2021-01-05 ENCOUNTER — Encounter (HOSPITAL_COMMUNITY): Payer: Self-pay | Admitting: *Deleted

## 2021-01-05 DIAGNOSIS — R109 Unspecified abdominal pain: Secondary | ICD-10-CM | POA: Insufficient documentation

## 2021-01-05 DIAGNOSIS — R569 Unspecified convulsions: Secondary | ICD-10-CM | POA: Insufficient documentation

## 2021-01-05 DIAGNOSIS — R197 Diarrhea, unspecified: Secondary | ICD-10-CM | POA: Insufficient documentation

## 2021-01-05 LAB — URINALYSIS, ROUTINE W REFLEX MICROSCOPIC
Bilirubin Urine: NEGATIVE
Glucose, UA: NEGATIVE mg/dL
Ketones, ur: NEGATIVE mg/dL
Leukocytes,Ua: NEGATIVE
Nitrite: NEGATIVE
Protein, ur: NEGATIVE mg/dL
Specific Gravity, Urine: 1.011 (ref 1.005–1.030)
pH: 6 (ref 5.0–8.0)

## 2021-01-05 LAB — I-STAT BETA HCG BLOOD, ED (MC, WL, AP ONLY): I-stat hCG, quantitative: <5 m[IU]/mL (ref <5)

## 2021-01-05 LAB — CBC
HCT: 39.3 % (ref 36.0–46.0)
Hemoglobin: 12.9 g/dL (ref 12.0–15.0)
MCH: 26 pg (ref 26.0–34.0)
MCHC: 32.8 g/dL (ref 30.0–36.0)
MCV: 79.1 fL — ABNORMAL LOW (ref 80.0–100.0)
Platelets: 378 10*3/uL (ref 150–400)
RBC: 4.97 MIL/uL (ref 3.87–5.11)
RDW: 15.4 % (ref 11.5–15.5)
WBC: 9.1 10*3/uL (ref 4.0–10.5)
nRBC: 0 % (ref 0.0–0.2)

## 2021-01-05 LAB — COMPREHENSIVE METABOLIC PANEL
ALT: 11 U/L (ref 0–44)
AST: 20 U/L (ref 15–41)
Albumin: 3.5 g/dL (ref 3.5–5.0)
Alkaline Phosphatase: 49 U/L (ref 38–126)
Anion gap: 12 (ref 5–15)
BUN: 7 mg/dL (ref 6–20)
CO2: 22 mmol/L (ref 22–32)
Calcium: 9.1 mg/dL (ref 8.9–10.3)
Chloride: 101 mmol/L (ref 98–111)
Creatinine, Ser: 0.81 mg/dL (ref 0.44–1.00)
GFR, Estimated: >60 mL/min (ref >60)
Glucose, Bld: 135 mg/dL — ABNORMAL HIGH (ref 70–99)
Potassium: 2.8 mmol/L — ABNORMAL LOW (ref 3.5–5.1)
Sodium: 135 mmol/L (ref 135–145)
Total Bilirubin: 0.9 mg/dL (ref 0.3–1.2)
Total Protein: 7.6 g/dL (ref 6.5–8.1)

## 2021-01-05 LAB — CBG MONITORING, ED: Glucose-Capillary: 121 mg/dL — ABNORMAL HIGH (ref 70–99)

## 2021-01-05 LAB — MAGNESIUM: Magnesium: 2 mg/dL (ref 1.7–2.4)

## 2021-01-05 MED ORDER — POTASSIUM CHLORIDE CRYS ER 20 MEQ PO TBCR
40.0000 meq | EXTENDED_RELEASE_TABLET | Freq: Once | ORAL | Status: AC
  Administered 2021-01-05: 40 meq via ORAL
  Filled 2021-01-05: qty 2

## 2021-01-05 MED ORDER — SODIUM CHLORIDE 0.9 % IV BOLUS
1000.0000 mL | Freq: Once | INTRAVENOUS | Status: AC
  Administered 2021-01-05: 1000 mL via INTRAVENOUS

## 2021-01-05 MED ORDER — ACETAMINOPHEN 325 MG PO TABS
650.0000 mg | ORAL_TABLET | Freq: Once | ORAL | Status: AC
  Administered 2021-01-05: 650 mg via ORAL
  Filled 2021-01-05: qty 2

## 2021-01-05 MED ORDER — POTASSIUM CHLORIDE CRYS ER 20 MEQ PO TBCR: 20.0000 meq | EXTENDED_RELEASE_TABLET | Freq: Every day | ORAL | 0 refills | Status: DC

## 2021-01-05 NOTE — ED Notes (Signed)
Patient transported to CT scan . 

## 2021-01-05 NOTE — ED Triage Notes (Signed)
Pt from home by EMS for seizure like activity, full body convulsions per husband. No known hx of seizures. Has been having diarrhea all day after eating [place]. Oral trauma reported. 359ml NS given enroute

## 2021-01-05 NOTE — Discharge Instructions (Signed)
You were seen in the emergency department tonight following a seizure.  Your blood work showed that your potassium was somewhat low, we are sending you home with a potassium supplement to take daily for the next few days as well as diet guidelines.  We have prescribed you new medication(s) today. Discuss the medications prescribed today with your pharmacist as they can have adverse effects and interactions with your other medicines including over the counter and prescribed medications. Seek medical evaluation if you start to experience new or abnormal symptoms after taking one of these medicines, seek care immediately if you start to experience difficulty breathing, feeling of your throat closing, facial swelling, or rash as these could be indications of a more serious allergic reaction  Do not drive or operate heavy machinery of any kind until cleared by neurology.  Please follow-up with the neurologist your discharge instructions within the next 3 days, we have sent a referral for this.  Please try to get plenty of sleep, avoid drug/alcohol, avoid stimulants/caffeine products, try to rest.  Return to the ER for any new or worsening symptoms including but not limited to recurrence of seizure, change in your vision, headaches, numbness, weakness, tremors, fever, neck stiffness, or any other concerns.

## 2021-01-05 NOTE — ED Provider Notes (Signed)
[facility] EMERGENCY DEPARTMENT Provider Note   CSN: [id number] Arrival date & time: 01/05/21  0031     History Chief Complaint  Patient presents with  . Seizures    [facility] is a 40 y.o. female without significant past medical hx who presents to the ED via EMS S/p seizure activity shortly PTA. Per EMS report patient was sleeping when her husband witnessed her seem to lock up with subsequent generalized shaking for a couple of minutes prior to resolution. Upon EMS arrival patient appeared post ictal with tongue injury. CBG 120s. On arrival to the ED patient states she remembers going to bed, had a mild stomach ache and had some diarrhea after having Zaxbys, but was able to go to sleep, the next thing she remembers is feeling out of it and EMS being in her home. She had some right lower extremity tremulous activity briefly when she came back to that has resolved. No alleviating/aggravating factors. She currently is asymptomatic. She has had some increased stress at work recently and has had variable sleep pattern.  She denies fever, chills, nausea, vomiting, chest pain, dyspnea, dysuria, numbness, weakness, visual disturbance, or headache.  She denies alcohol or drug use.  No recent new medications or change in diet. She does not take any prescription medications currently. Currently at the end of her menstrual cycle. No family hx of seizures.   HPI     Past Medical History:  Diagnosis Date  . Anemia   . Arthritis    lower back  . Back pain   . Bilateral knee pain 06/22/2014   No current problems as of 03/17/18  . Contraception 07/31/2011   Orthotricyclen 07/19/15   . Headache    otc med prn  . Knee pain 07/31/2011   Seen orthopedist for this.  Unknown name.  Has tried injections with good results.  Plans on returning.  No current problems as of 03/17/18  . Supervision of other normal pregnancy 04/07/2015    Clinic/provider [facility] Cordelia Poche)  Prenatal Labs  Dating LMP and 21w u/s Blood type: O/POS/-- (02/16 1350)   Genetic Screen declined Antibody:NEG (02/16 1350)  Anatomic [place] HC<3rd %ile at 25w Rubella: 2.16 (02/16 1350)  GTT Early: 125               Third trimester: 3 hour normal RPR: NON REAC (05/26 0909)   Flu vaccine Off season HBsAg: NEGATIVE (02/16 1350)   TDaP vaccine 04/07/15                                              Rhogam: not indicated HIV: NONREACTIVE (05/26 0909)   GBS      Negative                                (For PCN allergy, check sensitivities) GBS:NOT DETECTED (07/01 1440)  Contraception BTL - consent signed 04/07/15; Changed mind and wants Depo Pap: 09/26/12 - negative w/ +HR HPV, needs repeat pap postpartum  Baby Food Bottle   Circumcision N/a   Pediatrician [place]       . SVD (spontaneous vaginal delivery)    x 3    Patient Active Problem List   Diagnosis Date Noted  . Acne  vulgaris 09/23/2019  . Pelvic pain 07/31/2019  . Irregular menses 07/31/2019  . Chronic knee pain 11/18/2015  . Lumbar pain 08/30/2015  . Hemorrhoid 05/20/2015  . Carpal tunnel syndrome 10/27/2013  . Obesity 09/26/2012    Past Surgical History:  Procedure Laterality Date  . CARPAL TUNNEL RELEASE Bilateral    2 separate surgeries  . CESAREAN SECTION N/A 06/09/2015   Procedure: CESAREAN SECTION;  Surgeon: Osborne Oman, MD;  Location: [facility];  Service: Obstetrics;  Laterality: N/A;  . LAPAROSCOPIC TUBAL LIGATION Bilateral 03/25/2018   Procedure: LAPAROSCOPIC TUBAL LIGATION;  Surgeon: Emily Filbert, MD;  Location: [facility];  Service: Gynecology;  Laterality: Bilateral;  . WISDOM TOOTH EXTRACTION       OB History    Gravida  4   Para  4   Term  4   Preterm      AB      Living  4     SAB      IAB      Ectopic      Multiple  0   Live Births  1           Family History  Problem Relation Age of Onset  . Hypertension Mother   . Hypertension Father   . Diabetes Father   .  Hyperlipidemia Father   . Cancer Paternal Grandfather        throat?  . Heart disease Neg Hx   . Stroke Neg Hx     Social History   Tobacco Use  . Smoking status: Never Smoker  . Smokeless tobacco: Never Used  Vaping Use  . Vaping Use: Never used  Substance Use Topics  . Alcohol use: No  . Drug use: No    Home Medications Prior to Admission medications   Medication Sig Start Date End Date Taking? Authorizing Provider  benzoyl peroxide-erythromycin (BENZAMYCIN) gel Apply topically 2 (two) times daily. 09/25/19   Nuala Alpha, DO  cyclobenzaprine (FLEXERIL) 10 MG tablet Take 0.5-1 tablets (5-10 mg total) by mouth 3 (three) times daily as needed for muscle spasms. 03/17/19   Tereasa Coop, PA-C  doxycycline (VIBRA-TABS) 100 MG tablet Take 1 tablet (100 mg total) by mouth 2 (two) times daily. 09/23/19   Nuala Alpha, DO  ibuprofen (ADVIL) 600 MG tablet Take 1 tablet (600 mg total) by mouth every 8 (eight) hours as needed. 01/05/20   Kinnie Feil, MD  meloxicam (MOBIC) 15 MG tablet Take 1 tablet (15 mg total) by mouth daily. 12/11/19   Kinnie Feil, MD    Allergies    Patient has no known allergies.  Review of Systems   Review of Systems  Constitutional: Negative for chills and fever.  Respiratory: Negative for cough and shortness of breath.   Cardiovascular: Negative for chest pain.  Gastrointestinal: Positive for abdominal pain (ache, resolved) and diarrhea. Negative for blood in stool, constipation, nausea and vomiting.  Genitourinary: Negative for dysuria.  Neurological: Positive for tremors (RLE resolved @ present) and seizures. Negative for weakness, numbness and headaches.  All other systems reviewed and are negative.   Physical Exam Updated Vital Signs There were no vitals taken for this visit.  Physical Exam Vitals and nursing note reviewed.  Constitutional:      General: She is not in acute distress.    Appearance: She is well-developed. She is  not toxic-appearing.  HENT:     Head: Normocephalic and atraumatic.     Mouth/Throat:  Mouth: Mucous membranes are moist.     Comments: Abrasion to left lateral tongue, no active bleeding. No hematoma noted.  Eyes:     General: Vision grossly intact. Gaze aligned appropriately.        Right eye: No discharge.        Left eye: No discharge.     Extraocular Movements: Extraocular movements intact.     Conjunctiva/sclera: Conjunctivae normal.     Comments: PERRL. No proptosis.   Cardiovascular:     Rate and Rhythm: Normal rate and regular rhythm.  Pulmonary:     Effort: Pulmonary effort is normal. No respiratory distress.     Breath sounds: Normal breath sounds. No wheezing, rhonchi or rales.  Abdominal:     General: There is no distension.     Palpations: Abdomen is soft.     Tenderness: There is no abdominal tenderness. There is no guarding or rebound.  Musculoskeletal:     Cervical back: Normal range of motion and neck supple. No rigidity or tenderness.  Skin:    General: Skin is warm and dry.     Findings: No rash.  Neurological:     Comments: Alert. Clear speech. No facial droop. CNIII-XII grossly intact. Bilateral upper and lower extremities' sensation grossly intact. 5/5 symmetric strength with grip strength and with plantar and dorsi flexion bilaterally . Normal finger to nose bilaterally. Negative pronator drift. Negative Romberg sign. Gait is steady and intact.   Psychiatric:        Behavior: Behavior normal.     ED Results / Procedures / Treatments   Labs (all labs ordered are listed, but only abnormal results are displayed) Labs Reviewed  URINALYSIS, ROUTINE W REFLEX MICROSCOPIC - Abnormal; Notable for the following components:      Result Value   Color, Urine STRAW (*)    Hgb urine dipstick MODERATE (*)    Bacteria, UA RARE (*)    All other components within normal limits  CBC - Abnormal; Notable for the following components:   MCV 79.1 (*)    All other  components within normal limits  COMPREHENSIVE METABOLIC PANEL - Abnormal; Notable for the following components:   Potassium 2.8 (*)    Glucose, Bld 135 (*)    All other components within normal limits  CBG MONITORING, ED - Abnormal; Notable for the following components:   Glucose-Capillary 121 (*)    All other components within normal limits  MAGNESIUM  I-STAT BETA HCG BLOOD, ED ([facility], [facility], [facility] ONLY)    EKG None  Radiology CT Head Wo Contrast  Result Date: 01/05/2021 CLINICAL DATA:  Seizure. EXAM: CT HEAD WITHOUT CONTRAST TECHNIQUE: Contiguous axial images were obtained from the base of the skull through the vertex without intravenous contrast. COMPARISON:  June 20, 2016 FINDINGS: Brain: No evidence of acute infarction, hemorrhage, hydrocephalus, extra-axial collection or mass lesion/mass effect. Vascular: No hyperdense vessel or unexpected calcification. Skull: Normal. Negative for fracture or focal lesion. Sinuses/Orbits: No acute finding. Other: Moderate to marked severity scalp soft tissue thickening is seen along the posterior aspect of the vertex. This is increased in severity when compared to the prior study. IMPRESSION: 1. No acute intracranial abnormality. 2. Scalp soft tissue thickening along the posterior aspect of the vertex, increased in severity when compared to the prior exam. Correlation with physical examination is recommended. Electronically Signed   By: Virgina Norfolk M.D.   On: 01/05/2021 01:13   Procedures Procedures   Medications Ordered in ED Medications -  No data to display  ED Course  I have reviewed the triage vital signs and the nursing notes.  Pertinent labs & imaging results that were available during my care of the patient were reviewed by me and considered in my medical decision making (see chart for details).    MDM Rules/Calculators/A&P                         Patient presents to the ED with complaints of seizure activity this evening shortly PTA.   Patient is nontoxic, vitals w/ somewhat elevated BP and mild intermittent tachycardia, HR 88 on my exam. Currently asymptomatic without neurologic deficits, ambulatory in the ED. Hx and findings of tongue abrasion seem consistent w/ first time seizure.  Additional history obtained:  Additional history obtained from EMS, chart review & nursing note review.   Lab Tests:  I Ordered, reviewed, and interpreted labs, which included:  CBG: 121 Pregnancy test: Negative CBC: No significant anemia. CMP: Hypokalemia at 2.8. Magnesium: Within normal limits Urinalysis: Blood present likely consistent with her pain towards the end of her menstrual cycle.   Imaging Studies ordered:  I ordered imaging studies which included CT head without contrast, I independently reviewed, formal radiology impression shows:  1. No acute intracranial abnormality. 2. Scalp soft tissue thickening along the posterior aspect of the vertex, increased in severity when compared to the prior exam. Correlation with physical examination is recommended.---> patient has area of scalp thickness noted on further assessment, no signs of infection, no open wounds, no hematoma, she relays she had a wave there 1 time which led to the scalp thickness.  EKG: NSR, no significant QTc prolongation  ED Course:  Patient has remained feeling well in the emergency department.  She states that she has a mild headache but is otherwise asymptomatic.  She remains without focal neurologic deficits. Afebrile without meningismus or other signs of infection. Unclear definitive etiology to her seizure.  She was given oral potassium in the emergency department and will be discharged home with a supplement.  We discussed no driving or operating heavy machinery until cleared by neurology.  Ambulatory referral to neurology placed.  Overall appears appropriate for discharge home at this time. I discussed results, treatment plan, need for follow-up, and return  precautions with the patient. Provided opportunity for questions, patient confirmed understanding and is in agreement with plan.   Findings and plan of care discussed with supervising physician Dr. Leonette Monarch who is in agreement.   Portions of this note were generated with [employer]. Dictation errors may occur despite best attempts at proofreading.  Final Clinical Impression(s) / ED Diagnoses Final diagnoses:  Seizure ([place])    Rx / DC Orders ED Discharge Orders         Ordered    potassium chloride SA (KLOR-CON) 20 MEQ tablet  Daily        01/05/21 0316    Ambulatory referral to Neurology       Comments: An appointment is requested in approximately: 1 week   01/05/21 0318           Amaryllis Dyke, PA-C 01/05/21 0340    Fatima Blank, MD 01/05/21 [phone]

## 2021-01-06 ENCOUNTER — Telehealth: Payer: Self-pay | Admitting: *Deleted

## 2021-01-06 NOTE — Telephone Encounter (Signed)
Transition Care Management Follow-up Telephone Call  Date of discharge and from where: 01/05/2021 - [facility]  How have you been since you were released from the hospital? "I am fine"  Any questions or concerns? No  Items Reviewed:  Did the pt receive and understand the discharge instructions provided? Yes   Medications obtained and verified? Yes   Other? No   Any new allergies since your discharge? No   Dietary orders reviewed? Yes  Do you have support at home? Yes   Home Care and Equipment/Supplies: Were home health services ordered? not applicable If so, what is the name of the agency? N/A  Has the agency set up a time to come to the patient's home? not applicable Were any new equipment or medical supplies ordered?  No What is the name of the medical supply agency? N/A Were you able to get the supplies/equipment? not applicable Do you have any questions related to the use of the equipment or supplies? No  Functional Questionnaire: (I = Independent and D = Dependent) ADLs: I  Bathing/Dressing- I  Meal Prep- I  Eating- I  Maintaining continence- I  Transferring/Ambulation- I  Managing Meds- I  Follow up appointments reviewed:   PCP Hospital f/u appt confirmed? No   Specialist Hospital f/u appt confirmed? No  - referral is pending for Neuro appointment  Are transportation arrangements needed? No   If their condition worsens, is the pt aware to call PCP or go to the Emergency Dept.? Yes  Was the patient provided with contact information for the PCP's office or ED? Yes  Was to pt encouraged to call back with questions or concerns? Yes

## 2021-01-10 ENCOUNTER — Other Ambulatory Visit: Payer: Self-pay

## 2021-01-10 ENCOUNTER — Ambulatory Visit: Payer: Self-pay | Admitting: Neurology

## 2021-01-10 ENCOUNTER — Telehealth: Payer: Self-pay | Admitting: Neurology

## 2021-01-10 ENCOUNTER — Encounter: Payer: Self-pay | Admitting: Neurology

## 2021-01-10 DIAGNOSIS — G40909 Epilepsy, unspecified, not intractable, without status epilepticus: Secondary | ICD-10-CM | POA: Insufficient documentation

## 2021-01-10 DIAGNOSIS — R569 Unspecified convulsions: Secondary | ICD-10-CM

## 2021-01-10 HISTORY — DX: Unspecified convulsions: R56.9

## 2021-01-10 MED ORDER — ALPRAZOLAM 0.5 MG PO TABS
ORAL_TABLET | ORAL | 0 refills | Status: DC
Start: 2021-01-10 — End: 2021-05-12
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
## 2021-01-10 NOTE — Telephone Encounter (Signed)
self pay order sent to GI. They will reach out to the patient to schedule.  °

## 2021-01-24 ENCOUNTER — Other Ambulatory Visit: Payer: Medicaid Other

## 2021-02-07 ENCOUNTER — Other Ambulatory Visit: Payer: Self-pay

## 2021-02-23 ENCOUNTER — Other Ambulatory Visit: Payer: Self-pay

## 2021-02-23 ENCOUNTER — Telehealth: Payer: Self-pay | Admitting: Neurology

## 2021-02-23 ENCOUNTER — Ambulatory Visit: Payer: 59 | Admitting: Neurology

## 2021-02-23 DIAGNOSIS — R569 Unspecified convulsions: Secondary | ICD-10-CM

## 2021-02-23 NOTE — Telephone Encounter (Signed)
I called the patient.  EEG study was unremarkable, MRI of the brain is pending.

## 2021-02-23 NOTE — Procedures (Signed)
    History:  [facility] is a 40 year old patient with a history of a witnessed seizure that occurred out of sleep on 05 January 2021.  The patient had generalized jerking that was witnessed by her husband associated with tongue biting.  The patient did have a low potassium level in the emergency room, otherwise blood work was unremarkable.  The patient is being evaluated for the seizure.  This is a sleep deprived EEG.  No skull defects are noted.  Medications include Flexeril, Advil, Mobic, and potassium.  EEG classification: Normal awake and drowsy  Description of the recording: The background rhythms of this recording consists of a fairly well modulated medium amplitude alpha rhythm of 10 Hz that is reactive to eye opening and closure. As the record progresses, the patient appears to remain in the waking state throughout the recording. Photic stimulation was performed, resulting in a bilateral and symmetric photic driving response. Hyperventilation was also performed, resulting in a minimal buildup of the background rhythm activities without significant slowing seen. Toward the end of the recording, the patient enters the drowsy state with slight symmetric slowing seen. The patient never enters stage II sleep. At no time during the recording does there appear to be evidence of spike or spike wave discharges or evidence of focal slowing. EKG monitor shows no evidence of cardiac rhythm abnormalities with a heart rate of 72.  Impression: This is a normal EEG recording in the waking and drowsy state. No evidence of ictal or interictal discharges are seen.

## 2021-05-12 ENCOUNTER — Ambulatory Visit (INDEPENDENT_AMBULATORY_CARE_PROVIDER_SITE_OTHER): Payer: Medicaid Other | Admitting: Family Medicine

## 2021-05-12 ENCOUNTER — Encounter: Payer: Self-pay | Admitting: Family Medicine

## 2021-05-12 ENCOUNTER — Other Ambulatory Visit: Payer: Self-pay

## 2021-05-12 VITALS — BP 149/98 | HR 70 | Ht 67.0 in | Wt 230.0 lb

## 2021-05-12 DIAGNOSIS — R1909 Other intra-abdominal and pelvic swelling, mass and lump: Secondary | ICD-10-CM

## 2021-05-12 DIAGNOSIS — M7660 Achilles tendinitis, unspecified leg: Secondary | ICD-10-CM | POA: Insufficient documentation

## 2021-05-12 DIAGNOSIS — G40909 Epilepsy, unspecified, not intractable, without status epilepticus: Secondary | ICD-10-CM

## 2021-05-12 DIAGNOSIS — R109 Unspecified abdominal pain: Secondary | ICD-10-CM

## 2021-05-12 DIAGNOSIS — M7662 Achilles tendinitis, left leg: Secondary | ICD-10-CM

## 2021-05-12 NOTE — Assessment & Plan Note (Signed)
Not changed from previous exam. She did not follow through with CT exam. I think abdominal and pelvic U/S will be a sufficient eval tool for now. I also offered CT abdomen. We opted for [place] for now. ED precaution discussed.

## 2021-05-12 NOTE — Assessment & Plan Note (Signed)
I reviewed her ED visit, neurology report and imaging. Currently not on antiseizure meds. I advised her to contact neuro soon for reassessment. She agreed with the plan.

## 2021-05-12 NOTE — Assessment & Plan Note (Signed)
Likely due to over use from walking exercise. Use Ibuprofen prn. Monitor closely for improvement.

## 2021-05-12 NOTE — Progress Notes (Signed)
    SUBJECTIVE:   CHIEF COMPLAINT / HPI:   Suprapubic abdominal pain: Ongoing for months. Was previously evaluated for this, but did not follow-up with the CT abdomen ordered. Worse with cycle. Feel like labor pain.No VD. LMP: 04/14/21 regular.  Seizure: S/P full neuro work-up. She had a recent episode this week.   Foot pain: C/O left posterior foot pain around her achillis. Started 3 weeks ago since she started her walking exercise. No swelling or redness.  PERTINENT  PMH / PSH: PMX reviewed.  OBJECTIVE:   BP (!) 149/98   Pulse 70   Ht 5\' 7"  (1.702 m)   Wt 230 lb (104.3 kg)   LMP 04/14/2021   SpO2 92%   BMI 36.02 kg/m   Physical Exam Vitals reviewed.  Cardiovascular:     Rate and Rhythm: Normal rate and regular rhythm.     Heart sounds: Normal heart sounds. No murmur heard. Pulmonary:     Effort: Pulmonary effort is normal. No respiratory distress.     Breath sounds: Normal breath sounds. No wheezing.  Abdominal:     General: Bowel sounds are normal. There is no distension.     Palpations: Abdomen is soft. There is mass.     Comments: Round, firm, about 4cm/5cm suprapubic mass, moderately tender to touch.  Musculoskeletal:     Left foot: Normal range of motion. No deformity or foot drop.  Feet:     Comments: Mild achillis tenderness. Neurological:     Mental Status: She is alert.     ASSESSMENT/PLAN:   Suprapubic mass Not changed from previous exam. She did not follow through with CT exam. I think abdominal and pelvic U/S will be a sufficient eval tool for now. I also offered CT abdomen. We opted for [place] for now. ED precaution discussed.   Seizure disorder [facility]) I reviewed her ED visit, neurology report and imaging. Currently not on antiseizure meds. I advised her to contact neuro soon for reassessment. She agreed with the plan.   Achilles tendinitis Likely due to over use from walking exercise. Use Ibuprofen prn. Monitor closely for improvement.      Hep C screening offered, but she declined. Schedule PAP in Oct discussed.  Andrena Mews, MD [place]

## 2021-05-12 NOTE — Patient Instructions (Signed)
https://www.nhlbi.nih.gov/files/docs/public/heart/dash_brief.pdf">  DASH Eating Plan DASH stands for Dietary Approaches to Stop Hypertension. The DASH eating plan is a healthy eating plan that has been shown to: Reduce high blood pressure (hypertension). Reduce your risk for type 2 diabetes, heart disease, and stroke. Help with weight loss. What are tips for following this plan? Reading food labels Check food labels for the amount of salt (sodium) per serving. Choose foods with less than 5 percent of the Daily Value of sodium. Generally, foods with less than 300 milligrams (mg) of sodium per serving fit into this eating plan. To find whole grains, look for the word "whole" as the first word in the ingredient list. Shopping Buy products labeled as "low-sodium" or "no salt added." Buy fresh foods. Avoid canned foods and pre-made or frozen meals. Cooking Avoid adding salt when cooking. Use salt-free seasonings or herbs instead of table salt or sea salt. Check with your health care provider or pharmacist before using salt substitutes. Do not fry foods. Cook foods using healthy methods such as baking, boiling, grilling, roasting, and broiling instead. Cook with heart-healthy oils, such as olive, canola, avocado, soybean, or sunflower oil. Meal planning  Eat a balanced diet that includes: 4 or more servings of fruits and 4 or more servings of vegetables each day. Try to fill one-half of your plate with fruits and vegetables. 6-8 servings of whole grains each day. Less than 6 oz (170 g) of lean meat, poultry, or fish each day. A 3-oz (85-g) serving of meat is about the same size as a deck of cards. One egg equals 1 oz (28 g). 2-3 servings of low-fat dairy each day. One serving is 1 cup (237 mL). 1 serving of nuts, seeds, or beans 5 times each week. 2-3 servings of heart-healthy fats. Healthy fats called omega-3 fatty acids are found in foods such as walnuts, flaxseeds, fortified milks, and eggs.  These fats are also found in cold-water fish, such as sardines, salmon, and mackerel. Limit how much you eat of: Canned or prepackaged foods. Food that is high in trans fat, such as some fried foods. Food that is high in saturated fat, such as fatty meat. Desserts and other sweets, sugary drinks, and other foods with added sugar. Full-fat dairy products. Do not salt foods before eating. Do not eat more than 4 egg yolks a week. Try to eat at least 2 vegetarian meals a week. Eat more home-cooked food and less restaurant, buffet, and fast food.  Lifestyle When eating at a restaurant, ask that your food be prepared with less salt or no salt, if possible. If you drink alcohol: Limit how much you use to: 0-1 drink a day for women who are not pregnant. 0-2 drinks a day for men. Be aware of how much alcohol is in your drink. In the [place] one drink equals one 12 oz bottle of beer (355 mL), one 5 oz glass of wine (148 mL), or one 1 oz glass of hard liquor (44 mL). General information Avoid eating more than 2,300 mg of salt a day. If you have hypertension, you may need to reduce your sodium intake to 1,500 mg a day. Work with your health care provider to maintain a healthy body weight or to lose weight. Ask what an ideal weight is for you. Get at least 30 minutes of exercise that causes your heart to beat faster (aerobic exercise) most days of the week. Activities may include walking, swimming, or biking. Work with your health care provider   or dietitian to adjust your eating plan to your individual calorie needs. What foods should I eat? Fruits All fresh, dried, or frozen fruit. Canned fruit in natural juice (without addedsugar). Vegetables Fresh or frozen vegetables (raw, steamed, roasted, or grilled). Low-sodium or reduced-sodium tomato and vegetable juice. Low-sodium or reduced-sodium tomatosauce and tomato paste. Low-sodium or reduced-sodium canned vegetables. Grains Whole-grain or  whole-wheat bread. Whole-grain or whole-wheat pasta. Brown rice. Oatmeal. Quinoa. Bulgur. Whole-grain and low-sodium cereals. Pita bread.Low-fat, low-sodium crackers. Whole-wheat flour tortillas. Meats and other proteins Skinless chicken or turkey. Ground chicken or turkey. Pork with fat trimmed off. Fish and seafood. Egg whites. Dried beans, peas, or lentils. Unsalted nuts, nut butters, and seeds. Unsalted canned beans. Lean cuts of beef with fat trimmed off. Low-sodium, lean precooked or cured meat, such as sausages or meatloaves. Dairy Low-fat (1%) or fat-free (skim) milk. Reduced-fat, low-fat, or fat-free cheeses. Nonfat, low-sodium ricotta or cottage cheese. Low-fat or nonfatyogurt. Low-fat, low-sodium cheese. Fats and oils Soft margarine without trans fats. Vegetable oil. Reduced-fat, low-fat, or light mayonnaise and salad dressings (reduced-sodium). Canola, safflower, olive, avocado, soybean, andsunflower oils. Avocado. Seasonings and condiments Herbs. Spices. Seasoning mixes without salt. Other foods Unsalted popcorn and pretzels. Fat-free sweets. The items listed above may not be a complete list of foods and beverages you can eat. Contact a dietitian for more information. What foods should I avoid? Fruits Canned fruit in a light or heavy syrup. Fried fruit. Fruit in cream or buttersauce. Vegetables Creamed or fried vegetables. Vegetables in a cheese sauce. Regular canned vegetables (not low-sodium or reduced-sodium). Regular canned tomato sauce and paste (not low-sodium or reduced-sodium). Regular tomato and vegetable juice(not low-sodium or reduced-sodium). Pickles. Olives. Grains Baked goods made with fat, such as croissants, muffins, or some breads. Drypasta or rice meal packs. Meats and other proteins Fatty cuts of meat. Ribs. Fried meat. Bacon. Bologna, salami, and other precooked or cured meats, such as sausages or meat loaves. Fat from the back of a pig (fatback). Bratwurst.  Salted nuts and seeds. Canned beans with added salt. Canned orsmoked fish. Whole eggs or egg yolks. Chicken or turkey with skin. Dairy Whole or 2% milk, cream, and half-and-half. Whole or full-fat cream cheese. Whole-fat or sweetened yogurt. Full-fat cheese. Nondairy creamers. Whippedtoppings. Processed cheese and cheese spreads. Fats and oils Butter. Stick margarine. Lard. Shortening. Ghee. Bacon fat. Tropical oils, suchas coconut, palm kernel, or palm oil. Seasonings and condiments Onion salt, garlic salt, seasoned salt, table salt, and sea salt. Worcestershire sauce. Tartar sauce. Barbecue sauce. Teriyaki sauce. Soy sauce, including reduced-sodium. Steak sauce. Canned and packaged gravies. Fish sauce. Oyster sauce. Cocktail sauce. Store-bought horseradish. Ketchup. Mustard. Meat flavorings and tenderizers. Bouillon cubes. Hot sauces. Pre-made or packaged marinades. Pre-made or packaged taco seasonings. Relishes. Regular saladdressings. Other foods Salted popcorn and pretzels. The items listed above may not be a complete list of foods and beverages you should avoid. Contact a dietitian for more information. Where to find more information National Heart, Lung, and Blood Institute: www.nhlbi.nih.gov American Heart Association: www.heart.org Academy of Nutrition and Dietetics: www.eatright.org National Kidney Foundation: www.kidney.org Summary The DASH eating plan is a healthy eating plan that has been shown to reduce high blood pressure (hypertension). It may also reduce your risk for type 2 diabetes, heart disease, and stroke. When on the DASH eating plan, aim to eat more fresh fruits and vegetables, whole grains, lean proteins, low-fat dairy, and heart-healthy fats. With the DASH eating plan, you should limit salt (sodium) intake to 2,300   mg a day. If you have hypertension, you may need to reduce your sodium intake to 1,500 mg a day. Work with your health care provider or dietitian to adjust  your eating plan to your individual calorie needs. This information is not intended to replace advice given to you by your health care provider. Make sure you discuss any questions you have with your healthcare provider. Document Revised: 10/02/2019 Document Reviewed: 10/02/2019 Elsevier Patient Education  2022 Elsevier Inc.  

## 2021-05-16 ENCOUNTER — Telehealth: Payer: Self-pay

## 2021-05-16 DIAGNOSIS — R569 Unspecified convulsions: Secondary | ICD-10-CM

## 2021-05-16 MED ORDER — LEVETIRACETAM 500 MG PO TABS: 500.0000 mg | ORAL_TABLET | Freq: Two times a day (BID) | ORAL | 3 refills | Status: DC

## 2021-05-16 NOTE — Telephone Encounter (Signed)
Spoke with the patient over the phone, she states she is having more frequent seizure, two in the last two weeks. Her PCP advised her to call our office to discuss any medications she could be placed on. Can you please give the patient a call to discuss this?  Thank you

## 2021-05-16 NOTE — Telephone Encounter (Signed)
I called the patient.  The patient indicates that she is having episodes once a week where she will feel dizzy in the head and then have a event of loss of consciousness, blacking out.  This is happening during the day.  She never had MRI of the brain done, EEG study was done and was normal.  I will go ahead and start Keppra, I will reorder the MRI of the brain.

## 2021-05-16 NOTE — Addendum Note (Signed)
Addended by: Kathrynn Ducking on: 05/16/2021 05:54 PM   Modules accepted: Orders

## 2021-05-18 NOTE — Telephone Encounter (Signed)
Patient has [employer]. I filled out PA request form for MRI brain w/wo contrast & faxed to plan with notes. Pending.

## 2021-05-22 ENCOUNTER — Telehealth: Payer: Self-pay | Admitting: *Deleted

## 2021-05-22 NOTE — Telephone Encounter (Signed)
Carrie with [facility] radiology called about upcoming ultrasounds for this patient.  Per the ultrasound tech, the US abdomen needed a diagnosis besides suprapubic mass in order for it to be performed.  Looked into notes and there was mention of "Suprapubic abdominal pain" , so I addended the encounter to reflect abdominal pain for this imaging.  If the only complaint was the mass, then the tech said the US pelvis complete was the only thing needed.  Will forward to MD as an FYI.  Hillary Struss,CMA

## 2021-05-22 NOTE — Telephone Encounter (Signed)
Received approval from insurance. [employer] #[id number] (05/19/21-08/19/21). Faxed order to [facility].

## 2021-05-23 ENCOUNTER — Ambulatory Visit (HOSPITAL_COMMUNITY)
Admission: RE | Admit: 2021-05-23 | Discharge: 2021-05-23 | Disposition: A | Discharge status: Home / Self Care | Payer: 59 | Source: Ambulatory Visit | Attending: Family Medicine | Admitting: Family Medicine

## 2021-05-23 ENCOUNTER — Encounter (HOSPITAL_COMMUNITY): Payer: Self-pay

## 2021-05-23 ENCOUNTER — Other Ambulatory Visit: Payer: Self-pay

## 2021-05-23 ENCOUNTER — Other Ambulatory Visit: Payer: Self-pay | Admitting: Family Medicine

## 2021-05-23 DIAGNOSIS — R1909 Other intra-abdominal and pelvic swelling, mass and lump: Secondary | ICD-10-CM

## 2021-05-23 DIAGNOSIS — R109 Unspecified abdominal pain: Secondary | ICD-10-CM

## 2021-05-25 ENCOUNTER — Telehealth: Payer: Self-pay | Admitting: Family Medicine

## 2021-05-25 DIAGNOSIS — R1909 Other intra-abdominal and pelvic swelling, mass and lump: Secondary | ICD-10-CM

## 2021-05-25 DIAGNOSIS — R1900 Intra-abdominal and pelvic swelling, mass and lump, unspecified site: Secondary | ICD-10-CM

## 2021-05-25 MED ORDER — IBUPROFEN 600 MG PO TABS: 600.0000 mg | ORAL_TABLET | Freq: Three times a day (TID) | ORAL | 0 refills | Status: DC | PRN

## 2021-05-25 NOTE — Addendum Note (Signed)
Addended by: Andrena Mews T on: 05/25/2021 01:28 PM   Modules accepted: Orders

## 2021-05-25 NOTE — Telephone Encounter (Signed)
HIPAA compliant callback message left.   NB: US showed non-specific mass. MRI of her abdomen recommended to further evaluate this mass.  I do agree with the MRI recommendation, but I need to check with the patient if she wants to proceed with further imaging.

## 2021-05-25 NOTE — Telephone Encounter (Signed)
Will contact [facility] tomorrow to make MRI of abdomen. Salvatore Marvel, CMA

## 2021-05-25 NOTE — Telephone Encounter (Signed)
I discussed the result with her. She is ok to proceed with MRI which I ordered. Staff will reach out to schedule.  She also requested pain meds for her stomach. I refilled her Ibuprofen.

## 2021-05-26 NOTE — Telephone Encounter (Signed)
Spoke with patient informed her of her appts on July 25th for her MRI of the brian and Abdomen. The MRI of the Aaron Edelman is at 3:00 with a 2:30 check in. The MRI of the Abdomen is at 4:00 with NPO 4hrs before test. Patient understood. Salvatore Marvel, CMA

## 2021-06-05 ENCOUNTER — Ambulatory Visit (HOSPITAL_COMMUNITY)
Admission: RE | Admit: 2021-06-05 | Discharge: 2021-06-05 | Disposition: A | Discharge status: Home / Self Care | Payer: 59 | Source: Ambulatory Visit | Attending: Family Medicine | Admitting: Family Medicine

## 2021-06-05 ENCOUNTER — Other Ambulatory Visit: Payer: Self-pay

## 2021-06-05 ENCOUNTER — Encounter (HOSPITAL_COMMUNITY): Payer: Self-pay

## 2021-06-05 ENCOUNTER — Telehealth: Payer: Self-pay | Admitting: Family Medicine

## 2021-06-05 ENCOUNTER — Ambulatory Visit (HOSPITAL_COMMUNITY)
Admission: RE | Admit: 2021-06-05 | Discharge: 2021-06-05 | Disposition: A | Discharge status: Home / Self Care | Payer: 59 | Source: Ambulatory Visit | Attending: Neurology | Admitting: Neurology

## 2021-06-05 DIAGNOSIS — R1909 Other intra-abdominal and pelvic swelling, mass and lump: Secondary | ICD-10-CM | POA: Diagnosis present

## 2021-06-05 DIAGNOSIS — R1900 Intra-abdominal and pelvic swelling, mass and lump, unspecified site: Secondary | ICD-10-CM

## 2021-06-05 DIAGNOSIS — R569 Unspecified convulsions: Secondary | ICD-10-CM | POA: Insufficient documentation

## 2021-06-05 MED ORDER — GADOBUTROL 1 MMOL/ML IV SOLN
10.0000 mL | Freq: Once | INTRAVENOUS | Status: AC | PRN
  Administered 2021-06-05: 10 mL via INTRAVENOUS

## 2021-06-05 NOTE — Telephone Encounter (Signed)
Marjory Lies from [facility] is calling concerning the order placed for patient. He saw what order was placed but he wanted to ask some questions concerning the area needed to be scanned.   Patient has appointment today at 3:00 and another scan at 4:00pm. He would like Dr. Gwendlyn Deutscher or someone to call him back to discuss.   Please call back at [phone] and ask to speak with Marjory Lies or Horris Latino.

## 2021-06-06 ENCOUNTER — Telehealth: Payer: Self-pay | Admitting: Neurology

## 2021-06-06 DIAGNOSIS — D496 Neoplasm of unspecified behavior of brain: Secondary | ICD-10-CM

## 2021-06-06 NOTE — Telephone Encounter (Signed)
I called the patient.  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   MRI brain 06/05/21:  IMPRESSION: Motion degraded exam.   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 This may reflect a low-grade neoplasm such as ganglioglioma. However, higher grade lesions (including glioblastoma multiforme) cannot be excluded. Neurosurgical consultation is recommended, and direct tissue sampling should be considered. A minimum of very short interval contrast-enhanced MRI follow-up (1-3 months) is recommended.   Multifocal T2/FLAIR hyperintensity within the cerebral white matter, overall mild but advanced for age. Findings are nonspecific and differential considerations include chronic small vessel ischemic disease, sequela of chronic migraine headaches, sequela of a prior infectious/inflammatory process or sequela of a demyelinating process such as multiple sclerosis, among others.   6 mm FLAIR hyperintense focus along the superior aspect of the right parotid gland, which may reflect a nonspecific mildly enlarged intraparotid lymph node or primary parotid neoplasm.

## 2021-06-07 NOTE — Telephone Encounter (Signed)
Referral sent to [facility]. P: [phone]

## 2021-06-08 NOTE — Telephone Encounter (Signed)
Order update and confirmed with radiology it is appropriate order.

## 2021-06-08 NOTE — Telephone Encounter (Signed)
Patient called stating that she was scheduled for an MRI of the abdomen but was told her visit that she would actually need it to be changed to MRI Pelvic.   Per note on 7/25, provider can call [facility] to clarify which orders are needed.  Please call back at [phone] and ask to speak with Marjory Lies or Horris Latino.  A new authorization will have to be performed if the body part is changing.  Patient is aware that we will call her once this has been done to reschedule.  Latandra Loureiro,CMA

## 2021-06-08 NOTE — Addendum Note (Signed)
Addended by: Andrena Mews T on: 06/08/2021 02:53 PM   Modules accepted: Orders

## 2021-06-19 ENCOUNTER — Ambulatory Visit (HOSPITAL_COMMUNITY): Admission: RE | Admit: 2021-06-19 | Payer: 59 | Source: Ambulatory Visit

## 2021-06-20 ENCOUNTER — Other Ambulatory Visit: Payer: Self-pay

## 2021-06-20 ENCOUNTER — Ambulatory Visit (HOSPITAL_COMMUNITY): Admission: RE | Admit: 2021-06-20 | Payer: 59 | Source: Ambulatory Visit

## 2021-06-20 ENCOUNTER — Ambulatory Visit (HOSPITAL_COMMUNITY)
Admission: RE | Admit: 2021-06-20 | Discharge: 2021-06-20 | Disposition: A | Discharge status: Home / Self Care | Payer: 59 | Source: Ambulatory Visit | Attending: Family Medicine | Admitting: Family Medicine

## 2021-06-20 DIAGNOSIS — R1909 Other intra-abdominal and pelvic swelling, mass and lump: Secondary | ICD-10-CM | POA: Diagnosis not present

## 2021-06-20 MED ORDER — GADOBUTROL 1 MMOL/ML IV SOLN
10.0000 mL | Freq: Once | INTRAVENOUS | Status: AC | PRN
  Administered 2021-06-20: 10 mL via INTRAVENOUS

## 2021-06-21 ENCOUNTER — Telehealth: Payer: Self-pay | Admitting: Family Medicine

## 2021-06-21 DIAGNOSIS — N809 Endometriosis, unspecified: Secondary | ICD-10-CM

## 2021-06-21 NOTE — Telephone Encounter (Signed)
MRI result discussed showing endometriosis.  Referral to Gyn recommended. All questions were answered and she agreed with referral.

## 2021-06-27 ENCOUNTER — Encounter: Payer: Self-pay | Admitting: Nurse Practitioner

## 2021-06-27 ENCOUNTER — Ambulatory Visit: Payer: 59 | Admitting: Nurse Practitioner

## 2021-06-27 ENCOUNTER — Other Ambulatory Visit: Payer: Self-pay

## 2021-06-27 VITALS — BP 136/84 | Ht 66.0 in | Wt 226.0 lb

## 2021-06-27 DIAGNOSIS — R102 Pelvic and perineal pain: Secondary | ICD-10-CM

## 2021-06-27 DIAGNOSIS — N946 Dysmenorrhea, unspecified: Secondary | ICD-10-CM

## 2021-06-27 DIAGNOSIS — R229 Localized swelling, mass and lump, unspecified: Secondary | ICD-10-CM

## 2021-06-27 DIAGNOSIS — R1900 Intra-abdominal and pelvic swelling, mass and lump, unspecified site: Secondary | ICD-10-CM

## 2021-06-27 MED ORDER — TRAMADOL HCL 50 MG PO TABS
50.0000 mg | ORAL_TABLET | Freq: Four times a day (QID) | ORAL | 0 refills | Status: DC | PRN
Start: 2021-06-27 — End: 2021-07-24

## 2021-06-27 MED ORDER — NORETHINDRONE 0.35 MG PO TABS: 1.0000 | ORAL_TABLET | Freq: Every day | ORAL | 11 refills | Status: DC

## 2021-06-27 NOTE — Progress Notes (Signed)
   Acute Office Visit  Subjective:    Patient ID: Diane Harvey, female    DOB: 01-31-1981, 40 y.o.   MRN: [id number]   HPI 40 y.o. presents as new patient for pelvic mass. Pelvic MRI 06/20/2021 showed 3.8 cm solid mass in deep subcutaneous tissues of left suprapubic abdominal wall, with a few small satellite nodules along anterior aspect of larger mass, suspicious for abdominal wall endometriosis. She has experienced pain for about a year and a half in that area and mass was first identified on exam 12/2019 by PCP. CT recommended at that time but was not done by patient. Pain is intermittent, sharp, and much worse during menses. She describes the pain during menses as labor pains. 2016 cesarean. BTL. Monthly cycles. LMP 06/12/2021. She has been taking Ibuprofen 600 mg every 8 hours for pain with little relief. Today she has been hurting since 3 am.    Review of Systems  Constitutional: Negative.   Gastrointestinal:  Positive for abdominal pain.  Genitourinary:  Positive for menstrual problem (dysmenorrhea).      Objective:    Physical Exam Constitutional:      Appearance: Normal appearance. She is obese.  Abdominal:     Palpations: There is mass.    Genitourinary:    General: Normal vulva.     Vagina: Normal.     Cervix: Normal.     Uterus: Normal.     BP 136/84   Ht '5\' 6"'$  (1.676 m)   Wt 226 lb (102.5 kg)   LMP 06/12/2021   BMI 36.48 kg/m  Wt Readings from Last 3 Encounters:  06/27/21 226 lb (102.5 kg)  05/12/21 230 lb (104.3 kg)  01/10/21 228 lb (103.4 kg)        Assessment & Plan:   Problem List Items Addressed This Visit   None Visit Diagnoses     Pelvic mass    -  Primary   Dysmenorrhea       Relevant Medications   norethindrone (ORTHO MICRONOR) 0.35 MG tablet   Acute pain in female pelvis       Relevant Medications   traMADol (ULTRAM) 50 MG tablet   Mass of subcutaneous tissue          Plan: Ultrasound guided biopsy recommended for further evaluation of  possible endometriosis versus other etiology. We discussed option to start progestin-only contraception for menstrual suppression to control dysmenorrhea and she is agreeable. Tramadol 50 mg every 6 hours as needed. She is aware this is a narcotic and to use sparingly and to avoid driving while taking. She is agreeable to plan.      [place], 4:52 PM 06/27/2021

## 2021-06-28 ENCOUNTER — Telehealth: Payer: Self-pay | Admitting: *Deleted

## 2021-06-28 ENCOUNTER — Encounter (HOSPITAL_COMMUNITY): Payer: Self-pay | Admitting: Radiology

## 2021-06-28 NOTE — Telephone Encounter (Signed)
-----   Message from Tamela Gammon, NP sent at 06/27/2021  4:52 PM EDT ----- Please schedule ultrasound guided biopsy of subcutaneous mass of abdomen/pelvis.

## 2021-06-28 NOTE — Progress Notes (Signed)
Patient Name  Juridia, Nambo Legal Sex  Female DOB  1981-03-04 SSN  [id number] Address  [address]  [place] [address] Phone  [phone] [facility])  [phone] (Mobile) *Preferred*    RE: CT Biopsy Received: Today Greggory Keen, MD  Jillyn Hidden Ok for [place] core bx abd wall mass LLQ    See MR pelvis 06/20/21   TS        Previous Messages   ----- Message -----  From: Garth Bigness D  Sent: 06/28/2021   9:57 AM EDT  To: Ir Procedure Requests  Subject: CT Biopsy                                       Procedure:   CT Biopsy   Reason:  Pelvic mass, Mass of subcutaneous tissue, pelvic mass of subcutaneous tissue, 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

## 2021-06-28 NOTE — Telephone Encounter (Signed)
[facility] centralized scheduling called patient and she is scheduled on 07/07/21 @ 1:00pm

## 2021-06-29 ENCOUNTER — Other Ambulatory Visit: Payer: Self-pay | Admitting: Family Medicine

## 2021-07-06 ENCOUNTER — Other Ambulatory Visit: Payer: Self-pay | Admitting: Student

## 2021-07-07 ENCOUNTER — Encounter (HOSPITAL_COMMUNITY): Payer: Self-pay

## 2021-07-07 ENCOUNTER — Ambulatory Visit (HOSPITAL_COMMUNITY)
Admission: RE | Admit: 2021-07-07 | Discharge: 2021-07-07 | Disposition: A | Discharge status: Home / Self Care | Payer: 59 | Source: Ambulatory Visit | Attending: Nurse Practitioner | Admitting: Nurse Practitioner

## 2021-07-07 ENCOUNTER — Other Ambulatory Visit: Payer: Self-pay

## 2021-07-07 DIAGNOSIS — R229 Localized swelling, mass and lump, unspecified: Secondary | ICD-10-CM

## 2021-07-07 DIAGNOSIS — N809 Endometriosis, unspecified: Secondary | ICD-10-CM | POA: Diagnosis not present

## 2021-07-07 DIAGNOSIS — M1990 Unspecified osteoarthritis, unspecified site: Secondary | ICD-10-CM | POA: Diagnosis not present

## 2021-07-07 DIAGNOSIS — Z793 Long term (current) use of hormonal contraceptives: Secondary | ICD-10-CM | POA: Diagnosis not present

## 2021-07-07 DIAGNOSIS — Z8349 Family history of other endocrine, nutritional and metabolic diseases: Secondary | ICD-10-CM | POA: Diagnosis not present

## 2021-07-07 DIAGNOSIS — D649 Anemia, unspecified: Secondary | ICD-10-CM | POA: Insufficient documentation

## 2021-07-07 DIAGNOSIS — R102 Pelvic and perineal pain: Secondary | ICD-10-CM | POA: Diagnosis not present

## 2021-07-07 DIAGNOSIS — G8929 Other chronic pain: Secondary | ICD-10-CM | POA: Diagnosis not present

## 2021-07-07 DIAGNOSIS — R569 Unspecified convulsions: Secondary | ICD-10-CM | POA: Diagnosis not present

## 2021-07-07 DIAGNOSIS — G939 Disorder of brain, unspecified: Secondary | ICD-10-CM | POA: Insufficient documentation

## 2021-07-07 DIAGNOSIS — Z79899 Other long term (current) drug therapy: Secondary | ICD-10-CM | POA: Diagnosis not present

## 2021-07-07 DIAGNOSIS — R1900 Intra-abdominal and pelvic swelling, mass and lump, unspecified site: Secondary | ICD-10-CM | POA: Diagnosis present

## 2021-07-07 DIAGNOSIS — Z9851 Tubal ligation status: Secondary | ICD-10-CM | POA: Insufficient documentation

## 2021-07-07 DIAGNOSIS — M549 Dorsalgia, unspecified: Secondary | ICD-10-CM | POA: Insufficient documentation

## 2021-07-07 MED ORDER — LIDOCAINE-EPINEPHRINE (PF) 2 %-1:200000 IJ SOLN
INTRAMUSCULAR | Status: AC
  Filled 2021-07-07: qty 20

## 2021-07-07 MED ORDER — LIDOCAINE-EPINEPHRINE 1 %-1:100000 IJ SOLN
INTRAMUSCULAR | Status: AC | PRN
  Administered 2021-07-07: 10 mL

## 2021-07-07 MED ORDER — OXYCODONE-ACETAMINOPHEN 5-325 MG PO TABS
1.0000 | ORAL_TABLET | Freq: Once | ORAL | Status: DC
Start: 2021-07-07 — End: 2021-07-08
  Filled 2021-07-07: qty 1

## 2021-07-07 MED ORDER — MIDAZOLAM HCL 2 MG/2ML IJ SOLN
INTRAMUSCULAR | Status: AC | PRN
  Administered 2021-07-07 (×2): 1 mg via INTRAVENOUS

## 2021-07-07 MED ORDER — FENTANYL CITRATE (PF) 100 MCG/2ML IJ SOLN
INTRAMUSCULAR | Status: AC
  Filled 2021-07-07: qty 2

## 2021-07-07 MED ORDER — FENTANYL CITRATE (PF) 100 MCG/2ML IJ SOLN
INTRAMUSCULAR | Status: AC | PRN
  Administered 2021-07-07 (×2): 50 ug via INTRAVENOUS

## 2021-07-07 MED ORDER — MIDAZOLAM HCL 2 MG/2ML IJ SOLN
INTRAMUSCULAR | Status: AC
  Filled 2021-07-07: qty 4

## 2021-07-07 MED ORDER — SODIUM CHLORIDE 0.9 % IV SOLN: INTRAVENOUS | Status: DC

## 2021-07-07 NOTE — Progress Notes (Signed)
Chief Complaint: Patient was seen in consultation today for pelvic mass biopsy  at the request of Diane,Tiffany Harvey  Referring Physician(s): Diane Harvey  Supervising Physician: Jacqulynn Diane Harvey  Patient Status: [facility] - Out-pt  History of Present Illness: Diane Harvey is Harvey 40 y.o. female hx of anemia, arthritis, back pain, brain lesion and seizures. Pt saw her PCP 01/05/20 c/o persistent pelvic pain after her tubal ligation 2 years prior. PCP reports Harvey palpable mass at that time. CT was ordered but pt did not follow-up at that time. Pt saw PCP again 05/12/21 w/ c/o persisting pelvic pain. PC ordered US/MRI. MRI 06/21/21 showed endometriosis. Pt is here today referred from Diane Lowenstein, NP gynecology, for pelvic mass biopsy.   Pt observed to be sitting upright in bed. She is calm, pleasant and Harvey&O.  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  Unknown name.  Has tried injections with good results.  Plans on returning.  No current problems as of 03/17/18   New onset seizure ([place]) 01/10/2021   Seizures ([place]) 01/04/2021   Supervision of other normal pregnancy 04/07/2015    Clinic/provider [facility] Cordelia [facility]) Prenatal Labs  Dating LMP and 21w u/s Blood type: O/POS/-- (02/16 1350)   Genetic Screen declined Antibody:NEG (02/16 1350)  Anatomic [place] HC<3rd %ile at 25w Rubella: 2.16 (02/16 1350)  GTT Early: 125               Third trimester: 3 hour normal RPR: NON REAC (05/26 0909)   Flu vaccine Off season HBsAg: NEGATIVE (02/16 1350)   TDaP vaccine 04/07/15                                              Rhogam: not indicated HIV: NONREACTIVE (05/26 0909)   GBS       Negative                                (For PCN allergy, check sensitivities) GBS:NOT DETECTED (07/01 1440)  Contraception BTL - consent signed 04/07/15; Changed mind and wants Depo Pap: 09/26/12 - negative w/ +HR HPV, needs repeat pap postpartum  Baby Food Bottle   Circumcision N/Harvey   Pediatrician [facility]        SVD (spontaneous vaginal delivery)    x 3    Past Surgical History:  Procedure Laterality Date   CARPAL TUNNEL RELEASE Bilateral    2 separate surgeries   CESAREAN SECTION N/Harvey 06/09/2015   Procedure: CESAREAN SECTION;  Surgeon: Diane Oman, MD;  Location: [facility];  Service: Obstetrics;  Laterality: N/Harvey;   LAPAROSCOPIC TUBAL LIGATION Bilateral 03/25/2018   Procedure: LAPAROSCOPIC TUBAL LIGATION;  Surgeon: Diane Filbert, MD;  Location: [facility];  Service: Gynecology;  Laterality: Bilateral;   WISDOM TOOTH EXTRACTION      Allergies: Patient has no known allergies.  Medications: Prior to Admission medications   Medication Sig Start Date End Date Taking? Authorizing Provider  ibuprofen (ADVIL) 400 MG tablet Take 1 tablet (400 mg total) by mouth every 8 (eight) hours as needed. 06/29/21   Diane Feil, MD  levETIRAcetam (KEPPRA) 500 MG tablet Take 1 tablet (500 mg total) by mouth 2 (two) times daily. 05/16/21   Diane Ducking, MD  norethindrone (ORTHO MICRONOR) 0.35 MG tablet Take 1 tablet (0.35 mg total) by mouth daily. 06/27/21   Diane Gammon, NP  traMADol (ULTRAM) 50 MG tablet Take 1 tablet (50 mg total) by mouth every 6 (six) hours as needed. 06/27/21   Diane Gammon, NP     Family History  Problem Relation Age of Onset   Hypertension Mother    Hypertension Father    Diabetes Father    Hyperlipidemia Father    Cancer Paternal Grandfather        throat?   Heart disease Neg Hx    Stroke Neg Hx     Social History   Socioeconomic History   Marital status: Single    Spouse name: Diane Harvey   Number of children: Not on file   Years of  education: Not on file   Highest education level: Not on file  Occupational History    Comment: full time   Tobacco Use   Smoking status: Never   Smokeless tobacco: Never  Vaping Use   Vaping Use: Never used  Substance and Sexual Activity   Alcohol use: No   Drug use: No   Sexual activity: Yes    Birth control/protection: Surgical    Comment: BTL  Other Topics Concern   Not on file  Social History Narrative   Lives with husband and 3 children   Right Handed   Drinks around 10 cups caffeine daily   Social Determinants of Health   Financial Resource Strain: Not on file  Food Insecurity: Not on file  Transportation Needs: Not on file  Physical Activity: Not on file  Stress: Not on file  Social Connections: Not on file      Review of Systems: Harvey 12 point ROS discussed and pertinent positives are indicated in the HPI above.  All other systems are negative.  Review of Systems  Constitutional:  Negative for chills and fever.  Respiratory:  Negative for cough and shortness of breath.   Cardiovascular:  Negative for chest pain and leg swelling.  Gastrointestinal:  Negative for abdominal pain, blood in stool, nausea and vomiting.   Vital Signs: LMP 06/12/2021   Physical Exam Vitals reviewed.  Constitutional:      Appearance: Normal appearance. She is not ill-appearing.  HENT:     Head: Normocephalic and atraumatic.     Mouth/Throat:     Mouth: Mucous membranes are moist.     Pharynx: Oropharynx is clear.  Cardiovascular:     Rate and Rhythm: Normal rate and regular rhythm.     Pulses: Normal pulses.     Heart sounds: Normal heart sounds. No murmur heard. Pulmonary:     Effort: Pulmonary effort is normal.     Breath sounds: No stridor. No wheezing, rhonchi or rales.  Abdominal:     General: There is no distension.     Palpations: Abdomen is soft. There is mass.     Tenderness: There is abdominal tenderness. There is no guarding.     Comments: Tenderness in the  area of superficial suprapubic mass  Musculoskeletal:  Right lower leg: No edema.     Left lower leg: No edema.  Skin:    General: Skin is warm and dry.  Neurological:     Mental Status: She is alert.  Psychiatric:        Mood and Affect: Mood normal.        Behavior: Behavior normal.        Thought Content: Thought content normal.        Judgment: Judgment normal.    Imaging: MR Pelvis W Wo Contrast  Result Date: 06/21/2021 CLINICAL DATA:  Palpable suprapubic pelvic mass. EXAM: MRI PELVIS WITHOUT AND WITH CONTRAST TECHNIQUE: Multiplanar multisequence MR imaging of the pelvis was performed both before and after administration of intravenous contrast. CONTRAST:  73m GADAVIST GADOBUTROL 1 MMOL/ML IV SOLN COMPARISON:  Ultrasound on 05/23/2021 FINDINGS: Lower Urinary Tract: No urinary bladder or urethral abnormality identified. Bowel: Unremarkable pelvic bowel loops. Vascular/Lymphatic: Unremarkable. No pathologically enlarged pelvic lymph nodes identified. Reproductive: -- Uterus: Measures 10.0 x 4.8 by 5.9 cm. No fibroids or other masses identified. Prior C-section scar noted. Cervix and vagina are unremarkable. -- Right ovary: Appears normal. No ovarian or adnexal masses identified. -- Left ovary: Appears normal. No ovarian or adnexal masses identified. Other: No peritoneal thickening or abnormal free fluid. Musculoskeletal: An irregular solid enhancing mass is seen in the deep subcutaneous tissues of the left suprapubic anterior abdominal wall at the site of Harvey prior surgical incision, which measures 3.8 x 3.5 cm. Two tiny less than 1 cm enhancing satellite nodules are also seen along the anterior aspect of this larger mass. These findings are highly suspicious for abdominal wall endometriosis, with other soft tissue neoplasm considered less likely. IMPRESSION: 3.8 cm solid enhancing mass in the deep subcutaneous tissues of the left suprapubic abdominal wall, with Harvey few tiny satellite nodules  along the anterior aspect of this larger mass. These findings are highly suspicious for abdominal wall endometriosis, with other soft tissue neoplasm considered less likely. Normal appearance of uterus and both ovaries. Prior C-section scar noted. Electronically Signed   By: JMarlaine HindM.D.   On: 06/21/2021 08:35    Labs:  CBC: Recent Labs    01/05/21 0045  WBC 9.1  HGB 12.9  HCT 39.3  PLT 378    COAGS: No results for input(s): INR, APTT in the last 8760 hours.  BMP: Recent Labs    01/05/21 0045  NA 135  K 2.8*  CL 101  CO2 22  GLUCOSE 135*  BUN 7  CALCIUM 9.1  CREATININE 0.81  GFRNONAA >60    LIVER FUNCTION TESTS: Recent Labs    01/05/21 0045  BILITOT 0.9  AST 20  ALT 11  ALKPHOS 49  PROT 7.6  ALBUMIN 3.5    TUMOR MARKERS: No results for input(s): AFPTM, CEA, CA199, CHROMGRNA in the last 8760 hours.  Assessment and Plan:   hx of anemia, arthritis, back pain, brain lesion and seizures. Pt saw her PCP 01/05/20 c/o persistent pelvic pain after her tubal ligation 2 years prior. PCP reports Harvey palpable mass at that time. CT was ordered but pt did not follow-up at that time. Pt saw PCP again 05/12/21 w/ c/o persisting pelvic pain. PC ordered US/MRI. MRI 06/21/21 showed endometriosis. Pt is here today referred from TMarny Lowenstein NP gynecology, for pelvic mass biopsy.   Pt observed to be sitting upright in bed. She is calm, pleasant and Harvey&O.  She confirms NPO status per order She denies thinners She  is in NAD No labs ordered today  Risks and benefits of pelvic mass biopsy was discussed with the patient and/or patient's family including, but not limited to bleeding, infection, damage to adjacent structures or low yield requiring additional tests.  All of the questions were answered and there is agreement to proceed.  Consent signed and in chart.   Thank you for this interesting consult.  I greatly enjoyed meeting LOWEN FELLAND and look forward to participating in  their care.  Harvey copy of this report was sent to the requesting provider on this date.  Electronically Signed: Tyson Alias, NP 07/07/2021, 9:17 AM   I spent Harvey total of 30 minutes in face to face in clinical consultation, greater than 50% of which was counseling/coordinating care for pelvic mass biopsy.

## 2021-07-07 NOTE — Procedures (Signed)
Interventional Radiology Procedure Note  Procedure: US guided core biopsy os suspected abdominal wall endometrioma.   Complications: None  Estimated Blood Loss: None  Recommendations: - Bedrest x 1 yr - DC home    Signed,  Criselda Peaches, MD

## 2021-07-10 ENCOUNTER — Other Ambulatory Visit: Payer: Self-pay

## 2021-07-10 ENCOUNTER — Ambulatory Visit (INDEPENDENT_AMBULATORY_CARE_PROVIDER_SITE_OTHER): Payer: 59

## 2021-07-10 DIAGNOSIS — Z111 Encounter for screening for respiratory tuberculosis: Secondary | ICD-10-CM

## 2021-07-10 LAB — SURGICAL PATHOLOGY

## 2021-07-10 NOTE — Progress Notes (Signed)
Patient is here for a PPD placement.  PPD placed in left forearm @ 9:10 am.  Patient will return 07/12/2021 to have PPD read.   Talbot Grumbling, RN

## 2021-07-12 ENCOUNTER — Ambulatory Visit (INDEPENDENT_AMBULATORY_CARE_PROVIDER_SITE_OTHER): Payer: 59

## 2021-07-12 ENCOUNTER — Other Ambulatory Visit: Payer: Self-pay

## 2021-07-12 DIAGNOSIS — Z111 Encounter for screening for respiratory tuberculosis: Secondary | ICD-10-CM

## 2021-07-12 LAB — TB SKIN TEST
Induration: 0 mm
TB Skin Test: NEGATIVE

## 2021-07-12 NOTE — Progress Notes (Signed)
Patient is here for a PPD read.  It was placed on 07/10/2021 in the left forearm @ 9:10 am.    PPD RESULTS:  Result: negative Induration: 0 mm  Letter created and given to patient for documentation purposes. Talbot Grumbling, RN

## 2021-07-13 ENCOUNTER — Ambulatory Visit: Payer: Medicaid Other | Admitting: Neurology

## 2021-07-18 ENCOUNTER — Telehealth: Payer: Self-pay

## 2021-07-18 NOTE — Telephone Encounter (Signed)
Staff message sent to the appointment schedulers at [facility] to arrange appt and let us know date/time.

## 2021-07-18 NOTE — Telephone Encounter (Signed)
-----   Message from Tamela Gammon, NP sent at 07/18/2021  7:40 AM EDT ----- Please send referral to general surgery for pelvic mass. Thank you.

## 2021-07-18 NOTE — Telephone Encounter (Signed)
Jaleeya Saturday called back from [facility] she has reviewed the chart with RN and they cannot see the patient for this. I asked Waynisha (referral coordinator) if the RN gave any recommendations about who the patient should see and she did not. Sagen said if you want to speak with RN you may call Sameen at [phone] and she can get the RN to the phone.

## 2021-07-19 NOTE — Telephone Encounter (Signed)
[facility] called and Dr. Rosendo Gros is willing to see patient in consultation. She is scheduled for 07/26/21 at 8:40am.  They have informed patient.  She said patient was not sure why she was seeing him and said she had not received biopsy results. Tiffany had sent her a My Chart message early yesterday morning informing her. I called her and explained this and that it did show endometriosis and this appointment with Dr. Rosendo Gros is the next step.

## 2021-07-19 NOTE — Telephone Encounter (Signed)
I called [facility]. Explained that the mass is in her abdominal wall, by my review of her MRI it appears to be involving her fascia. I'm concerned she may need to have some fascia resected and don't know if she will need mesh. Request consultation with General Surgeon.

## 2021-07-24 ENCOUNTER — Other Ambulatory Visit: Payer: Self-pay

## 2021-07-24 ENCOUNTER — Encounter: Payer: Self-pay | Admitting: Neurology

## 2021-07-24 ENCOUNTER — Ambulatory Visit: Payer: 59 | Admitting: Neurology

## 2021-07-24 VITALS — BP 132/96 | HR 67 | Ht 67.0 in | Wt 226.0 lb

## 2021-07-24 DIAGNOSIS — G40909 Epilepsy, unspecified, not intractable, without status epilepticus: Secondary | ICD-10-CM

## 2021-07-24 DIAGNOSIS — G9389 Other specified disorders of brain: Secondary | ICD-10-CM | POA: Diagnosis not present

## 2021-07-24 HISTORY — DX: Other specified disorders of brain: G93.89

## 2021-07-24 MED ORDER — LEVETIRACETAM 750 MG PO TABS: 750.0000 mg | ORAL_TABLET | Freq: Two times a day (BID) | ORAL | 1 refills | Status: DC

## 2021-07-24 NOTE — Patient Instructions (Signed)
Stop the ultram (tramadol) and increase the Keppra to 750 mg twice a day.

## 2021-07-24 NOTE — Progress Notes (Signed)
Reason for visit: Seizures, brain tumor  Diane Harvey is an 40 y.o. female  History of present illness:  Diane. Harvey is a 40 year old right-handed black female with a history of seizure events.  Her first seizure occurred out of sleep with a generalized event.  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  The patient has been referred to neurosurgery, but [facility] did not accept her insurance.  She is trying to find out through her insurance company what network may be covered.  The patient is being treated for a pelvic mass which is causing pain, she was placed on Ultram in August 2022.  Since that time, her husband is noted 3 staring episodes lasting 2 to 3 minutes.  The patient has no recollection of these events, but the patient would not respond to her husband even though he was shaking her.  The patient remains on Keppra 500 mg twice daily, she tolerates the medication fairly well.  She is not operating a motor vehicle.  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  Unknown name.  Has tried injections with good results.  Plans on returning.  No current problems as of 03/17/18   New onset seizure ([place]) 01/10/2021   Seizures ([place]) 01/04/2021   Supervision of other normal pregnancy 04/07/2015    Clinic/provider [facility]) Prenatal Labs  Dating LMP and 21w u/s Blood type: O/POS/-- (02/16 1350)   Genetic Screen declined Antibody:NEG (02/16 1350)  Anatomic [place] HC<3rd %ile at 25w Rubella: 2.16 (02/16 1350)  GTT Early: 125               Third trimester: 3 hour normal RPR: NON REAC (05/26 0909)    Flu vaccine Off season HBsAg: NEGATIVE (02/16 1350)   TDaP vaccine 04/07/15                                              Rhogam: not indicated HIV: NONREACTIVE (05/26 0909)   GBS      Negative                                (For PCN allergy, check sensitivities) GBS:NOT DETECTED (07/01 1440)  Contraception BTL - consent signed 04/07/15; Changed mind and wants Depo Pap: 09/26/12 - negative w/ +HR HPV, needs repeat pap postpartum  Baby Food Bottle   Circumcision N/a   Pediatrician [facility]        SVD (spontaneous vaginal delivery)    x 3    Past Surgical History:  Procedure Laterality Date   CARPAL TUNNEL RELEASE Bilateral    2 separate surgeries   CESAREAN SECTION N/A 06/09/2015   Procedure: CESAREAN SECTION;  Surgeon: Osborne Oman, MD;  Location: [facility];  Service: Obstetrics;  Laterality: N/A;   LAPAROSCOPIC  TUBAL LIGATION Bilateral 03/25/2018   Procedure: LAPAROSCOPIC TUBAL LIGATION;  Surgeon: Emily Filbert, MD;  Location: [facility];  Service: Gynecology;  Laterality: Bilateral;   WISDOM TOOTH EXTRACTION      Family History  Problem Relation Age of Onset   Hypertension Mother    Hypertension Father    Diabetes Father    Hyperlipidemia Father    Cancer Paternal Grandfather        throat?   Heart disease Neg Hx    Stroke Neg Hx     Social history:  reports that she has never smoked. She has never used smokeless tobacco. She reports that she does not drink alcohol and does not use drugs.   No Known Allergies  Medications:  Prior to Admission medications   Medication Sig Start Date End Date Taking? Authorizing Provider  ibuprofen (ADVIL) 400 MG tablet Take 1 tablet (400 mg total) by mouth every 8 (eight) hours as needed. 06/29/21   Kinnie Feil, MD  levETIRAcetam (KEPPRA) 500 MG tablet Take 1 tablet (500 mg total) by mouth 2 (two) times daily. 05/16/21   Kathrynn Ducking, MD  norethindrone (ORTHO MICRONOR) 0.35 MG tablet Take 1 tablet (0.35 mg total) by  mouth daily. 06/27/21   Tamela Gammon, NP  traMADol (ULTRAM) 50 MG tablet Take 1 tablet (50 mg total) by mouth every 6 (six) hours as needed. 06/27/21   Tamela Gammon, NP    ROS:  Out of a complete 14 system review of symptoms, the patient complains only of the following symptoms, and all other reviewed systems are negative.  Seizure events Abdominal pain  Blood pressure (!) 132/96, pulse 67, height '5\' 7"'$  (1.702 m), weight 226 lb (102.5 kg), last menstrual period 07/04/2021.  Physical Exam  General: The patient is alert and cooperative at the time of the examination.  The patient is mildly obese.  Skin: No significant peripheral edema is noted.   Neurologic Exam  Mental status: The patient is alert and oriented x 3 at the time of the examination. The patient has apparent normal recent and remote memory, with an apparently normal attention span and concentration ability.   Cranial nerves: Facial symmetry is present. Speech is normal, no aphasia or dysarthria is noted. Extraocular movements are full. Visual fields are full.  Motor: The patient has good strength in all 4 extremities.  Sensory examination: Soft touch sensation is symmetric on the face, arms, and legs.  Coordination: The patient has good finger-nose-finger and heel-to-shin bilaterally.  Gait and station: The patient has a normal gait. Tandem gait is normal. Romberg is negative. No drift is seen.  Reflexes: Deep tendon reflexes are symmetric.   MRI brain 06/05/21:   IMPRESSION: Motion degraded exam.   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 This may reflect a low-grade neoplasm such as ganglioglioma. However, higher grade lesions (including glioblastoma multiforme) cannot be excluded. Neurosurgical consultation is recommended, and direct tissue sampling should be considered. A minimum of very short interval contrast-enhanced MRI follow-up (1-3  months) is recommended.   Multifocal T2/FLAIR hyperintensity within the cerebral white matter, overall mild but advanced for age. Findings are nonspecific and differential considerations include chronic small vessel ischemic disease, sequela of chronic migraine headaches, sequela of a prior infectious/inflammatory process or sequela of a demyelinating process such as multiple sclerosis, among others.   6 mm FLAIR hyperintense focus along the superior aspect of the right parotid gland, which may reflect  a nonspecific mildly enlarged intraparotid lymph node or primary parotid neoplasm.  * MRI scan images were reviewed online. I agree with the written report.    Assessment/Plan:  1.  Seizures  2.  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  The patient will go up on the [place] taking 750 mg twice daily and she will stop the Ultram.  The patient is not to operate a motor vehicle until further notice.  She will follow-up in 4 months, in the future she can be followed through Dr. April Manson.  If the mass is resected in the future, she could potentially have surgically corrected epilepsy and be able to come off medications in the future.  Jill Alexanders MD 07/24/2021 2:55 PM  [facility] [address] [place], [place] [address]  Phone [phone] Fax [phone]

## 2021-07-31 ENCOUNTER — Telehealth: Payer: Self-pay | Admitting: *Deleted

## 2021-07-31 NOTE — Telephone Encounter (Signed)
Patient was seen on 06/27/21 and prescribed ultram for pain. Patient called today stating her Neurologist Dr.Willis told her to stop the ultram( notes in epic). Patient asked if another mediation can be prescribed? Please advise

## 2021-07-31 NOTE — Telephone Encounter (Signed)
Patient informed. 

## 2021-07-31 NOTE — Telephone Encounter (Signed)
It is recommended she avoid any controlled substances due to recent events. I recommend alternating Ibuprofen 600-800 mg every 8 hours with Tylenol as needed.

## 2021-08-23 ENCOUNTER — Other Ambulatory Visit: Payer: Self-pay | Admitting: Family Medicine

## 2021-10-10 IMAGING — US US BIOPSY
1 series · 13 of 13 positions shown · non-contrast
Comparison: none

INDICATION: 40-year-old female with lower abdominal wall mass, possible
endometrioma versus malignancy.

[Series 1: us biopsy abd rp mass mc & wl · 13 acquisitions, 13 frames shown]
[im 1/13]
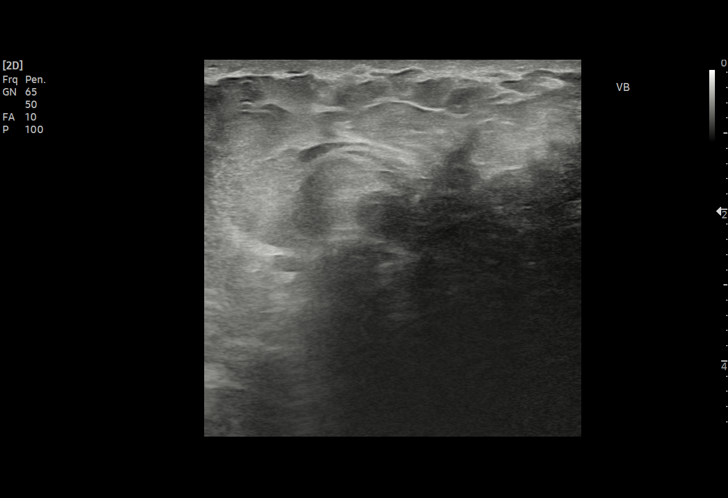
[im 2/13]
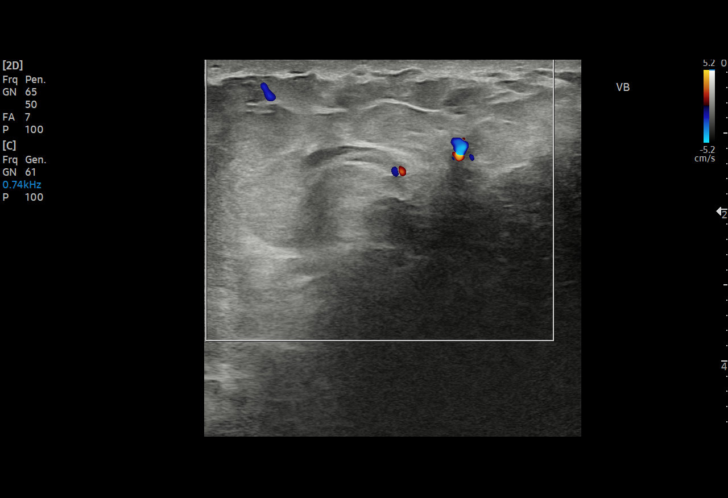
[im 3/13]
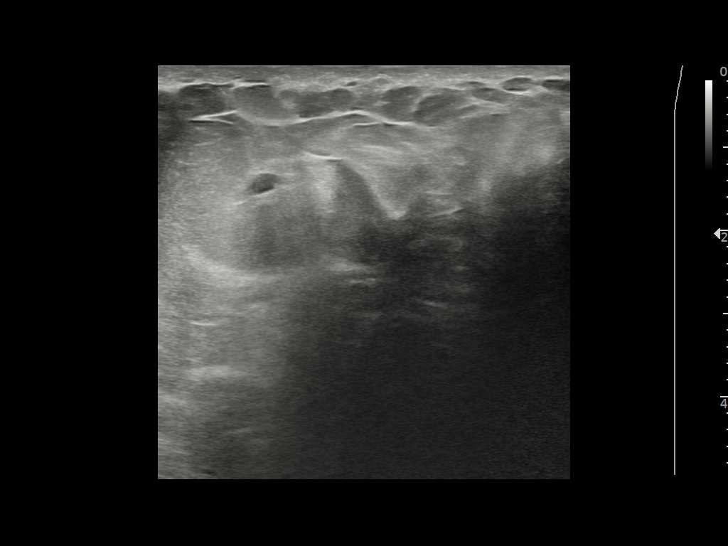
[im 4/13]
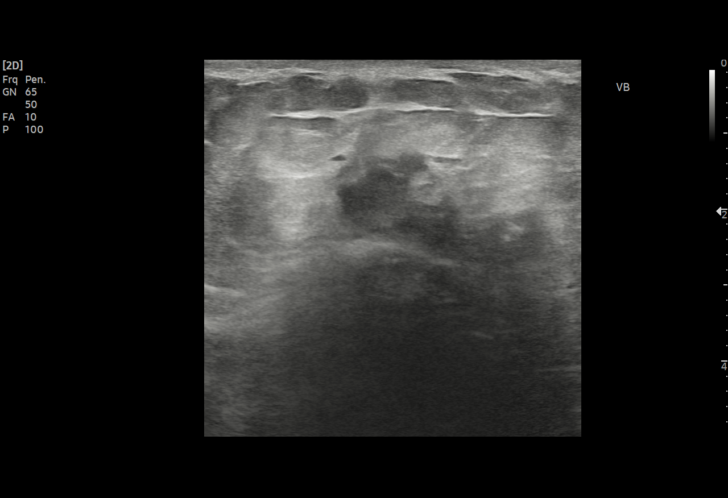
[im 5/13]
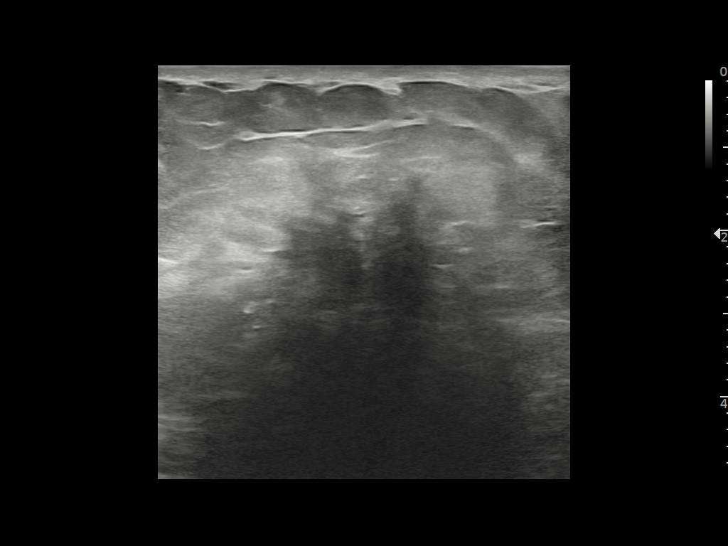
[im 6/13]
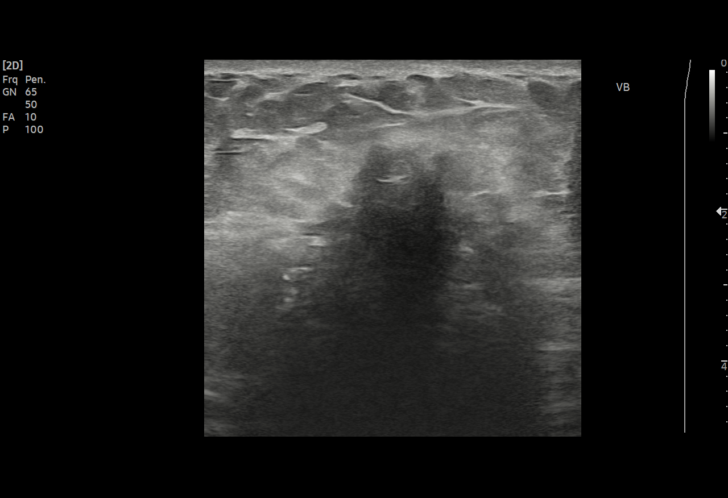
[im 7/13]
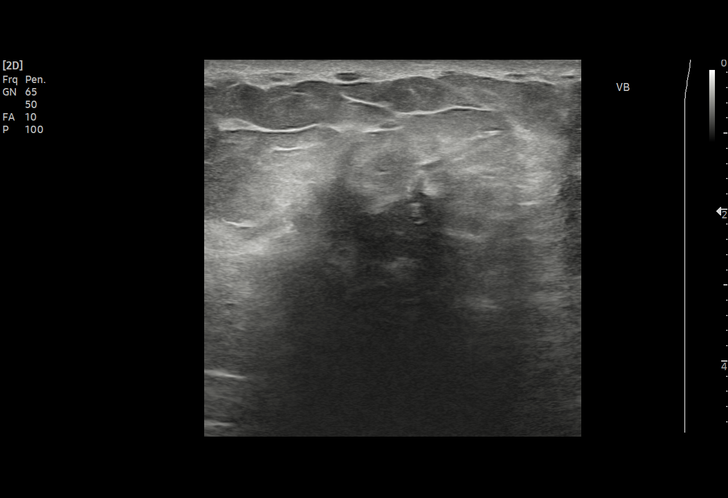
[im 8/13]
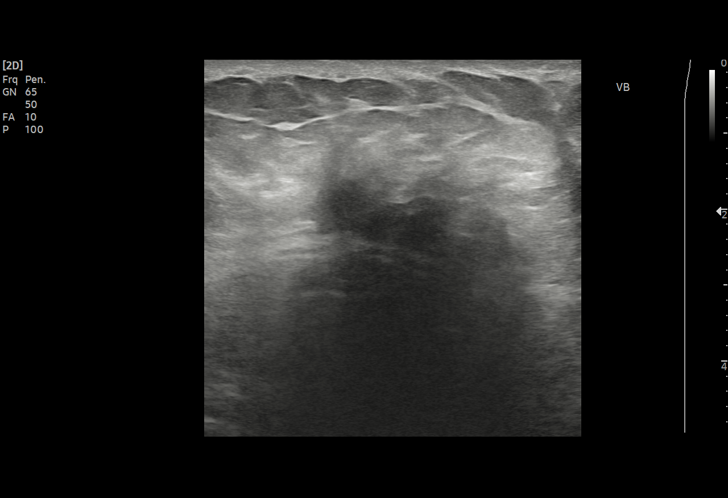
[im 9/13]
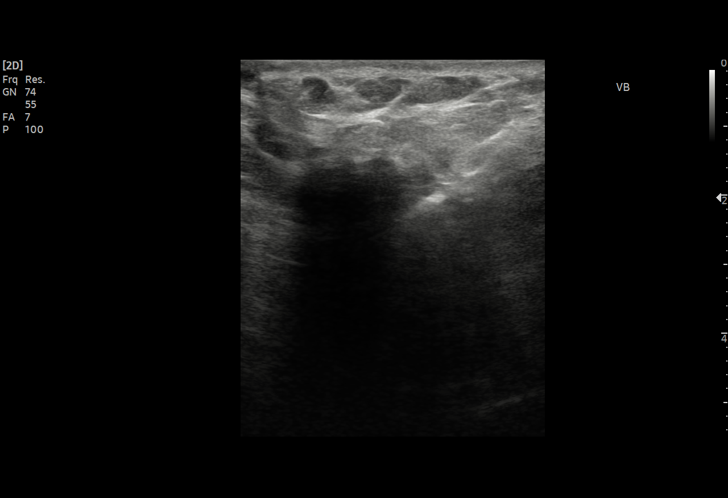
[im 10/13]
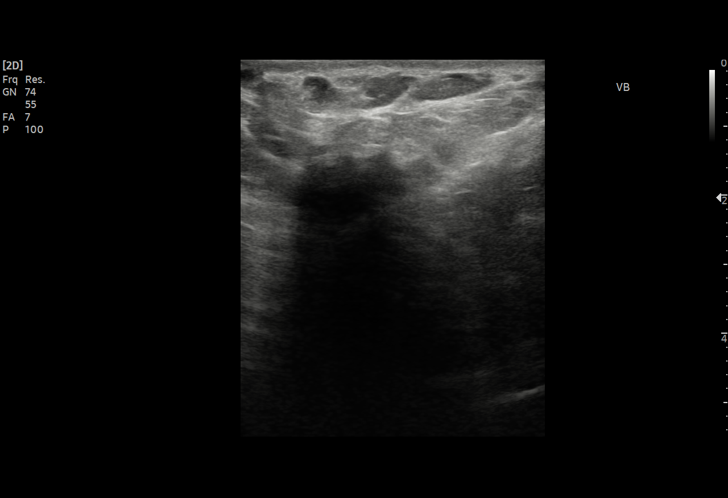
[im 11/13]
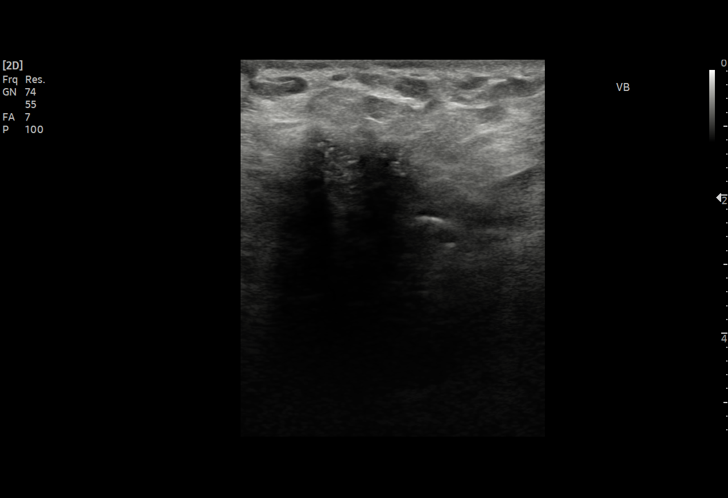
[im 12/13]
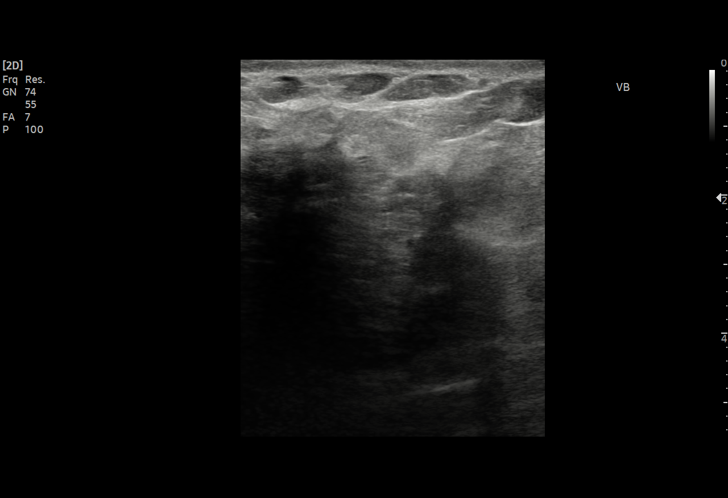
[im 13/13]
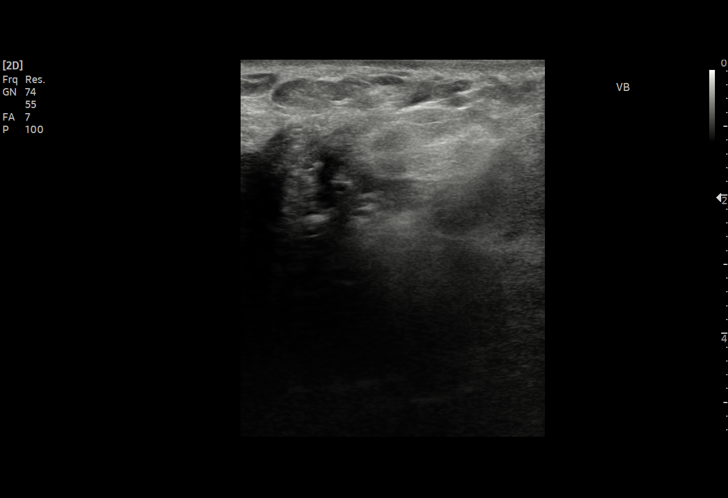

[13 of 13 positions shown; findings below may reference images not displayed]

EXAM:
Ultrasound-guided core biopsy

MEDICATIONS:
None.

ANESTHESIA/SEDATION:
Moderate (conscious) sedation was employed during this procedure. A
total of Versed 2 mg and Fentanyl 100 mcg was administered
intravenously.

Moderate Sedation Time: 11 minutes. The patient's level of
consciousness and vital signs were monitored continuously by
radiology nursing throughout the procedure under my direct
supervision.

FLUOROSCOPY TIME:  None.

COMPLICATIONS:
None immediate.

PROCEDURE:
Informed written consent was obtained from the patient after a
thorough discussion of the procedural risks, benefits and
alternatives. All questions were addressed. Maximal Sterile Barrier
Technique was utilized including caps, mask, sterile gowns, sterile
gloves, sterile drape, hand hygiene and skin antiseptic. A timeout
was performed prior to the initiation of the procedure.

Ultrasound was used to interrogate the region of interest. There is
an ill-defined highly dense soft tissue mass in the region of
clinical concern. The mass creates shadowing and is very difficult
to visualize in detail.

A skin entry site was selected and marked. The overlying skin was
sterilely prepped and draped in the standard fashion using
chlorhexidine skin prep. Local anesthesia was attained by
infiltration with 1% lidocaine. A small dermatotomy was made. Under
real-time ultrasound guidance, multiple 18 gauge core biopsies were
obtained using the [employer] Ow[employer] Christlike automated biopsy device. Biopsy
specimens were delivered to pathology for further analysis.

The patient tolerated the procedure well.
IMPRESSION: Technically successful ultrasound-guided core biopsy of lower
midline abdominal wall mass.

## 2021-10-20 ENCOUNTER — Other Ambulatory Visit: Payer: Self-pay | Admitting: Family Medicine

## 2021-10-30 ENCOUNTER — Ambulatory Visit (INDEPENDENT_AMBULATORY_CARE_PROVIDER_SITE_OTHER): Payer: 59 | Admitting: Family Medicine

## 2021-10-30 ENCOUNTER — Other Ambulatory Visit: Payer: Self-pay

## 2021-10-30 VITALS — BP 131/81 | HR 78 | Ht 67.0 in | Wt 221.4 lb

## 2021-10-30 DIAGNOSIS — L989 Disorder of the skin and subcutaneous tissue, unspecified: Secondary | ICD-10-CM

## 2021-10-30 MED ORDER — DOXYCYCLINE HYCLATE 100 MG PO TABS: 100.0000 mg | ORAL_TABLET | Freq: Two times a day (BID) | ORAL | 0 refills | Status: AC

## 2021-10-30 NOTE — Patient Instructions (Addendum)
I recommend that you schedule follow-up appointment with your primary care doctor for a Pap smear as you are now due for this.  I am sending in an antibiotic called doxycycline 3 to take for the next 5 days for the skin infection.  Should start getting better after 1 to 2 days of this medication.  If it does not or if it starts worsening please return.  You should not develop any fevers, if you do please let us know.

## 2021-10-30 NOTE — Progress Notes (Signed)
° ° °  SUBJECTIVE:   CHIEF COMPLAINT / HPI:   "Bug bite": 40 year old female presenting with concern for a possible bug bite.  She states she first noticed it on Wednesday of last week and that is slightly been increasing in size.  She noted some purulent drainage few days ago but since then has been draining some clear fluid.  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  No fevers.  PERTINENT  PMH / PSH: None relevant  OBJECTIVE:   BP 131/81    Pulse 78    Ht 5\' 7"  (1.702 m)    Wt 221 lb 6.4 oz (100.4 kg)    LMP 10/11/2021    SpO2 97%    BMI 34.68 kg/m    General: NAD, pleasant, able to participate in exam Respiratory: No respiratory distress Skin: Right upper arm with single erythematous lesion approximately half centimeter in size with firm area palpated underneath which does not seem to be fluid in nature, no obvious signs of an abscess, no purulence able to be expressed via palpation. Psych: Normal affect and mood      ASSESSMENT/PLAN:   Skin lesion: Erythematous skin lesion present since Wednesday consistent with an insect bite.  Other differentials can include a skin infection due to trauma.  There is a mild amount of erythema extending from it.  She did note some purulent drainage earlier in the course but today there is only a small amount of clear drainage present.  There is some firmness on palpation but no signs of an abscess.  Due to the patient reporting of purulence earlier we will initiate doxycycline for 5-day course to cover for MRSA in addition to normal skin flora.  Follow-up precautions discussed.   Lurline Del, [place]

## 2021-12-20 ENCOUNTER — Ambulatory Visit: Payer: 59 | Admitting: Neurology

## 2022-01-12 ENCOUNTER — Other Ambulatory Visit: Payer: Self-pay | Admitting: Family Medicine

## 2022-01-29 ENCOUNTER — Telehealth: Payer: Self-pay | Admitting: Neurology

## 2022-01-29 MED ORDER — LEVETIRACETAM 750 MG PO TABS: 750.0000 mg | ORAL_TABLET | Freq: Two times a day (BID) | ORAL | 1 refills | Status: DC

## 2022-01-29 NOTE — Telephone Encounter (Signed)
Pt is requesting a refill for levETIRAcetam (KEPPRA) 750 MG tablet. ? ?Pharmacy: [id number] ? ?

## 2022-01-29 NOTE — Telephone Encounter (Signed)
Rx refilled.

## 2022-01-29 NOTE — Addendum Note (Signed)
Addended by: Cristela Felt E on: 01/29/2022 12:42 PM ? ? Modules accepted: Orders ? ?

## 2022-02-09 ENCOUNTER — Encounter: Payer: Self-pay | Admitting: Family Medicine

## 2022-02-09 ENCOUNTER — Ambulatory Visit (INDEPENDENT_AMBULATORY_CARE_PROVIDER_SITE_OTHER): Payer: 59 | Admitting: Family Medicine

## 2022-02-09 VITALS — BP 140/90 | Ht 67.0 in | Wt 222.5 lb

## 2022-02-09 DIAGNOSIS — G40909 Epilepsy, unspecified, not intractable, without status epilepticus: Secondary | ICD-10-CM

## 2022-02-09 DIAGNOSIS — R5383 Other fatigue: Secondary | ICD-10-CM | POA: Diagnosis not present

## 2022-02-09 DIAGNOSIS — M7662 Achilles tendinitis, left leg: Secondary | ICD-10-CM

## 2022-02-09 DIAGNOSIS — M47816 Spondylosis without myelopathy or radiculopathy, lumbar region: Secondary | ICD-10-CM | POA: Diagnosis not present

## 2022-02-09 DIAGNOSIS — G9389 Other specified disorders of brain: Secondary | ICD-10-CM

## 2022-02-09 MED ORDER — BACLOFEN 10 MG PO TABS: 10.0000 mg | ORAL_TABLET | Freq: Two times a day (BID) | ORAL | 1 refills | Status: DC | PRN

## 2022-02-09 MED ORDER — IBUPROFEN 600 MG PO TABS: 600.0000 mg | ORAL_TABLET | Freq: Two times a day (BID) | ORAL | 1 refills | Status: DC | PRN

## 2022-02-09 NOTE — Patient Instructions (Signed)
Achilles Tendinitis Rehab ?Ask your health care provider which exercises are safe for you. Do exercises exactly as told by your health care provider and adjust them as directed. It is normal to feel mild stretching, pulling, tightness, or discomfort as you do these exercises. Stop right away if you feel sudden pain or your pain gets worse. Do not begin these exercises until told by your health care provider. ?Stretching and range-of-motion exercises ?These exercises warm up your muscles and joints and improve the movement and flexibility of your ankle. These exercises also help to relieve pain. ?Standing wall calf stretch with straight knee ? ?Stand with your hands against a wall. ?Extend your left / right leg behind you, and bend your front knee slightly. Keep both of your heels on the floor. ?Point the toes of your back foot slightly inward. ?Keeping your heels on the floor and your back knee straight, shift your weight toward the wall. Do not allow your back to arch. You should feel a gentle stretch in your upper calf. ?Hold this position for __________ seconds. ?Repeat __________ times. Complete this exercise __________ times a day. ?Standing wall calf stretch with bent knee ?Stand with your hands against a wall. ?Extend your left / right leg behind you, and bend your front knee slightly. Keep both of your heels on the floor. ?Point the toes of your back foot slightly inward. ?Keeping your heels on the floor, bend your back knee slightly. You should feel a gentle stretch deep in your lower calf near your heel. ?Hold this position for __________ seconds. ?Repeat __________ times. Complete this exercise __________ times a day. ?Strengthening exercises ?These exercises build strength and control of your ankle. Endurance is the ability to use your muscles for a long time, even after they get tired. ?Plantar flexion with band ?In this exercise, you push your toes downward, away from you, with an exercise band  providing resistance. ?Sit on the floor with your left / right leg extended. You may put a pillow under your calf to give your foot more room to move. ?Loop a rubber exercise band or tube around the ball of your left / right foot. The ball of your foot is on the walking surface, right under your toes. The band or tube should be slightly tense when your foot is relaxed. If the band or tube slips, you can put on your shoe or put a washcloth between the band and your foot to help it stay in place. ?Slowly point your toes downward, pushing them away from you (plantar flexion). ?Hold this position for __________ seconds. ?Slowly release the tension in the band or tube, controlling smoothly until your foot is back to the starting position. ?Repeat steps 1-5 with your left / right leg. ?Repeat __________ times. Complete this exercise __________ times a day. ?Eccentric heel drop ?In this exercise, you stand and slowly raise your heel and then slowly lower it. This exercise lengthens the calf muscles (eccentric) while the heel bears weight. ?If this exercise is too easy, try doing it while wearing a backpack with weights in it. ?Stand on a step with the balls of your feet. The ball of your foot is on the walking surface, right under your toes. ?Do not put your heels on the step. ?For balance, rest your hands on the wall or on a railing. ?Rise up onto the balls of your feet. ?Keeping your heels up, shift all of your weight to your left / right leg and  pick up your other leg. ?Slowly lower your left / right leg so your heel drops below the level of the step. ?Put down your other foot before returning to the start position. If told by your health care provider, build up to: ?3 sets of 15 repetitions while keeping your knees straight. ?3 sets of 15 repetitions while keeping your knees slightly bent as far as told by your health care provider. ?Repeat __________ times. Complete this exercise __________ times a day. ?Balance  exercises ?These exercises improve or maintain your balance. Balance is important in preventing falls. ?Single leg stand ?If this exercise is too easy, you can try it with your eyes closed or while standing on a pillow. ?Without shoes, stand near a railing or in a door frame. Hold on to the railing or door frame as needed. ?Stand on your left / right foot. Keep your big toe down on the floor and try to keep your arch lifted. ?Hold this position for __________ seconds. ?Repeat __________ times. Complete this exercise __________ times a day. ?This information is not intended to replace advice given to you by your health care provider. Make sure you discuss any questions you have with your health care provider. ?Document Revised: 12/20/2020 Document Reviewed: 12/20/2020 ?Elsevier Patient Education ? [place]. ? ?

## 2022-02-09 NOTE — Progress Notes (Signed)
? ? ?  SUBJECTIVE:  ? ?CHIEF COMPLAINT / HPI:  ? ?Back Pain ?This is a chronic problem. Episode onset: started more than a year ago. The problem has been gradually worsening since onset. The pain is present in the lumbar spine. The quality of the pain is described as shooting. Radiates to: radiates to her right LL. The pain is at a severity of 8/10. The pain is moderate. The symptoms are aggravated by standing. Stiffness is present: feels stiff on and off. Pertinent negatives include no bladder incontinence, bowel incontinence, numbness, paresis or weakness. (No fall at home) She has tried NSAIDs (Ibuprofen 400 mg prn) for the symptoms. The treatment provided moderate relief.  ? ?Ankle: Pain achilis B/L worse on left. 1-2 years. First in the morning and prolonged standing. ? ?Fatigue: ?She gets tired lately and wonders if she has anemia. ? ?Brain mass/Seizure: ?She stated that she sees Neurologist every 6 months for monitoring. She is compliant with her seizure medication.  ? ?PERTINENT  PMH / PSH: PMHx reviewed ? ?OBJECTIVE:  ? ?BP 140/90   Ht '5\' 7"'$  (1.702 m)   Wt 222 lb 8 oz (100.9 kg)   LMP 01/29/2022   BMI 34.85 kg/m?   ?Physical Exam ?Vitals reviewed.  ?Cardiovascular:  ?   Rate and Rhythm: Normal rate and regular rhythm.  ?   Heart sounds: Normal heart sounds. No murmur heard. ?Pulmonary:  ?   Effort: Pulmonary effort is normal. No respiratory distress.  ?   Breath sounds: Normal breath sounds. No wheezing.  ?Musculoskeletal:     ?   General: No swelling or deformity.  ?   Right lower leg: No edema.  ?   Left lower leg: No edema.  ?   Comments: Mild b/l achilles tenderness.Mild lower lumbar tenderness  ?Neurological:  ?   General: No focal deficit present.  ? ? ? ?ASSESSMENT/PLAN:  ? ?DJD (degenerative joint disease), lumbar ?I reviewed and discussed her MRI from 2019 which shows DJD. ?PT was not useful in the past, hence, she declined referral. ?I increased her Ibuprofen to 600 mg prn and added  Baclofen. ?Will not start Gabapentin for radiculopathy pain since she is also on Keppra. ?Orth evaluation discussed. Referral placed. ?F/U as needed.  ? ?Achilles tendinitis ?Home exercises provided. ?Use Ibuprofen as needed. ?Consider podiatrist referral in the future. ? ?Brain mass ?Per patient, this is managed by her neurologist. ?Follow up with them as planned.  ? ?Seizure disorder ([place]) ?Stable on Keppra ?F/U with neuro as planned.  ?  ?Fatigue: ?TSH, CBC,Bmet checked. ?I will contact her soon with her results.  ? ?Andrena Mews, MD ?[place]  ? ? ?

## 2022-02-09 NOTE — Assessment & Plan Note (Signed)
Per patient, this is managed by her neurologist. ?Follow up with them as planned.  ?

## 2022-02-09 NOTE — Assessment & Plan Note (Signed)
Stable on Keppra ?F/U with neuro as planned.  ?

## 2022-02-09 NOTE — Assessment & Plan Note (Signed)
I reviewed and discussed her MRI from 2019 which shows DJD. ?PT was not useful in the past, hence, she declined referral. ?I increased her Ibuprofen to 600 mg prn and added Baclofen. ?Will not start Gabapentin for radiculopathy pain since she is also on Keppra. ?Orth evaluation discussed. Referral placed. ?F/U as needed.  ?

## 2022-02-09 NOTE — Assessment & Plan Note (Signed)
Home exercises provided. ?Use Ibuprofen as needed. ?Consider podiatrist referral in the future. ?

## 2022-02-10 LAB — TSH: TSH: 1.84 u[IU]/mL (ref 0.450–4.500)

## 2022-02-10 LAB — BASIC METABOLIC PANEL
BUN/Creatinine Ratio: 9 (ref 9–23)
BUN: 7 mg/dL (ref 6–24)
CO2: 27 mmol/L (ref 20–29)
Calcium: 9.4 mg/dL (ref 8.7–10.2)
Chloride: 103 mmol/L (ref 96–106)
Creatinine, Ser: 0.74 mg/dL (ref 0.57–1.00)
Glucose: 86 mg/dL (ref 70–99)
Potassium: 4.4 mmol/L (ref 3.5–5.2)
Sodium: 139 mmol/L (ref 134–144)
eGFR: 103 mL/min/{1.73_m2} (ref >59)

## 2022-02-10 LAB — CBC WITH DIFFERENTIAL/PLATELET
Basophils Absolute: 0.1 10*3/uL (ref 0.0–0.2)
Basos: 1 %
EOS (ABSOLUTE): 0.1 10*3/uL (ref 0.0–0.4)
Eos: 2 %
Hematocrit: 38.7 % (ref 34.0–46.6)
Hemoglobin: 12.5 g/dL (ref 11.1–15.9)
Immature Grans (Abs): 0 10*3/uL (ref 0.0–0.1)
Immature Granulocytes: 0 %
Lymphocytes Absolute: 2 10*3/uL (ref 0.7–3.1)
Lymphs: 35 %
MCH: 25 pg — ABNORMAL LOW (ref 26.6–33.0)
MCHC: 32.3 g/dL (ref 31.5–35.7)
MCV: 77 fL — ABNORMAL LOW (ref 79–97)
Monocytes Absolute: 0.6 10*3/uL (ref 0.1–0.9)
Monocytes: 10 %
Neutrophils Absolute: 2.9 10*3/uL (ref 1.4–7.0)
Neutrophils: 52 %
Platelets: 400 10*3/uL (ref 150–450)
RBC: 5 x10E6/uL (ref 3.77–5.28)
RDW: 14.9 % (ref 11.7–15.4)
WBC: 5.6 10*3/uL (ref 3.4–10.8)

## 2022-02-15 NOTE — Progress Notes (Signed)
I called and discussed normal test result with her. No further questions.

## 2022-02-18 ENCOUNTER — Other Ambulatory Visit: Payer: Self-pay | Admitting: Family Medicine

## 2022-02-21 ENCOUNTER — Ambulatory Visit: Payer: Self-pay

## 2022-02-21 ENCOUNTER — Encounter: Payer: Self-pay | Admitting: Surgery

## 2022-02-21 ENCOUNTER — Ambulatory Visit: Payer: 59 | Admitting: Surgery

## 2022-02-21 VITALS — BP 132/88 | HR 78 | Ht 67.0 in | Wt 224.0 lb

## 2022-02-21 DIAGNOSIS — M25551 Pain in right hip: Secondary | ICD-10-CM

## 2022-02-21 DIAGNOSIS — M533 Sacrococcygeal disorders, not elsewhere classified: Secondary | ICD-10-CM

## 2022-02-21 DIAGNOSIS — M545 Low back pain, unspecified: Secondary | ICD-10-CM

## 2022-02-21 DIAGNOSIS — G8929 Other chronic pain: Secondary | ICD-10-CM

## 2022-02-22 LAB — RHEUMATOID FACTOR: Rheumatoid fact SerPl-aCnc: <14 IU/mL (ref <14)

## 2022-02-22 LAB — CBC
HCT: 40.7 % (ref 35.0–45.0)
Hemoglobin: 12.9 g/dL (ref 11.7–15.5)
MCH: 25.3 pg — ABNORMAL LOW (ref 27.0–33.0)
MCHC: 31.7 g/dL — ABNORMAL LOW (ref 32.0–36.0)
MCV: 79.8 fL — ABNORMAL LOW (ref 80.0–100.0)
MPV: 10.4 fL (ref 7.5–12.5)
Platelets: 370 10*3/uL (ref 140–400)
RBC: 5.1 10*6/uL (ref 3.80–5.10)
RDW: 14.9 % (ref 11.0–15.0)
WBC: 6.6 10*3/uL (ref 3.8–10.8)

## 2022-02-22 LAB — ANA: Anti Nuclear Antibody (ANA): NEGATIVE

## 2022-02-22 LAB — URIC ACID: Uric Acid, Serum: 5.2 mg/dL (ref 2.5–7.0)

## 2022-02-22 LAB — SEDIMENTATION RATE: Sed Rate: 17 mm/h (ref 0–20)

## 2022-03-05 ENCOUNTER — Ambulatory Visit (INDEPENDENT_AMBULATORY_CARE_PROVIDER_SITE_OTHER): Payer: 59 | Admitting: Physical Medicine and Rehabilitation

## 2022-03-05 ENCOUNTER — Ambulatory Visit (INDEPENDENT_AMBULATORY_CARE_PROVIDER_SITE_OTHER): Payer: 59 | Admitting: Physical Therapy

## 2022-03-05 ENCOUNTER — Ambulatory Visit: Payer: Self-pay

## 2022-03-05 ENCOUNTER — Encounter: Payer: Self-pay | Admitting: Physical Medicine and Rehabilitation

## 2022-03-05 ENCOUNTER — Encounter: Payer: Self-pay | Admitting: Physical Therapy

## 2022-03-05 DIAGNOSIS — M5441 Lumbago with sciatica, right side: Secondary | ICD-10-CM | POA: Diagnosis not present

## 2022-03-05 DIAGNOSIS — M47816 Spondylosis without myelopathy or radiculopathy, lumbar region: Secondary | ICD-10-CM

## 2022-03-05 DIAGNOSIS — M545 Low back pain, unspecified: Secondary | ICD-10-CM

## 2022-03-05 DIAGNOSIS — R262 Difficulty in walking, not elsewhere classified: Secondary | ICD-10-CM | POA: Diagnosis not present

## 2022-03-05 DIAGNOSIS — M6281 Muscle weakness (generalized): Secondary | ICD-10-CM

## 2022-03-05 DIAGNOSIS — G8929 Other chronic pain: Secondary | ICD-10-CM

## 2022-03-05 NOTE — Therapy (Addendum)
OUTPATIENT PHYSICAL THERAPY THORACOLUMBAR EVALUATION /DISCHARGE   Patient Name: Diane Harvey MRN: [id number] DOB:07/29/1981, 41 y.o., female Today's Date: 03/05/2022   PT End of Session - 03/05/22 1514     Visit Number 1    Number of Visits 13    Date for PT Re-Evaluation 04/20/22    Authorization Type [employer]    PT Start Time 1430    PT Stop Time 1513    PT Time Calculation (min) 43 min    Activity Tolerance Patient tolerated treatment well    Behavior During Therapy WFL for tasks assessed/performed             Past Medical History:  Diagnosis Date   Anemia    Arthritis    lower back   Back pain    Bilateral knee pain 06/22/2014   No current problems as of 03/17/18   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  Unknown name.  Has tried injections with good results.  Plans on returning.  No current problems as of 03/17/18   New onset seizure (HCC) 01/10/2021   Seizures ([facility]CC) 01/04/2021   Supervision of other normal pregnancy 04/07/2015    Clinic/provider [facility] ([place]) Prenatal Labs  Dating LMP and 21w u/s Blood type: O/POS/-- (02/16 1350)   Genetic Screen declined Antibody:NEG (02/16 1350)  Anatomic US HC<3rd %ile at 25w Rubella: 2.16 (02/16 1350)  GTT Early: 125               Third trimester: 3 hour normal RPR: NON REAC (05/26 0909)   Flu vaccine Off season HBsAg: NEGATIVE (02/16 1350)   TDaP vaccine 04/07/15                                              Rhogam: not indicated HIV: NONREACTIVE (05/26 0909)   GBS      Negative                                (For PCN allergy, check sensitivities) GBS:NOT DETECTED (07/01 1440)  Contraception BTL - consent signed 04/07/15; Changed mind and wants Depo Pap: 09/26/12 - negative w/ +HR HPV, needs repeat pap postpartum  Baby Food Bottle   Circumcision N/a   Pediatrician [facility]    Support Person FOB        SVD (spontaneous vaginal delivery)    x 3   Past Surgical History:  Procedure Laterality Date   CARPAL TUNNEL RELEASE Bilateral    2 separate surgeries   CESAREAN SECTION N/A 06/09/2015   Procedure: CESAREAN SECTION;  Surgeon: Ugonna A Anyanwu, MD;  Location: [facility];  Service: Obstetrics;  Laterality: N/A;   LAPAROSCOPIC TUBAL LIGATION Bilateral 03/25/2018   Procedure: LAPAROSCOPIC TUBAL LIGATION;  Surgeon: Dove, Myra C, MD;  Location: [facility];  Service: Gynecology;  Laterality: Bilateral;   WISDOM TOOTH EXTRACTION     Patient Active Problem List   Diagnosis Date Noted   DJD (degenerative joint disease), lumbar 02/09/2022   Brain mass 07/24/2021   Suprapubic mass 05/12/2021   Achilles tendinitis 05/12/2021   Seizure disorder (HCC) 01/10/2021     Lumbar pain 08/30/2015   Hemorrhoid 05/20/2015   Carpal tunnel syndrome 10/27/2013   Obesity 09/26/2012    PCP: Eniola, Kehinde T, MD  REFERRING PROVIDER: Owens, James M, PA-C  REFERRING DIAG: M54.50 (ICD-10-CM) - Low back pain, unspecified back pain laterality, unspecified chronicity, unspecified whether sciatica present M53.3,G89.29 (ICD-10-CM) - Chronic right SI joint pain  THERAPY DIAG:  Chronic bilateral low back pain with right-sided sciatica  Muscle weakness (generalized)  Difficulty in walking, not elsewhere classified  ONSET DATE: 3-4 years  SUBJECTIVE:                                                                                                                                                                                           SUBJECTIVE STATEMENT: Pt arriving today reporting 3-4 history of low back pain. Pt stating her pain has worsened. Pt was treated in 2019 with physical therapy, but feels it didn't help. Pt stating she only tried exercises. Pt reporting her sleep has been effected. 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 Pt stating she has to  take breaks more to help with the pain.   PERTINENT HISTORY:   arhtritis, back pain, headaches, bilateral knee pain, brain mass Rt temporal lobe, seizures, C-section   PAIN:  Are you having pain? Yes: NPRS scale: 8/10 Pain location: low back  Pain description: sharp which runs down Rt leg to ankle Aggravating factors: standing walking and sometimes when I'm sittting Relieving factors: heat pack, over the counter pain meds   PRECAUTIONS: h/o seizures  WEIGHT BEARING RESTRICTIONS No  FALLS:  Has patient fallen in last 6 months? No  LIVING ENVIRONMENT: Lives with: lives with their family and lives with their spouse and kids Lives in: House/apartment Stairs: Yes: Internal: 13 steps; on right going up Has following equipment at home: None  OCCUPATION: retail manager  PLOF: Independent  PATIENT GOALS:   "help ease my pain"   OBJECTIVE:   DIAGNOSTIC FINDINGS:  MRI 2019, X-rays 2 weeks ago  PATIENT SURVEYS:  03/05/2022: FOTO 33%  Predicted 51%  SCREENING FOR RED FLAGS: Bowel or bladder incontinence: No Spinal tumors: No   COGNITION:  Overall cognitive status: Within functional limits for tasks assessed     SENSATION: WFL dermatomes : L3-L5  MUSCLE LENGTH: Hamstrings: Right 80 deg; limited by pain Left 95 deg   POSTURE:  Rounded shoulders, forward head  PALPATION: TTP: PSIS and lumbar paraspinals  LUMBAR ROM:   Active  A/PROM  03/05/2022  Flexion 45  Extension 18  Right lateral flexion 27  Left lateral flexion 30  Right rotation     Left rotation    (Blank rows = not tested) Pt reported pain with extension following forward flexion  LE ROM:  Active  Right 03/05/2022 Left 03/05/2022  Hip flexion 100 100   (Blank rows = not tested)  LE MMT: MMT Right 03/05/2022 Left 03/05/2022  Hip flexion 4+/5 4+/5  Hip extension    Hip abduction 5/5 5/5  Hip adduction 5/5 5/5  Hip internal rotation    Hip external rotation    Knee flexion 5/5 4/5  Knee  extension 5/5 5/5  Ankle dorsiflexion    Ankle plantarflexion    Ankle inversion    Ankle eversion      LUMBAR SPECIAL TESTS:  Straight leg raise test: Negative and Slump test: Negative  FUNCTIONAL TESTS:  5 times sit to stand: 22 seconds with UE support  GAIT: Distance walked: 50 feet Assistive device utilized: None Level of assistance: Complete Independence Comments: wide BOS, antalgic gait with mild forward flexion    TODAY'S TREATMENT  HEP instruction/performance c cues for techniques, handout provided.  Trial set performed of each for comprehension and symptom assessment.  See below for exercise list.   PATIENT EDUCATION:  Education details: PT POC, HEP Person educated: Patient Education method: Explanation, Demonstration, Tactile cues, Verbal cues, and Handouts Education comprehension: verbalized understanding and returned demonstration   HOME EXERCISE PROGRAM: Access Code: [email or web address] URL: [email or web address] Date: 03/05/2022 Prepared by: Kearney Hard  Exercises - Supine Bridge  - 2 x daily - 7 x weekly - 2 sets - 10 reps - 5 seconds hold - Supine Lower Trunk Rotation  - 2 x daily - 7 x weekly - 3 reps - 20-30 seconds hold - Supine Piriformis Stretch with Foot on Ground  - 2 x daily - 7 x weekly - 3 reps - 20-30 seconds hold - Supine Figure 4 Piriformis Stretch  - 2 x daily - 7 x weekly - 3 reps - 20-30 seconds hold - Standing Thoracic Extension at Wall  - 2 x daily - 7 x weekly - 10 reps - 5-10 seconds hold  ASSESSMENT:  CLINICAL IMPRESSION: Patient is a 41 y.o. who comes to clinic with complaints of low back and right SI joint pain with mobility, strength and movement coordination deficits that impair their ability to perform usual daily and recreational functional activities without increase difficulty/symptoms at this time.  Pt with mild weakness noted in left hip flexion. Pt with active trigger points noted in bilateral piriformis muscles  and along SI joint. Pt was issued handout about DN for a future visit. Patient to benefit from skilled PT services to address impairments and limitations to improve to previous level of function without restriction secondary to condition.     OBJECTIVE IMPAIRMENTS Abnormal gait, decreased activity tolerance, decreased balance, decreased mobility, difficulty walking, decreased ROM, decreased strength, increased edema, impaired flexibility, impaired tone, postural dysfunction, and pain.   ACTIVITY LIMITATIONS community activity.   PERSONAL FACTORS arhtritis, back pain, headaches, bilateral knee pain, brain mass Rt temporal lobe, seizures, C-section are also affecting patient's functional outcome.    REHAB POTENTIAL: Good  CLINICAL DECISION MAKING: Stable/uncomplicated  EVALUATION COMPLEXITY: Moderate   GOALS: Goals reviewed with patient? Yes Short term PT Goals (target date for Short term goals are 3 weeks 03/30/2022) Patient will demonstrate independent use of home exercise program to maintain progress from in clinic treatments. Goal status: New   Long term PT goals (target dates for all long term goals are 6 weeks  04/13/2022 ) Patient  will demonstrate/report pain at worst less than or equal to 2/10 to facilitate minimal limitation in daily activity secondary to pain symptoms. Goal status: New  Patient will demonstrate independent use of home exercise program to facilitate ability to maintain/progress functional gains from skilled physical therapy services. Goal status: New  Patient will demonstrate FOTO outcome > or = 51 % to indicate reduced disability due to condition. Goal status: New  Pt will be able to improve her 5 time sit to stand to >/= 15 seconds.  Goal status: New      5.  Pt will be able to amb up and down steps with pain </= 2/10 in her low back with single hand rail.  Goal status: New  PLAN: PT FREQUENCY: 1-2x/week  PT DURATION: 6 weeks  PLANNED INTERVENTIONS:  Therapeutic exercises, Therapeutic activity, Neuromuscular re-education, Balance training, Gait training, Patient/Family education, Joint manipulation, Joint mobilization, Stair training, Dry Needling, Electrical stimulation, Spinal manipulation, Cryotherapy, Moist heat, Taping, Traction, Ultrasound, and Manual therapy.  PLAN FOR NEXT SESSION: Nustep, LE strengthening, core strengthening, body mechanics   Jennifer R Martin, PT, MPT 03/05/2022, 5:01 PM  PHYSICAL THERAPY DISCHARGE SUMMARY  Visits from Start of Care: 1  Current functional level related to goals / functional outcomes: See note   Remaining deficits: See note   Education / Equipment: HEP   Patient goals were not met. Patient is being discharged due to not returning since the last visit.  Michael Wright, PT, DPT, OCS, ATC 04/06/22  9:00 AM    

## 2022-03-05 NOTE — Progress Notes (Signed)
? ?NYEEMA WANT - 41 y.o. female MRN [id number]  Date of birth: May 07, 1981 ? ?Office Visit Note: ?Visit Date: 03/05/2022 ?PCP: Kinnie Feil, MD ?Referred by: Lanae Crumbly, PA-C ? ?Subjective: ?Chief Complaint  ?Patient presents with  ? Lower Back - Pain  ? Right Leg - Pain  ? ?HPI: Diane Harvey is a 41 y.o. female who comes in today at the request of Benjiman Core for evaluation of chronic, worsening and severe bilateral lower back pain with intermittent radiation down right leg. Most severe pain to so bilateral lower back. Patient reports pain has been ongoing for several months. Patient states her pain is exacerbated by movement, activity and prolonged sitting. Diane Harvey describes pain as a constant sore and aching sensation, currently rates as 8 out of 10. Patient reports some relief of pain with home exercise regimen, use of heating pad, rest and medications as needed. Patient reports history of formal physical therapy at [facility], no relief of pain with these treatments. Patient is scheduled to re-group with our in house physical therapy team this afternoon. Patients lumbar MRI from 2019 exhibits bilateral facet arthropathy, left worse than right at L4-L5 that could be associated with referred facet syndrome. No high grade spinal stenosis noted. Patient has no history of lumbar surgery, no history of lumbar epidural steroid injections. Patient denies focal weakness, numbness and tingling. Patient denies recent trauma or falls.  ? ? ?Oswestry Disability Index Score 36%: 10 to 20 (40%) moderate disability: The patient experiences more pain and difficulty with sitting, lifting and standing. Travel and social life are more difficult, and they may be disabled from work. Personal care, sexual activity and sleeping are not grossly affected, and the patient can usually be managed by conservative means. ? ?Review of Systems  ?Musculoskeletal:  Positive for back pain.  ?Neurological:   Negative for tingling, sensory change, focal weakness and weakness.  ?All other systems reviewed and are negative. Otherwise per HPI. ? ?Assessment & Plan: ?Visit Diagnoses:  ?  ICD-10-CM   ?1. Chronic bilateral low back pain without sciatica  M54.50 Ambulatory referral to Physical Medicine Rehab  ? G89.29   ?  ?2. Spondylosis without myelopathy or radiculopathy, lumbar region  M47.816 Ambulatory referral to Physical Medicine Rehab  ?  ?3. Facet arthropathy, lumbar  M47.816 Ambulatory referral to Physical Medicine Rehab  ?  ?   ?Plan: Findings:  ?Chronic, worsening and severe bilateral lower back pain with intermittent radiation down right leg.  Patient continues to have severe pain despite good conservative therapies such as home exercise regimen, use of heating pad, rest and medications.  Patient's clinical presentation and exam are consistent with facet mediated pain and her biggest complaint is low back pain above waistline..  Patient does have bilateral facet arthropathy at the level of L4-L5.  We believe the next step is to perform diagnostic and hopefully therapeutic bilateral L4-L5 facet joint/medial branch blocks under fluoroscopic guidance.  If patient gets good relief with medial branch blocks I did discuss the possibility of longer sustained pain relief with radiofrequency ablation procedure.  If no relief could still look at the sacroiliac joint. I did discuss injection procedure in detail today, Diane Harvey has no further questions at this time.  Patient encouraged to remain active and to continue with home exercise regimen and physical therapy as tolerated.  No red flag symptoms noted upon exam today.  ? ?Meds & Orders: No orders of the defined types were placed  in this encounter. ?  ?Orders Placed This Encounter  ?Procedures  ? Ambulatory referral to Physical Medicine Rehab  ?  ?Follow-up: Return for Bilateral L4-L5 facet joint/medial branch blocks.  ? ?Procedures: ?No procedures performed  ?   ? ?Clinical  History: ?EXAM: ?MRI LUMBAR SPINE WITHOUT AND WITH CONTRAST ?  ?TECHNIQUE: ?Multiplanar and multiecho pulse sequences of the lumbar spine were ?obtained without and with intravenous contrast. ?  ?CONTRAST:  74m MULTIHANCE GADOBENATE DIMEGLUMINE 529 MG/ML IV SOLN ?  ?COMPARISON:  Radiography 10/09/2017 ?  ?FINDINGS: ?Segmentation:  5 lumbar type vertebral bodies. ?  ?Alignment:  Normal ?  ?Vertebrae:  No fracture or primary lesion. ?  ?Conus medullaris and cauda equina: Conus extends to the L1-2 level. ?Conus and cauda equina appear normal. ?  ?Paraspinal and other soft tissues: Normal ?  ?Disc levels: ?  ?No abnormality at L L3-4 or above. The discs are normal. The canal ?and foramina are widely patent. ?  ?L4-5: No disc abnormality. Bilateral facet osteoarthritis left worse ?than right. No stenosis. The facet arthropathy could be a cause of ?back pain or referred facet syndrome pain. ?  ?L5-S1: Annular bulging with small annular fissure. No stenosis or ?neural compression. Minimal facet degeneration at this level. ?  ?IMPRESSION: ?Facet arthritis at L4-5 with mild edema and enhancement. This could ?be associated with back pain or referred facet syndrome pain. No ?stenosis or neural compression at this level. ?  ?Mild disc degeneration at L5-S1 with annular bulging and a small ?annular fissure. No stenosis or neural compression. Minimal facet ?arthritis. ?  ?  ?Electronically Signed ?  By: MNelson ChimesM.D. ?  On: 12/16/2017 19:30  ? ?Diane Harvey reports that Diane Harvey has never smoked. Diane Harvey has never used smokeless tobacco.  ?Recent Labs  ?  02/21/22 ?0929  ?LABURIC 5.2  ? ? ?Objective:  VS:  HT:    WT:   BMI:     BP:   HR: bpm  TEMP: ( )  RESP:  ?Physical Exam ?Vitals and nursing note reviewed.  ?HENT:  ?   Head: Normocephalic and atraumatic.  ?   Right Ear: External ear normal.  ?   Left Ear: External ear normal.  ?   Nose: Nose normal.  ?   Mouth/Throat:  ?   Mouth: Mucous membranes are moist.  ?Eyes:  ?   Extraocular  Movements: Extraocular movements intact.  ?Cardiovascular:  ?   Rate and Rhythm: Normal rate.  ?   Pulses: Normal pulses.  ?Pulmonary:  ?   Effort: Pulmonary effort is normal.  ?Abdominal:  ?   General: Abdomen is flat. There is no distension.  ?Musculoskeletal:     ?   General: Tenderness present.  ?   Cervical back: Normal range of motion.  ?   Comments: Pt is slow to rise from seated position to standing. Concordant low back pain with facet loading, lumbar spine extension and rotation. Strong distal strength without clonus, no pain upon palpation of greater trochanters. Sensation intact bilaterally. Walks independently, gait steady.   ?Skin: ?   General: Skin is warm and dry.  ?   Capillary Refill: Capillary refill takes less than 2 seconds.  ?Neurological:  ?   General: No focal deficit present.  ?   Mental Status: Diane Harvey is alert and oriented to person, place, and time.  ?Psychiatric:     ?   Mood and Affect: Mood normal.     ?   Behavior: Behavior normal.  ?  ?  Ortho Exam ? ?Imaging: ?No results found. ? ?Past Medical/Family/Surgical/Social History: ?Medications & Allergies reviewed per EMR, new medications updated. ?Patient Active Problem List  ? Diagnosis Date Noted  ? DJD (degenerative joint disease), lumbar 02/09/2022  ? Brain mass 07/24/2021  ? 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 Achilles tendinitis 05/12/2021  ? Seizure disorder ([place]) 01/10/2021  ? Lumbar pain 08/30/2015  ? Hemorrhoid 05/20/2015  ? Carpal tunnel syndrome 10/27/2013  ? Obesity 09/26/2012  ? ?Past Medical History:  ?Diagnosis Date  ? Anemia   ? Arthritis   ? lower back  ? Back pain   ? Bilateral knee pain 06/22/2014  ? No current problems as of 03/17/18  ? 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 Contraception 07/31/2011  ? 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 Headache   ? otc med prn  ? Knee pain 07/31/2011  ? Seen orthopedist for this.  Unknown name.  Has tried injections with good results.  Plans on returning.  No current problems as of 03/17/18  ? New onset  seizure ([place]) 01/10/2021  ? Seizures ([place]) 01/04/2021  ? Supervision of other normal pregnancy 04/07/2015  ?  Clinic/provider [facility]) Prenatal Labs  Dating LMP and 21w u/s Blood type: O/P

## 2022-03-05 NOTE — Progress Notes (Signed)
Pt state lower back pain that travels down her right leg. Pt state walking, standing and laying down makes the pain worse. Pt state she takes over the counter pain meds and uses heat to help ease her pain. ? ?Numeric Pain Rating Scale and Functional Assessment ?Average Pain 10 ?Pain Right Now 8 ?My pain is intermittent, sharp, stabbing, and aching ?Pain is worse with: walking, bending, sitting, standing, some activites, and laying down ?Pain improves with: heat/ice and medication ? ? ?In the last MONTH (on 0-10 scale) has pain interfered with the following? ? ?1. General activity like being  able to carry out your everyday physical activities such as walking, climbing stairs, carrying groceries, or moving a chair?  ?Rating(5) ? ?2. Relation with others like being able to carry out your usual social activities and roles such as  activities at home, at work and in your community. ?Rating(6) ? ?3. Enjoyment of life such that you have  been bothered by emotional problems such as feeling anxious, depressed or irritable?  ?Rating(7) ? ?

## 2022-03-14 ENCOUNTER — Encounter: Payer: Self-pay | Admitting: Family Medicine

## 2022-03-14 ENCOUNTER — Ambulatory Visit (INDEPENDENT_AMBULATORY_CARE_PROVIDER_SITE_OTHER): Payer: 59 | Admitting: Family Medicine

## 2022-03-14 VITALS — BP 121/70 | HR 68 | Temp 98.6°F | Ht 67.0 in | Wt 228.8 lb

## 2022-03-14 DIAGNOSIS — R0981 Nasal congestion: Secondary | ICD-10-CM

## 2022-03-14 MED ORDER — FLUTICASONE PROPIONATE 50 MCG/ACT NA SUSP: 2.0000 | Freq: Every day | NASAL | 6 refills | Status: DC

## 2022-03-14 NOTE — Progress Notes (Signed)
? ? ?  SUBJECTIVE:  ? ?CHIEF COMPLAINT / HPI:  ? ?Nasal congestion ?2 weeks of nasal congestion.  She has attempted over-the-counter medication such as Delsym, Alka-Seltzer, DayQuil, NyQuil, TheraFlu without relief.  She denies fever, headache, sore throat, ear pain, chest pain, shortness of breath, body aches.  She does however endorse cough.  She describes it as feeling as something is running down her throat and causes her to cough otherwise the cough is not constant.  Denies prior history of seasonal allergies. ? ?PERTINENT  PMH / PSH: As above. ? ?OBJECTIVE:  ? ?BP 121/70   Pulse 68   Temp 98.6 ?F (37 ?C)   Ht '5\' 7"'$  (1.702 m)   Wt 228 lb 12.8 oz (103.8 kg)   LMP 02/28/2022 (Approximate)   SpO2 98%   BMI 35.84 kg/m?   ?Physical Exam ?Vitals reviewed.  ?Constitutional:   ?   General: She is not in acute distress. ?   Appearance: She is not ill-appearing, toxic-appearing or diaphoretic.  ?HENT:  ?   Head: Normocephalic.  ?   Right Ear: Tympanic membrane normal.  ?   Left Ear: Tympanic membrane normal.  ?   Nose: Congestion present. No rhinorrhea.  ?   Mouth/Throat:  ?   Pharynx: No oropharyngeal exudate or posterior oropharyngeal erythema.  ?Eyes:  ?   Conjunctiva/sclera: Conjunctivae normal.  ?Cardiovascular:  ?   Rate and Rhythm: Normal rate and regular rhythm.  ?   Heart sounds: Normal heart sounds.  ?Pulmonary:  ?   Effort: Pulmonary effort is normal.  ?   Breath sounds: Normal breath sounds.  ?Musculoskeletal:  ?   Cervical back: Neck supple.  ?Lymphadenopathy:  ?   Cervical: No cervical adenopathy.  ?Neurological:  ?   Mental Status: She is alert and oriented to person, place, and time.  ?Psychiatric:     ?   Mood and Affect: Mood normal.     ?   Behavior: Behavior normal.  ? ?ASSESSMENT/PLAN:  ? ?Nasal congestion ?2 weeks of nasal congestion with cough secondary to postnasal drip.  OTC medications trial without relief.  No prior history of Flonase use or seasonal allergies as reported by patient.  She  is afebrile and otherwise has noticeable nasal congestion.  Lungs are clear to auscultation and oropharynx is unremarkable.  This is likely allergic rhinitis due to environmental triggers unless she develops any other sick symptoms.  For now will trial Flonase for symptomatic relief.  Patient is encouraged to follow-up if she develops any other symptoms or symptoms worsen.  Of note she is due for her Pap smear asked to schedule at her earliest convenience to get this done. ?- fluticasone (FLONASE) 50 MCG/ACT nasal spray; Place 2 sprays into both nostrils daily.  Dispense: 16 g; Refill: 6 ? ?Zanai Mallari Autry-Lott, DO ?[place]  ?

## 2022-03-14 NOTE — Patient Instructions (Addendum)
It was wonderful to see you today. ? ?Please bring ALL of your medications with you to every visit.  ? ?Today we talked about: ? ?Nasal congestion-without any other symptoms I do suspect allergic rhinitis due to environmental triggers.  I have prescribed Flonase please allow up to 2 weeks for this to take full effect.  If you continue to have nasal congestion or develop any other symptoms please follow-up. ? ?Please call the clinic at [phone] if your symptoms worsen or you have any concerns. It was our pleasure to serve you. ? ?Dr. Janus Molder ? ?

## 2022-03-16 ENCOUNTER — Encounter: Payer: 59 | Admitting: Rehabilitative and Restorative Service Providers"

## 2022-03-21 ENCOUNTER — Other Ambulatory Visit: Payer: Self-pay | Admitting: Family Medicine

## 2022-03-23 ENCOUNTER — Telehealth: Payer: Self-pay | Admitting: Rehabilitative and Restorative Service Providers"

## 2022-03-23 ENCOUNTER — Encounter: Payer: 59 | Admitting: Rehabilitative and Restorative Service Providers"

## 2022-03-23 NOTE — Telephone Encounter (Signed)
Called and left message after no show for today's appointment at 8:45 am.  Reminded of next appointment time.  ? ?Scot Jun, PT, DPT, OCS, ATC ?03/23/22  9:16 AM ? ? ?

## 2022-03-23 NOTE — Therapy (Incomplete)
?OUTPATIENT PHYSICAL THERAPY TREATMENT NOTE ? ? ?Patient Name: Diane Harvey ?MRN: [id number] ?DOB:11-May-1981, 41 y.o., female ?Today's Date: 03/23/2022 ? ? ?END OF SESSION:  ? ? ?Past Medical History:  ?Diagnosis Date  ? Anemia   ? Arthritis   ? lower back  ? Back pain   ? Bilateral knee pain 06/22/2014  ? No current problems as of 03/17/18  ? 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 Contraception 07/31/2011  ? 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 Headache   ? otc med prn  ? Knee pain 07/31/2011  ? Seen orthopedist for this.  Unknown name.  Has tried injections with good results.  Plans on returning.  No current problems as of 03/17/18  ? New onset seizure ([place]) 01/10/2021  ? Seizures ([place]) 01/04/2021  ? Supervision of other normal pregnancy 04/07/2015  ?  Clinic/provider [facility]) Prenatal Labs  Dating LMP and 21w u/s Blood type: O/POS/-- (02/16 1350)   Genetic Screen declined Antibody:NEG (02/16 1350)  Anatomic [place] HC<3rd %ile at 25w Rubella: 2.16 (02/16 1350)  GTT Early: 125               Third trimester: 3 hour normal RPR: NON REAC (05/26 0909)   Flu vaccine Off season HBsAg: NEGATIVE (02/16 1350)   TDaP vaccine 04/07/15                                              Rhogam: not indicated HIV: NONREACTIVE (05/26 0909)   GBS      Negative                                (For PCN allergy, check sensitivities) GBS:NOT DETECTED (07/01 1440)  Contraception BTL - consent signed 04/07/15; Changed mind and wants Depo Pap: 09/26/12 - negative w/ +HR HPV, needs repeat pap postpartum  Baby Food Bottle   Circumcision N/a   Pediatrician [facility]       ? SVD (spontaneous vaginal delivery)   ? x 3  ? ?Past Surgical History:  ?Procedure Laterality Date  ? CARPAL TUNNEL RELEASE Bilateral   ? 2 separate surgeries  ? CESAREAN SECTION N/A 06/09/2015  ? Procedure: CESAREAN SECTION;  Surgeon: Osborne Oman, MD;  Location: [facility];  Service: Obstetrics;  Laterality: N/A;  ? LAPAROSCOPIC TUBAL LIGATION  Bilateral 03/25/2018  ? Procedure: LAPAROSCOPIC TUBAL LIGATION;  Surgeon: Emily Filbert, MD;  Location: [facility];  Service: Gynecology;  Laterality: Bilateral;  ? WISDOM TOOTH EXTRACTION    ? ?Patient Active Problem List  ? Diagnosis Date Noted  ? DJD (degenerative joint disease), lumbar 02/09/2022  ? Brain mass 07/24/2021  ? 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 Achilles tendinitis 05/12/2021  ? Seizure disorder ([place]) 01/10/2021  ? Lumbar pain 08/30/2015  ? Hemorrhoid 05/20/2015  ? Carpal tunnel syndrome 10/27/2013  ? Obesity 09/26/2012  ? ? ?PCP: Kinnie Feil, MD ?  ?REFERRING PROVIDER: Lanae Crumbly, PA-C ?  ?REFERRING DIAG: M54.50 (ICD-10-CM) - Low back pain, unspecified back pain laterality, unspecified chronicity, unspecified whether sciatica present ?M53.3,G89.29 (ICD-10-CM) - Chronic right SI joint pain ? ?ONSET DATE: 3-4 years ? ?THERAPY DIAG:  ?No diagnosis found. ? ?PERTINENT HISTORY: arhtritis, back pain, headaches, bilateral knee pain, brain mass Rt  temporal lobe, seizures, C-section ? ?PRECAUTIONS: h/o seizures ? ?SUBJECTIVE: *** ? ?PAIN:  ?NPRS scale: 8/10 ?Pain location: low back  ?Pain description: sharp which runs down Rt leg to ankle ?Aggravating factors: standing walking and sometimes when I'm sittting ?Relieving factors: heat pack, over the counter pain meds ? ? ?OBJECTIVE:  ?  ?DIAGNOSTIC FINDINGS:  ?03/05/2022 MRI 2019, X-rays 2 weeks ago ?  ?PATIENT SURVEYS:  ?03/05/2022: FOTO 33%  Predicted 51% ?  ?SCREENING FOR RED FLAGS: ?03/05/2022 ?Bowel or bladder incontinence: No ?Spinal tumors: No ?  ?  ?COGNITION: ?03/05/2022 Overall cognitive status: Within functional limits for tasks assessed               ?           ?SENSATION: ?03/05/2022 WFL dermatomes : L3-L5 ?  ?MUSCLE LENGTH: ?03/05/2022 Hamstrings: Right 80 deg; limited by pain Left 95 deg ?   ?POSTURE:  ?03/05/2022 Rounded shoulders, forward head ?  ?PALPATION: ?03/05/2022 TTP: PSIS and lumbar paraspinals ?  ?LUMBAR ROM:  ?  ?Active  AROM  ?03/05/2022   ?Flexion 45  ?Extension 18  ?Right lateral flexion 27  ?Left lateral flexion 30  ?Right rotation    ?Left rotation    ? (Blank rows = not tested) ?Pt reported pain with extension following forward flexion ?  ?LE ROM: ?  ?Active  Right ?03/05/2022 Left ?03/05/2022  ?Hip flexion 100 100  ? (Blank rows = not tested) ?  ?LE MMT: ?MMT Right ?03/05/2022 Left ?03/05/2022  ?Hip flexion 4+/5 4+/5  ?Hip extension      ?Hip abduction 5/5 5/5  ?Hip adduction 5/5 5/5  ?Hip internal rotation      ?Hip external rotation      ?Knee flexion 5/5 4/5  ?Knee extension 5/5 5/5  ?Ankle dorsiflexion      ?Ankle plantarflexion      ?Ankle inversion      ?Ankle eversion      ?  ?  ?LUMBAR SPECIAL TESTS:  ?03/05/2022 Straight leg raise test: Negative and Slump test: Negative ?  ?FUNCTIONAL TESTS:  ?03/05/2022 5 times sit to stand: 22 seconds with UE support ?  ?GAIT: ?03/05/2022 ?Distance walked: 50 feet ?Assistive device utilized: None ?Level of assistance: Complete Independence ?Comments: wide BOS, antalgic gait with mild forward flexion ?  ?  ?  ?TODAY'S TREATMENT  ? ?03/23/2022 ? Therex:  ? ?03/05/2022 ?HEP instruction/performance c cues for techniques, handout provided.  Trial set performed of each for comprehension and symptom assessment.  See below for exercise list. ?  ?  ?PATIENT EDUCATION:  ?03/05/2022 ?Education details: PT POC, HEP ?Person educated: Patient ?Education method: Explanation, Demonstration, Tactile cues, Verbal cues, and Handouts ?Education comprehension: verbalized understanding and returned demonstration ?  ?  ?HOME EXERCISE PROGRAM: ?03/05/2022 ?Access Code: [email or web address] ?URL: [email or web address] ?Date: 03/05/2022 ?Prepared by: Kearney Hard ?  ?Exercises ?- Supine Bridge  - 2 x daily - 7 x weekly - 2 sets - 10 reps - 5 seconds hold ?- Supine Lower Trunk Rotation  - 2 x daily - 7 x weekly - 3 reps - 20-30 seconds hold ?- Supine Piriformis Stretch with Foot on Ground  - 2 x daily - 7 x weekly - 3 reps - 20-30  seconds hold ?- Supine Figure 4 Piriformis Stretch  - 2 x daily - 7 x weekly - 3 reps - 20-30 seconds hold ?- Standing Thoracic Extension at Wall  - 2 x daily - 7 x weekly - 10 reps -  5-10 seconds hold ?  ?ASSESSMENT: ?  ?CLINICAL IMPRESSION: ?Patient is a 41 y.o. who comes to clinic with complaints of low back and right SI joint pain with mobility, strength and movement coordination deficits that impair their ability to perform usual daily and recreational functional activities without increase difficulty/symptoms at this time.  Pt with mild weakness noted in left hip flexion. Pt with active trigger points noted in bilateral piriformis muscles and along SI joint. Pt was issued handout about DN for a future visit. Patient to benefit from skilled PT services to address impairments and limitations to improve to previous level of function without restriction secondary to condition.  ?  ?  ?  ?OBJECTIVE IMPAIRMENTS Abnormal gait, decreased activity tolerance, decreased balance, decreased mobility, difficulty walking, decreased ROM, decreased strength, increased edema, impaired flexibility, impaired tone, postural dysfunction, and pain.  ?  ?ACTIVITY LIMITATIONS community activity.  ?  ?PERSONAL FACTORS arhtritis, back pain, headaches, bilateral knee pain, brain mass Rt temporal lobe, seizures, C-section are also affecting patient's functional outcome.  ?  ?  ?REHAB POTENTIAL: Good ?  ?CLINICAL DECISION MAKING: Stable/uncomplicated ?  ?EVALUATION COMPLEXITY: Moderate ?  ?  ?GOALS: ?Goals reviewed with patient? Yes ?Short term PT Goals (target date for Short term goals are 3 weeks 03/30/2022) ?Patient will demonstrate independent use of home exercise program to maintain progress from in clinic treatments. ?Goal status: New ?  ?Long term PT goals (target dates for all long term goals are 6 weeks  04/13/2022 ) ?Patient will demonstrate/report pain at worst less than or equal to 2/10 to facilitate minimal limitation in daily  activity secondary to pain symptoms. ?Goal status: New ?  ?Patient will demonstrate independent use of home exercise program to facilitate ability to maintain/progress functional gains from skilled physical th

## 2022-03-28 ENCOUNTER — Telehealth: Payer: Self-pay | Admitting: Physical Medicine and Rehabilitation

## 2022-03-28 NOTE — Telephone Encounter (Signed)
Patient called in stating she would like to reschedule her injection appt please call and advise ?

## 2022-03-30 ENCOUNTER — Encounter: Payer: 59 | Admitting: Rehabilitative and Restorative Service Providers"

## 2022-03-30 ENCOUNTER — Telehealth: Payer: Self-pay | Admitting: Rehabilitative and Restorative Service Providers"

## 2022-03-30 NOTE — Telephone Encounter (Signed)
Called patient and left message after 2nd no show appointment in a row.  Left contact information for clinic and request to confirm whether she planned to attend next visit.  Cancelled any additional scheduled visits after the next one due to repeated no shows.   Scot Jun, PT, DPT, OCS, ATC 03/30/22  9:07 AM

## 2022-04-04 ENCOUNTER — Other Ambulatory Visit: Payer: Self-pay | Admitting: Family Medicine

## 2022-04-06 ENCOUNTER — Encounter: Payer: 59 | Admitting: Rehabilitative and Restorative Service Providers"

## 2022-04-09 ENCOUNTER — Other Ambulatory Visit: Payer: Self-pay | Admitting: Family Medicine

## 2022-04-10 ENCOUNTER — Encounter: Payer: 59 | Admitting: Physical Medicine and Rehabilitation

## 2022-04-13 ENCOUNTER — Encounter: Payer: 59 | Admitting: Rehabilitative and Restorative Service Providers"

## 2022-04-17 ENCOUNTER — Encounter: Payer: Self-pay | Admitting: *Deleted

## 2022-04-24 ENCOUNTER — Telehealth: Payer: Self-pay | Admitting: Physical Medicine and Rehabilitation

## 2022-04-24 NOTE — Telephone Encounter (Signed)
Patient called needing to reschedule her appointment with Dr. Ernestina Patches.  The number to contact patient is [phone]

## 2022-04-25 ENCOUNTER — Encounter: Payer: 59 | Admitting: Physical Medicine and Rehabilitation

## 2022-05-13 ENCOUNTER — Other Ambulatory Visit: Payer: Self-pay | Admitting: Nurse Practitioner

## 2022-05-13 DIAGNOSIS — N946 Dysmenorrhea, unspecified: Secondary | ICD-10-CM

## 2022-05-16 NOTE — Telephone Encounter (Signed)
Last annual exam was 8/22, no future annual exam scheduled

## 2022-05-28 ENCOUNTER — Encounter: Payer: 59 | Admitting: Physical Medicine and Rehabilitation

## 2022-06-16 ENCOUNTER — Other Ambulatory Visit: Payer: Self-pay | Admitting: Family Medicine
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
## 2022-06-20 ENCOUNTER — Encounter: Payer: Self-pay | Admitting: Adult Health

## 2022-06-20 ENCOUNTER — Ambulatory Visit: Payer: 59 | Admitting: Neurology

## 2022-06-20 ENCOUNTER — Ambulatory Visit (INDEPENDENT_AMBULATORY_CARE_PROVIDER_SITE_OTHER): Payer: 59 | Admitting: Adult Health

## 2022-06-20 VITALS — BP 137/92 | HR 76 | Ht 67.0 in | Wt 228.0 lb

## 2022-06-20 DIAGNOSIS — G40909 Epilepsy, unspecified, not intractable, without status epilepticus: Secondary | ICD-10-CM | POA: Diagnosis not present

## 2022-06-20 DIAGNOSIS — G9389 Other specified disorders of brain: Secondary | ICD-10-CM

## 2022-06-20 MED ORDER — LEVETIRACETAM 750 MG PO TABS: 750.0000 mg | ORAL_TABLET | Freq: Two times a day (BID) | ORAL | 3 refills | Status: DC

## 2022-06-20 NOTE — Progress Notes (Signed)
Can you please switch 1 yr f/u visit with me to Dr. April Manson? Thank you!

## 2022-06-27 ENCOUNTER — Telehealth: Payer: Self-pay | Admitting: Adult Health

## 2022-06-27 NOTE — Telephone Encounter (Signed)
I tried to get MRI authorization through [employer], they are saying that this patient is not a current member. I will send her a message to see if she has other insurance.

## 2022-07-03 ENCOUNTER — Encounter: Payer: 59 | Admitting: Family Medicine

## 2022-07-13 ENCOUNTER — Ambulatory Visit (INDEPENDENT_AMBULATORY_CARE_PROVIDER_SITE_OTHER): Payer: Commercial Managed Care - HMO | Admitting: Family Medicine

## 2022-07-13 ENCOUNTER — Other Ambulatory Visit (HOSPITAL_COMMUNITY)
Admission: RE | Admit: 2022-07-13 | Discharge: 2022-07-13 | Disposition: A | Discharge status: Home / Self Care | Payer: Commercial Managed Care - HMO | Source: Ambulatory Visit | Attending: Family Medicine | Admitting: Family Medicine

## 2022-07-13 ENCOUNTER — Encounter: Payer: Self-pay | Admitting: Family Medicine

## 2022-07-13 VITALS — BP 126/80 | HR 72 | Ht 67.0 in | Wt 224.0 lb

## 2022-07-13 DIAGNOSIS — N946 Dysmenorrhea, unspecified: Secondary | ICD-10-CM | POA: Diagnosis not present

## 2022-07-13 DIAGNOSIS — Z124 Encounter for screening for malignant neoplasm of cervix: Secondary | ICD-10-CM

## 2022-07-13 DIAGNOSIS — G40909 Epilepsy, unspecified, not intractable, without status epilepticus: Secondary | ICD-10-CM

## 2022-07-13 DIAGNOSIS — Z0000 Encounter for general adult medical examination without abnormal findings: Secondary | ICD-10-CM

## 2022-07-13 DIAGNOSIS — Z1159 Encounter for screening for other viral diseases: Secondary | ICD-10-CM

## 2022-07-13 DIAGNOSIS — M7662 Achilles tendinitis, left leg: Secondary | ICD-10-CM

## 2022-07-13 DIAGNOSIS — E669 Obesity, unspecified: Secondary | ICD-10-CM

## 2022-07-13 DIAGNOSIS — M7661 Achilles tendinitis, right leg: Secondary | ICD-10-CM

## 2022-07-13 MED ORDER — NORETHINDRONE 0.35 MG PO TABS: 1.0000 | ORAL_TABLET | Freq: Every day | ORAL | 1 refills | Status: DC

## 2022-07-13 NOTE — Assessment & Plan Note (Signed)
Counseling done on weight management. She does exercise regularly by walking at work. She lost a few pounds since her last visit.

## 2022-07-13 NOTE — Progress Notes (Signed)
Subjective:     Diane Harvey is a 41 y.o. female and is here for a comprehensive physical exam. The patient reports problems - Achillis tendon pain B/L x  1 year ago, worsening. She tried braces and pain meds with no improvement. Worsens with ambulation.  Social History   Socioeconomic History   Marital status: Single    Spouse name: Marshell Levan   Number of children: Not on file   Years of education: Not on file   Highest education level: Not on file  Occupational History    Comment: full time   Tobacco Use   Smoking status: Never   Smokeless tobacco: Never  Vaping Use   Vaping Use: Never used  Substance and Sexual Activity   Alcohol use: No   Drug use: No   Sexual activity: Yes    Birth control/protection: Surgical    Comment: BTL  Other Topics Concern   Not on file  Social History Narrative   Lives with husband and 3 children   Right Handed   Drinks around 10 cups caffeine daily   Social Determinants of Health   Financial Resource Strain: Not on file  Food Insecurity: Not on file  Transportation Needs: Not on file  Physical Activity: Not on file  Stress: Not on file  Social Connections: Not on file  Intimate Partner Violence: Not on file   Health Maintenance  Topic Date Due   Hepatitis C Screening  Never done   COVID-19 Vaccine (3 - Pfizer series) 11/07/2020   PAP SMEAR-Modifier  08/17/2021   INFLUENZA VACCINE  Never done   TETANUS/TDAP  04/06/2025   HIV Screening  Completed   HPV VACCINES  Aged Out    The following portions of the patient's history were reviewed and updated as appropriate: allergies, current medications, past family history, past medical history, past social history, past surgical history, and problem list.  Review of Systems Pertinent items are noted in HPI.   Objective:    BP 126/80   Pulse 72   Ht '5\' 7"'$  (1.702 m)   Wt 224 lb (101.6 kg)   LMP 06/18/2022   SpO2 98%   BMI 35.08 kg/m  General appearance: alert and  cooperative Head: Normocephalic, without obvious abnormality, atraumatic Eyes: conjunctivae/corneas clear. PERRL, EOM's intact. Fundi benign. Ears: normal TM's and external ear canals both ears Throat: lips, mucosa, and tongue normal; teeth and gums normal Lungs: clear to auscultation bilaterally Heart: regular rate and rhythm, S1, S2 normal, no murmur, click, rub or gallop Abdomen: Soft, mild tenderness over the palpable suprapubic mass (chronic from endometriosis). BS normoactive Pelvic: cervix normal in appearance, external genitalia normal, and Scanty vaginal discharge - asymptomatic Extremities: extremities normal, atraumatic, no cyanosis or edema Pulses: 2+ and symmetric Skin: Skin color, texture, turgor normal. No rashes or lesions Lymph nodes: Cervical, supraclavicular, and axillary nodes normal. Neurologic: Alert and oriented X 3, normal strength and tone. Normal symmetric reflexes. Normal coordination and gait    Assessment:    Healthy female exam.  Pelvic Exam    B/L Achillis tendinitis Plan:   PAP completed. She will return for COVID/Influenza vaccination. I advised she can get vaccinated at the pharmacy too. Birth control refilled F/U with gynecologist for endometriosis management. Counseling done on weight management. She does exercise regularly by walking at work. She lost a few pounds since her last visit. FLP checked today. Hep C screening discussed and was completed today. Mammogram starting at age 75 or 65  Achillis tendinitis I discussed PT/Sport medicine referral. We both decided on PT referral for now.  Referral placed. Continue Ibuprofen as needed for pain.   Other chronic problems - Seizure She is compliant with neurology f/u and with her medications.    Andrena Mews, MD [place]

## 2022-07-13 NOTE — Patient Instructions (Signed)
Preventive Care 8-43 Years Old, Female Preventive care refers to lifestyle choices and visits with your health care provider that can promote health and wellness. Preventive care visits are also called wellness exams. What can I expect for my preventive care visit? Counseling Your health care provider may ask you questions about your: Medical history, including: Past medical problems. Family medical history. Pregnancy history. Current health, including: Menstrual cycle. Method of birth control. Emotional well-being. Home life and relationship well-being. Sexual activity and sexual health. Lifestyle, including: Alcohol, nicotine or tobacco, and drug use. Access to firearms. Diet, exercise, and sleep habits. Work and work Statistician. Sunscreen use. Safety issues such as seatbelt and bike helmet use. Physical exam Your health care provider will check your: Height and weight. These may be used to calculate your BMI (body mass index). BMI is a measurement that tells if you are at a healthy weight. Waist circumference. This measures the distance around your waistline. This measurement also tells if you are at a healthy weight and may help predict your risk of certain diseases, such as type 2 diabetes and high blood pressure. Heart rate and blood pressure. Body temperature. Skin for abnormal spots. What immunizations do I need?  Vaccines are usually given at various ages, according to a schedule. Your health care provider will recommend vaccines for you based on your age, medical history, and lifestyle or other factors, such as travel or where you work. What tests do I need? Screening Your health care provider may recommend screening tests for certain conditions. This may include: Lipid and cholesterol levels. Diabetes screening. This is done by checking your blood sugar (glucose) after you have not eaten for a while (fasting). Pelvic exam and Pap test. Hepatitis B test. Hepatitis  C test. HIV (human immunodeficiency virus) test. STI (sexually transmitted infection) testing, if you are at risk. Lung cancer screening. Colorectal cancer screening. Mammogram. Talk with your health care provider about when you should start having regular mammograms. This may depend on whether you have a family history of breast cancer. BRCA-related cancer screening. This may be done if you have a family history of breast, ovarian, tubal, or peritoneal cancers. Bone density scan. This is done to screen for osteoporosis. Talk with your health care provider about your test results, treatment options, and if necessary, the need for more tests. Follow these instructions at home: Eating and drinking  Eat a diet that includes fresh fruits and vegetables, whole grains, lean protein, and low-fat dairy products. Take vitamin and mineral supplements as recommended by your health care provider. Do not drink alcohol if: Your health care provider tells you not to drink. You are pregnant, may be pregnant, or are planning to become pregnant. If you drink alcohol: Limit how much you have to 0-1 drink a day. Know how much alcohol is in your drink. In the [place] one drink equals one 12 oz bottle of beer (355 mL), one 5 oz glass of wine (148 mL), or one 1 oz glass of hard liquor (44 mL). Lifestyle Brush your teeth every morning and night with fluoride toothpaste. Floss one time each day. Exercise for at least 30 minutes 5 or more days each week. Do not use any products that contain nicotine or tobacco. These products include cigarettes, chewing tobacco, and vaping devices, such as e-cigarettes. If you need help quitting, ask your health care provider. Do not use drugs. If you are sexually active, practice safe sex. Use a condom or other form of protection to  prevent STIs. If you do not wish to become pregnant, use a form of birth control. If you plan to become pregnant, see your health care provider for a  prepregnancy visit. Take aspirin only as told by your health care provider. Make sure that you understand how much to take and what form to take. Work with your health care provider to find out whether it is safe and beneficial for you to take aspirin daily. Find healthy ways to manage stress, such as: Meditation, yoga, or listening to music. Journaling. Talking to a trusted person. Spending time with friends and family. Minimize exposure to UV radiation to reduce your risk of skin cancer. Safety Always wear your seat belt while driving or riding in a vehicle. Do not drive: If you have been drinking alcohol. Do not ride with someone who has been drinking. When you are tired or distracted. While texting. If you have been using any mind-altering substances or drugs. Wear a helmet and other protective equipment during sports activities. If you have firearms in your house, make sure you follow all gun safety procedures. Seek help if you have been physically or sexually abused. What's next? Visit your health care provider once a year for an annual wellness visit. Ask your health care provider how often you should have your eyes and teeth checked. Stay up to date on all vaccines. This information is not intended to replace advice given to you by your health care provider. Make sure you discuss any questions you have with your health care provider. Document Revised: 04/26/2021 Document Reviewed: 04/26/2021 Elsevier Patient Education  [place].

## 2022-07-13 NOTE — Assessment & Plan Note (Signed)
She is compliant with neurology f/u and with her medications.

## 2022-07-14 LAB — LIPID PANEL
Chol/HDL Ratio: 3.1 ratio (ref 0.0–4.4)
Cholesterol, Total: 188 mg/dL (ref 100–199)
HDL: 60 mg/dL (ref >39)
LDL Chol Calc (NIH): 113 mg/dL — ABNORMAL HIGH (ref 0–99)
Triglycerides: 84 mg/dL (ref 0–149)
VLDL Cholesterol Cal: 15 mg/dL (ref 5–40)

## 2022-07-14 LAB — HEPATITIS C ANTIBODY: Hep C Virus Ab: NONREACTIVE

## 2022-07-15 ENCOUNTER — Encounter: Payer: Self-pay | Admitting: Family Medicine

## 2022-07-18 LAB — CYTOLOGY - PAP
Comment: NEGATIVE
Diagnosis: NEGATIVE
High risk HPV: NEGATIVE

## 2022-07-24 ENCOUNTER — Encounter: Payer: Self-pay | Admitting: Family Medicine

## 2022-08-06 ENCOUNTER — Ambulatory Visit: Payer: 59

## 2022-08-06 NOTE — Progress Notes (Signed)
Office Visit Note   Patient: Diane Harvey           Date of Birth: 1980/11/14           MRN: [id number] Visit Date: 02/21/2022              Requested by: Kinnie Feil, MD [address] [place],  [place] [address] PCP: Kinnie Feil, MD   Assessment & Plan: Visit Diagnoses:  1. Pain in right hip   2. Low back pain, unspecified back pain laterality, unspecified chronicity, unspecified whether sciatica present   3. Chronic right SI joint pain     Plan: Follow-up with me in 4 weeks for recheck.  Blood work drawn today to check a CBC and arthritis panel.  Follow-Up Instructions: Return in about 4 weeks (around 03/21/2022) for with james recheck back pain, review labs and f/u after SI joint injection.   Orders:  Orders Placed This Encounter  Procedures   XR Lumbar Spine 2-3 Views   XR HIP UNILAT W OR W/O PELVIS 2-3 VIEWS RIGHT   CBC   Sed Rate (ESR)   Rheumatoid Factor   Uric acid   Antinuclear Antib (ANA)   Ambulatory referral to Physical Medicine Rehab   Ambulatory referral to Physical Therapy   No orders of the defined types were placed in this encounter.     Procedures: No procedures performed   Clinical Data: No additional findings.   Subjective: Chief Complaint  Patient presents with   Lower Back - New Patient (Initial Visit)    HPI  Review of Systems   Objective: Vital Signs: BP 132/88 (BP Location: Left Arm, Patient Position: Sitting, Cuff Size: Normal)   Pulse 78   Ht 5' 7" (1.702 m)   Wt 224 lb (101.6 kg)   LMP 01/29/2022   BMI 35.08 kg/m   Physical Exam  Ortho Exam  Specialty Comments:  EXAM: MRI LUMBAR SPINE WITHOUT AND WITH CONTRAST   TECHNIQUE: Multiplanar and multiecho pulse sequences of the lumbar spine were obtained without and with intravenous contrast.   CONTRAST:  31m MULTIHANCE GADOBENATE DIMEGLUMINE 529 MG/ML IV SOLN   COMPARISON:  Radiography 10/09/2017   FINDINGS: Segmentation:  5 lumbar type  vertebral bodies.   Alignment:  Normal   Vertebrae:  No fracture or primary lesion.   Conus medullaris and cauda equina: Conus extends to the L1-2 level. Conus and cauda equina appear normal.   Paraspinal and other soft tissues: Normal   Disc levels:   No abnormality at L L3-4 or above. The discs are normal. The canal and foramina are widely patent.   L4-5: No disc abnormality. Bilateral facet osteoarthritis left worse than right. No stenosis. The facet arthropathy could be a cause of back pain or referred facet syndrome pain.   L5-S1: Annular bulging with small annular fissure. No stenosis or neural compression. Minimal facet degeneration at this level.   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 This could be associated with back pain or referred facet syndrome pain. No stenosis or neural compression at this level.   Mild disc degeneration at L5-S1 with annular bulging and a small annular fissure. No stenosis or neural compression. Minimal facet arthritis.     Electronically Signed   By: MNelson ChimesM.D.   On: 12/16/2017 19:30  Imaging: No results found.   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  Unknown name.  Has tried injections with good results.  Plans on returning.  No current problems as  of 03/17/18   New onset seizure ([place]) 01/10/2021   Seizures ([place]) 01/04/2021   Supervision of other normal pregnancy 04/07/2015    Clinic/provider [facility]) Prenatal Labs  Dating LMP and 21w u/s Blood type: O/POS/-- (02/16 1350)   Genetic Screen declined Antibody:NEG (02/16 1350)  Anatomic [place] HC<3rd %ile at 25w Rubella: 2.16 (02/16 1350)  GTT Early: 125               Third trimester: 3 hour normal RPR: NON REAC (05/26 0909)   Flu vaccine Off season HBsAg: NEGATIVE (02/16 1350)   TDaP vaccine 04/07/15                                              Rhogam: not indicated HIV: NONREACTIVE (05/26 0909)   GBS      Negative                                (For PCN allergy, check sensitivities) GBS:NOT DETECTED (07/01 1440)  Contraception BTL - consent signed 04/07/15; Changed mind and wants Depo Pap: 09/26/12 - negative w/ +HR HPV, needs repeat pap postpartum  Baby Food Bottle   Circumcision N/a   Pediatrician [place] [facility]        SVD (spontaneous vaginal delivery)    x 3    Family History  Problem Relation Age of Onset   Hypertension Mother    Hypertension Father    Diabetes Father    Hyperlipidemia Father    Cancer Paternal Grandfather        throat?   Heart disease Neg Hx    Stroke Neg Hx     Past Surgical History:  Procedure Laterality Date   CARPAL TUNNEL RELEASE Bilateral    2 separate surgeries   CESAREAN SECTION N/A 06/09/2015   Procedure: CESAREAN SECTION;  Surgeon: Osborne Oman, MD;  Location: [facility];  Service: Obstetrics;  Laterality: N/A;   LAPAROSCOPIC TUBAL LIGATION Bilateral 03/25/2018   Procedure: LAPAROSCOPIC TUBAL LIGATION;  Surgeon: Emily Filbert, MD;  Location: [facility];  Service: Gynecology;  Laterality: Bilateral;   WISDOM TOOTH EXTRACTION     Social History   Occupational History    Comment: full time   Tobacco Use   Smoking status: Never   Smokeless tobacco: Never  Vaping Use   Vaping Use: Never used  Substance and Sexual  Activity   Alcohol use: No   Drug use: No   Sexual  activity: Yes    Birth control/protection: Surgical    Comment: BTL

## 2022-08-15 ENCOUNTER — Ambulatory Visit (INDEPENDENT_AMBULATORY_CARE_PROVIDER_SITE_OTHER): Payer: Commercial Managed Care - HMO

## 2022-08-15 DIAGNOSIS — Z111 Encounter for screening for respiratory tuberculosis: Secondary | ICD-10-CM

## 2022-08-15 NOTE — Progress Notes (Signed)
Patient presents to nurse clinic for PPD read. PPD placed on 08/15/2022 at 9:30am in left arm. Patient to return on 08/17/2022 to have site read.

## 2022-08-17 ENCOUNTER — Ambulatory Visit (INDEPENDENT_AMBULATORY_CARE_PROVIDER_SITE_OTHER): Payer: Commercial Managed Care - HMO

## 2022-08-17 DIAGNOSIS — Z111 Encounter for screening for respiratory tuberculosis: Secondary | ICD-10-CM

## 2022-08-17 LAB — TB SKIN TEST
Induration: 0 mm
TB Skin Test: NEGATIVE

## 2022-08-17 NOTE — Progress Notes (Signed)
Patient is here for a PPD read.  It was placed on 08/15/2022 in the left forearm @ 9:30 am.    PPD RESULTS:  Result: negative Induration: 0 mm  Letter created and given to patient for documentation purposes. Talbot Grumbling, RN

## 2022-10-22 ENCOUNTER — Telehealth: Payer: Self-pay | Admitting: Adult Health

## 2022-10-22 NOTE — Telephone Encounter (Signed)
Pt is calling. Requesting a refill on medication  levETIRAcetam (KEPPRA) 750 MG tablet . Refill should be sent to [id number]

## 2022-11-23 ENCOUNTER — Other Ambulatory Visit: Payer: Self-pay | Admitting: Family Medicine

## 2022-11-23 DIAGNOSIS — N946 Dysmenorrhea, unspecified: Secondary | ICD-10-CM

## 2022-12-10 ENCOUNTER — Other Ambulatory Visit: Payer: Self-pay

## 2022-12-10 MED ORDER — BACLOFEN 10 MG PO TABS: ORAL_TABLET | ORAL | 1 refills | Status: DC

## 2022-12-18 ENCOUNTER — Other Ambulatory Visit: Payer: Self-pay | Admitting: Family Medicine

## 2022-12-18 MED ORDER — BACLOFEN 10 MG PO TABS: 10.0000 mg | ORAL_TABLET | Freq: Every day | ORAL | 1 refills | Status: DC | PRN

## 2023-01-29 ENCOUNTER — Telehealth: Payer: Self-pay

## 2023-02-05 ENCOUNTER — Ambulatory Visit: Payer: Self-pay | Admitting: Family Medicine

## 2023-02-12 ENCOUNTER — Encounter: Payer: Self-pay | Admitting: Family Medicine

## 2023-02-12 ENCOUNTER — Ambulatory Visit (INDEPENDENT_AMBULATORY_CARE_PROVIDER_SITE_OTHER): Payer: 59 | Admitting: Family Medicine

## 2023-02-12 VITALS — BP 130/85 | HR 86 | Ht 67.0 in | Wt 235.6 lb

## 2023-02-12 DIAGNOSIS — M7661 Achilles tendinitis, right leg: Secondary | ICD-10-CM | POA: Diagnosis not present

## 2023-02-12 DIAGNOSIS — E785 Hyperlipidemia, unspecified: Secondary | ICD-10-CM

## 2023-02-12 DIAGNOSIS — M7662 Achilles tendinitis, left leg: Secondary | ICD-10-CM

## 2023-02-12 NOTE — Progress Notes (Signed)
    SUBJECTIVE:   CHIEF COMPLAINT / HPI:   Foot pain: She is here to f/u for her right heel and Achilles pain,, which started more than six months ago. She is now experiencing similar pain on her left. The pain is about 10/10 in severity, worse with the first few steps in the morning. She uses Tiger balm, foot brace, and NSAID as needed, with mild improvement. She also does home heel exercises.  HLD: Not on meds. Need repeat lab.  PERTINENT  PMH / PSH: PMHx reviewed  OBJECTIVE:   BP 130/85   Pulse 86   Ht 5\' 7"  (1.702 m)   Wt 235 lb 9.6 oz (106.9 kg)   LMP 12/28/2022   SpO2 99%   BMI 36.90 kg/m   Physical Exam Vitals and nursing note reviewed.  Cardiovascular:     Rate and Rhythm: Normal rate and regular rhythm.     Pulses: Normal pulses.     Heart sounds: Normal heart sounds. No murmur heard. Pulmonary:     Effort: Pulmonary effort is normal. No respiratory distress.     Breath sounds: Normal breath sounds.  Feet:     Comments: Tenderness over her achillis B/L, worse on the right. No palpable heel spur. Limited ankle ROM due to pain. No swelling or erythema     ASSESSMENT/PLAN:   Achilles tendinitis B/L. Worse on the right. Continue NSAID prn with home exercise. Referral to podiatrist discussed and she agreed with the plan. Referral placed.   Hyperlipidemia FLP and Cmet ordered. I will contact her soon with her test results.      Andrena Mews, MD [place]

## 2023-02-12 NOTE — Assessment & Plan Note (Signed)
FLP and Cmet ordered. I will contact her soon with her test results.

## 2023-02-12 NOTE — Patient Instructions (Signed)
Plantar Fasciitis  Plantar fasciitis is a painful foot condition that affects the heel. It occurs when the band of tissue that connects the toes to the heel bone (plantar fascia) becomes irritated. This can happen as the result of exercising too much or doing other repetitive activities (overuse injury). Plantar fasciitis can cause mild irritation to severe pain that makes it difficult to walk or move. The pain is usually worse in the morning after sleeping, or after sitting or lying down for a period of time. Pain may also be worse after long periods of walking or standing. What are the causes? This condition may be caused by: Standing for long periods of time. Wearing shoes that do not have good arch support. Doing activities that put stress on joints (high-impact activities). This includes ballet and exercise that makes your heart beat faster (aerobic exercise), such as running. Being overweight. An abnormal way of walking (gait). Tight muscles in the back of your lower leg (calf). High arches in your feet or flat feet. Starting a new athletic activity. What are the signs or symptoms? The main symptom of this condition is heel pain. Pain may get worse after the following: Taking the first steps after a time of rest, especially in the morning after awakening, or after you have been sitting or lying down for a while. Long periods of standing still. Pain may decrease after 30-45 minutes of activity, such as gentle walking. How is this diagnosed? This condition may be diagnosed based on your medical history, a physical exam, and your symptoms. Your health care provider will check for: A tender area on the bottom of your foot. A high arch in your foot or flat feet. Pain when you move your foot. Difficulty moving your foot. You may have imaging tests to confirm the diagnosis, such as: X-rays. Ultrasound. MRI. How is this treated? Treatment for plantar fasciitis depends on how severe your  condition is. Treatment may include: Rest, ice, pressure (compression), and raising (elevating) the affected foot. This is called RICE therapy. Your health care provider may recommend RICE therapy along with over-the-counter pain medicines to manage your pain. Exercises to stretch your calves and your plantar fascia. A splint that holds your foot in a stretched, upward position while you sleep (night splint). Physical therapy to relieve symptoms and prevent problems in the future. Injections of steroid medicine (cortisone) to relieve pain and inflammation. Stimulating your plantar fascia with electrical impulses (extracorporeal shock wave therapy). This is usually the last treatment option before surgery. Surgery, if other treatments have not worked after 12 months. Follow these instructions at home: Managing pain, stiffness, and swelling  If directed, put ice on the painful area. To do this: Put ice in a plastic bag, or use a frozen bottle of water. Place a towel between your skin and the bag or bottle. Roll the bottom of your foot over the bag or bottle. Do this for 20 minutes, 2-3 times a day. Wear athletic shoes that have air-sole or gel-sole cushions, or try soft shoe inserts that are designed for plantar fasciitis. Elevate your foot above the level of your heart while you are sitting or lying down. Activity Avoid activities that cause pain. Ask your health care provider what activities are safe for you. Do physical therapy exercises and stretches as told by your health care provider. Try activities and forms of exercise that are easier on your joints (low impact). Examples include swimming, water aerobics, and biking. General instructions Take over-the-counter   and prescription medicines only as told by your health care provider. Wear a night splint while sleeping, if told by your health care provider. Loosen the splint if your toes tingle, become numb, or turn cold and blue. Maintain a  healthy weight, or work with your health care provider to lose weight as needed. Keep all follow-up visits. This is important. Contact a health care provider if you have: Symptoms that do not go away with home treatment. Pain that gets worse. Pain that affects your ability to move or do daily activities. Summary Plantar fasciitis is a painful foot condition that affects the heel. It occurs when the band of tissue that connects the toes to the heel bone (plantar fascia) becomes irritated. Heel pain is the main symptom of this condition. It may get worse after exercising too much or standing still for a long time. Treatment varies, but it usually starts with rest, ice, pressure (compression), and raising (elevating) the affected foot. This is called RICE therapy. Over-the-counter medicines can also be used to manage pain. This information is not intended to replace advice given to you by your health care provider. Make sure you discuss any questions you have with your health care provider. Document Revised: 02/15/2020 Document Reviewed: 02/15/2020 Elsevier Patient Education  2023 Elsevier Inc.  

## 2023-02-12 NOTE — Assessment & Plan Note (Signed)
B/L. Worse on the right. Continue NSAID prn with home exercise. Referral to podiatrist discussed and she agreed with the plan. Referral placed.

## 2023-02-13 LAB — CMP14+EGFR
ALT: 9 IU/L (ref 0–32)
AST: 13 IU/L (ref 0–40)
Albumin/Globulin Ratio: 1.1 — ABNORMAL LOW (ref 1.2–2.2)
Albumin: 3.9 g/dL (ref 3.9–4.9)
Alkaline Phosphatase: 61 IU/L (ref 44–121)
BUN/Creatinine Ratio: 10 (ref 9–23)
BUN: 7 mg/dL (ref 6–24)
Bilirubin Total: 0.4 mg/dL (ref 0.0–1.2)
CO2: 24 mmol/L (ref 20–29)
Calcium: 9.3 mg/dL (ref 8.7–10.2)
Chloride: 103 mmol/L (ref 96–106)
Creatinine, Ser: 0.72 mg/dL (ref 0.57–1.00)
Globulin, Total: 3.6 g/dL (ref 1.5–4.5)
Glucose: 77 mg/dL (ref 70–99)
Potassium: 3.7 mmol/L (ref 3.5–5.2)
Sodium: 141 mmol/L (ref 134–144)
Total Protein: 7.5 g/dL (ref 6.0–8.5)
eGFR: 106 mL/min/{1.73_m2} (ref >59)

## 2023-02-13 LAB — LIPID PANEL
Chol/HDL Ratio: 3.1 ratio (ref 0.0–4.4)
Cholesterol, Total: 180 mg/dL (ref 100–199)
HDL: 59 mg/dL (ref >39)
LDL Chol Calc (NIH): 104 mg/dL — ABNORMAL HIGH (ref 0–99)
Triglycerides: 96 mg/dL (ref 0–149)
VLDL Cholesterol Cal: 17 mg/dL (ref 5–40)

## 2023-02-27 ENCOUNTER — Ambulatory Visit (INDEPENDENT_AMBULATORY_CARE_PROVIDER_SITE_OTHER): Payer: 59

## 2023-02-27 ENCOUNTER — Ambulatory Visit (INDEPENDENT_AMBULATORY_CARE_PROVIDER_SITE_OTHER): Payer: 59 | Admitting: Podiatry

## 2023-02-27 DIAGNOSIS — M7661 Achilles tendinitis, right leg: Secondary | ICD-10-CM

## 2023-02-27 DIAGNOSIS — M62461 Contracture of muscle, right lower leg: Secondary | ICD-10-CM

## 2023-02-27 DIAGNOSIS — M7662 Achilles tendinitis, left leg: Secondary | ICD-10-CM | POA: Diagnosis not present

## 2023-02-27 DIAGNOSIS — M778 Other enthesopathies, not elsewhere classified: Secondary | ICD-10-CM | POA: Diagnosis not present

## 2023-02-27 DIAGNOSIS — M7750 Other enthesopathy of unspecified foot: Secondary | ICD-10-CM

## 2023-02-27 DIAGNOSIS — M62462 Contracture of muscle, left lower leg: Secondary | ICD-10-CM | POA: Diagnosis not present

## 2023-02-27 MED ORDER — IBUPROFEN 800 MG PO TABS: 800.0000 mg | ORAL_TABLET | Freq: Three times a day (TID) | ORAL | 0 refills | Status: DC | PRN

## 2023-02-27 NOTE — Patient Instructions (Signed)

## 2023-03-02 NOTE — Progress Notes (Signed)
  Subjective:  Patient ID: Diane Harvey, female    DOB: 1981-05-12,  MRN: [id number]  Chief Complaint  Patient presents with   Tendonitis    Bilateral Achilles pain, right worse than left    42 y.o. female presents with the above complaint. History confirmed with patient.  Does not recall any injury.  Pain in the back of the heel begins when she begins moving has been going on for some time  Objective:  Physical Exam: warm, good capillary refill, no trophic changes or ulcerative lesions, normal DP and PT pulses, and normal sensory exam. Left Foot: tenderness at Achilles tendon insertion and gastrocnemius equinus is noted with a positive silverskiold test Right Foot: tenderness at Achilles tendon insertion and gastrocnemius equinus is noted with a positive silverskiold test  No images are attached to the encounter.  Radiographs: Multiple views x-ray of both feet: Plantar and posterior calcaneal spur noted on the left, she has Haglund deformity bilateral, no fracture or stress fracture Assessment:   1. Achilles tendinitis of both lower extremities   2. Gastrocnemius equinus of left lower extremity   3. Gastrocnemius equinus of right lower extremity      Plan:  Patient was evaluated and treated and all questions answered.   Discussed the etiology and treatment options for Achilles tendinitis including stretching, formal physical therapy with an eccentric exercises therapy plan, supportive shoegears such as a running shoe or sneaker, heel lifts, topical and oral medications.  We also discussed that I do not routinely perform injections in this area because of the risk of an increased damage or rupture of the tendon.  We also discussed the role of surgical treatment of this for patients who do not improve after exhausting non-surgical treatment options.  -XR reviewed with patient -Educated on stretching and icing of the affected limb. -Referral placed to physical therapy. -She was  referred to [facility] in [place] -Ibuprofen 800 milligrams sent to pharmacy use as needed 3 times daily -Also recommended Voltaren gel. -Recommended mobilization and CAM boot on the right which is severe, she may wear regular athletic shoes on the left   No follow-ups on file.

## 2023-04-01 ENCOUNTER — Ambulatory Visit: Payer: 59 | Admitting: Podiatry

## 2023-04-01 DIAGNOSIS — M7662 Achilles tendinitis, left leg: Secondary | ICD-10-CM

## 2023-04-01 DIAGNOSIS — M7661 Achilles tendinitis, right leg: Secondary | ICD-10-CM | POA: Diagnosis not present

## 2023-04-01 DIAGNOSIS — M62461 Contracture of muscle, right lower leg: Secondary | ICD-10-CM | POA: Diagnosis not present

## 2023-04-01 DIAGNOSIS — M9261 Juvenile osteochondrosis of tarsus, right ankle: Secondary | ICD-10-CM | POA: Diagnosis not present

## 2023-04-01 MED ORDER — METHYLPREDNISOLONE 4 MG PO TBPK: ORAL_TABLET | ORAL | 0 refills | Status: DC

## 2023-04-01 NOTE — Patient Instructions (Addendum)
Call to schedule physical therapy: [facility] at [facility] [address] [place]  [phone]   Call [facility] to schedule your MRI at the below locations.  Please allow at least 1 business day after your visit to process the referral.  It may take longer depending on approval from insurance.  Please let me know if you have issues or problems scheduling the MRI   [facility]  [phone] [address] [place] [address] [place], [place] [address]  [facility] [phone] [place], [place] [address]

## 2023-04-01 NOTE — Progress Notes (Signed)
  Subjective:  Patient ID: Diane Harvey, female    DOB: 10/25/1981,  MRN: [id number]  Chief Complaint  Patient presents with   Tendonitis    4 week follow up Achilles tendonitis bilateral    42 y.o. female presents with the above complaint. History confirmed with patient.  She returns for follow-up, physical therapy did not schedule [facility] at [facility].  Has been using the boot.  Has not had any improvement.  Still taking ibuprofen and her milligrams.  Objective:  Physical Exam: warm, good capillary refill, no trophic changes or ulcerative lesions, normal DP and PT pulses, and normal sensory exam. Left Foot: Minimal pain to palpation today Right Foot: tenderness at Achilles tendon insertion and gastrocnemius equinus is noted with a positive silverskiold test  No images are attached to the encounter.  Radiographs: Multiple views x-ray of both feet: Plantar and posterior calcaneal spur noted on the left, she has Haglund deformity bilateral, no fracture or stress fracture Assessment:   1. Achilles tendinitis of both lower extremities   2. Gastrocnemius equinus of right lower extremity   3. Haglund's deformity of right heel      Plan:  Patient was evaluated and treated and all questions answered.   Discussed the etiology and treatment options for Achilles tendinitis including stretching, formal physical therapy with an eccentric exercises therapy plan, supportive shoegears such as a running shoe or sneaker, heel lifts, topical and oral medications.  We also discussed that I do not routinely perform injections in this area because of the risk of an increased damage or rupture of the tendon.  We also discussed the role of surgical treatment of this for patients who do not improve after exhausting non-surgical treatment options.  -Right side continues to be quite bothersome.  She has had no relief from immobilization anti-inflammatories and home therapy.  I recommend an MRI to evaluate for  tears and/or bursitis or inflammation within the Haglund deformity or posterior enthesophyte -Continue boot immobilization -New referral for [facility] sent, number given so that she can call to schedule -Methylprednisolone taper Rx sent to pharmacy -Continue topical Voltaren gel 3-4 times daily   Return in about 6 weeks (around 05/13/2023) for re-check Achilles tendon, after MRI to review.

## 2023-04-10 ENCOUNTER — Other Ambulatory Visit: Payer: Self-pay | Admitting: Family Medicine

## 2023-04-10 ENCOUNTER — Other Ambulatory Visit: Payer: Self-pay | Admitting: Podiatry

## 2023-04-11 ENCOUNTER — Encounter: Payer: Self-pay | Admitting: Podiatry

## 2023-04-11 NOTE — Telephone Encounter (Signed)
This was already ordered today by Dr. Lum Babe, therefore we will not authorize refill.

## 2023-04-15 ENCOUNTER — Encounter: Payer: Self-pay | Admitting: Podiatry

## 2023-04-17 ENCOUNTER — Encounter: Payer: Self-pay | Admitting: Family Medicine

## 2023-04-18 ENCOUNTER — Other Ambulatory Visit: Payer: 59

## 2023-05-13 ENCOUNTER — Telehealth: Payer: Self-pay | Admitting: Family Medicine

## 2023-05-13 ENCOUNTER — Ambulatory Visit: Payer: 59 | Attending: Podiatry | Admitting: Physical Therapy

## 2023-05-13 NOTE — Telephone Encounter (Signed)
Patient dropped off form at front desk for Record of Tuberculosis Screening .  Verified that patient section of form has been completed.  Last DOS/WCC with PCP was 02/12/23.  Placed form in blue team folder to be completed by clinical staff.  Vilinda Blanks

## 2023-05-13 NOTE — Telephone Encounter (Signed)
Clinical info completed on TB Screening form.  Placed form in PCP's box for completion.    When form is completed, please route note to "RN Team" and place in wall pocket in front office.   Aquilla Solian, CMA

## 2023-05-14 NOTE — Telephone Encounter (Signed)
Form placed up front for pick up.   Copy made for batch scanning.   Patient aware.  

## 2023-05-15 ENCOUNTER — Ambulatory Visit: Payer: 59 | Admitting: Podiatry

## 2023-05-29 ENCOUNTER — Other Ambulatory Visit: Payer: 59

## 2023-06-05 ENCOUNTER — Other Ambulatory Visit: Payer: Self-pay | Admitting: Podiatry

## 2023-06-17 ENCOUNTER — Ambulatory Visit: Payer: 59 | Admitting: Neurology

## 2023-06-17 ENCOUNTER — Ambulatory Visit: Payer: 59 | Admitting: Adult Health

## 2023-06-17 ENCOUNTER — Encounter: Payer: Self-pay | Admitting: Neurology

## 2023-07-02 ENCOUNTER — Other Ambulatory Visit: Payer: Self-pay | Admitting: Family Medicine

## 2023-07-03 ENCOUNTER — Other Ambulatory Visit: Payer: Self-pay | Admitting: Adult Health

## 2023-07-03 ENCOUNTER — Other Ambulatory Visit: Payer: Self-pay | Admitting: Podiatry

## 2023-07-03 DIAGNOSIS — G40909 Epilepsy, unspecified, not intractable, without status epilepticus: Secondary | ICD-10-CM

## 2023-07-03 DIAGNOSIS — G9389 Other specified disorders of brain: Secondary | ICD-10-CM

## 2023-07-09 ENCOUNTER — Other Ambulatory Visit: Payer: Self-pay | Admitting: Adult Health

## 2023-07-09 DIAGNOSIS — G40909 Epilepsy, unspecified, not intractable, without status epilepticus: Secondary | ICD-10-CM

## 2023-07-09 DIAGNOSIS — G9389 Other specified disorders of brain: Secondary | ICD-10-CM

## 2023-07-17 ENCOUNTER — Other Ambulatory Visit: Payer: Self-pay | Admitting: Family Medicine

## 2023-07-27 ENCOUNTER — Other Ambulatory Visit: Payer: Self-pay | Admitting: Family Medicine

## 2023-07-27 DIAGNOSIS — N946 Dysmenorrhea, unspecified: Secondary | ICD-10-CM

## 2023-07-28 ENCOUNTER — Other Ambulatory Visit: Payer: Self-pay | Admitting: Adult Health

## 2023-07-28 DIAGNOSIS — G40909 Epilepsy, unspecified, not intractable, without status epilepticus: Secondary | ICD-10-CM

## 2023-07-28 DIAGNOSIS — G9389 Other specified disorders of brain: Secondary | ICD-10-CM

## 2023-07-30 ENCOUNTER — Other Ambulatory Visit: Payer: Self-pay

## 2023-08-06 ENCOUNTER — Other Ambulatory Visit: Payer: Self-pay | Admitting: Adult Health

## 2023-08-06 DIAGNOSIS — G40909 Epilepsy, unspecified, not intractable, without status epilepticus: Secondary | ICD-10-CM

## 2023-08-06 DIAGNOSIS — G9389 Other specified disorders of brain: Secondary | ICD-10-CM

## 2023-08-08 ENCOUNTER — Encounter: Payer: Self-pay | Admitting: Adult Health

## 2023-08-08 ENCOUNTER — Telehealth (INDEPENDENT_AMBULATORY_CARE_PROVIDER_SITE_OTHER): Payer: 59 | Admitting: Adult Health

## 2023-08-08 DIAGNOSIS — R569 Unspecified convulsions: Secondary | ICD-10-CM

## 2023-08-08 DIAGNOSIS — G9389 Other specified disorders of brain: Secondary | ICD-10-CM | POA: Diagnosis not present

## 2023-08-08 DIAGNOSIS — G40909 Epilepsy, unspecified, not intractable, without status epilepticus: Secondary | ICD-10-CM

## 2023-08-08 MED ORDER — LEVETIRACETAM ER 750 MG PO TB24: 1500.0000 mg | ORAL_TABLET | Freq: Every day | ORAL | 11 refills | Status: DC
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
## 2023-08-09 ENCOUNTER — Telehealth: Payer: Self-pay | Admitting: Adult Health

## 2023-08-09 NOTE — Telephone Encounter (Signed)
sent to GI they obtain [employer] auth [id number] 

## 2023-09-08 ENCOUNTER — Other Ambulatory Visit: Payer: Self-pay | Admitting: Adult Health

## 2023-09-08 ENCOUNTER — Other Ambulatory Visit: Payer: Self-pay | Admitting: Family Medicine

## 2023-09-08 DIAGNOSIS — G9389 Other specified disorders of brain: Secondary | ICD-10-CM

## 2023-09-08 DIAGNOSIS — G40909 Epilepsy, unspecified, not intractable, without status epilepticus: Secondary | ICD-10-CM

## 2023-09-15 ENCOUNTER — Other Ambulatory Visit: Payer: 59

## 2023-09-22 ENCOUNTER — Other Ambulatory Visit: Payer: Self-pay | Admitting: Family Medicine

## 2023-10-08 ENCOUNTER — Other Ambulatory Visit: Payer: Self-pay | Admitting: Adult Health

## 2023-10-08 DIAGNOSIS — G9389 Other specified disorders of brain: Secondary | ICD-10-CM

## 2023-10-08 DIAGNOSIS — G40909 Epilepsy, unspecified, not intractable, without status epilepticus: Secondary | ICD-10-CM

## 2023-10-11 ENCOUNTER — Other Ambulatory Visit: Payer: Self-pay | Admitting: Neurology

## 2023-10-12 ENCOUNTER — Ambulatory Visit
Admission: RE | Admit: 2023-10-12 | Discharge: 2023-10-12 | Disposition: A | Discharge status: Home / Self Care | Payer: 59 | Source: Ambulatory Visit | Attending: Adult Health | Admitting: Adult Health

## 2023-10-12 DIAGNOSIS — G9389 Other specified disorders of brain: Secondary | ICD-10-CM

## 2023-10-12 DIAGNOSIS — G40909 Epilepsy, unspecified, not intractable, without status epilepticus: Secondary | ICD-10-CM

## 2023-10-12 MED ORDER — GADOPICLENOL 0.5 MMOL/ML IV SOLN
10.0000 mL | Freq: Once | INTRAVENOUS | Status: AC | PRN
  Administered 2023-10-12: 10 mL via INTRAVENOUS

## 2023-10-13 ENCOUNTER — Telehealth: Payer: Self-pay | Admitting: Neurology

## 2023-10-13 NOTE — Telephone Encounter (Signed)
Patient paged on-call November 29 in the morning, stated that she could not get her Keppra extended release says needed a prior approval but she had been picking it up.  I contacted the pharmacy and they had no idea what the problem was the medication was waiting in the been.  I had the phone service let her know.  Please ensure that she has enough Keppra extended release until her next appointment and that there are no problems.

## 2023-10-14 NOTE — Telephone Encounter (Signed)
Can you please follow up on this?  She was seen back in September and provided 1 year refill at that time.  Please ensure she has been able to refill her Keppra XR, she has been requesting refills monthly but unsure why as she should have adequate refills at her pharmacy. Thank you.

## 2023-11-04 ENCOUNTER — Encounter: Payer: Self-pay | Admitting: Podiatry

## 2023-12-23 ENCOUNTER — Other Ambulatory Visit: Payer: Self-pay | Admitting: Family Medicine

## 2023-12-23 DIAGNOSIS — N946 Dysmenorrhea, unspecified: Secondary | ICD-10-CM

## 2023-12-27 ENCOUNTER — Other Ambulatory Visit: Payer: Self-pay | Admitting: Family Medicine

## 2024-01-31 ENCOUNTER — Telehealth: Admitting: Family Medicine

## 2024-01-31 DIAGNOSIS — H00019 Hordeolum externum unspecified eye, unspecified eyelid: Secondary | ICD-10-CM | POA: Diagnosis not present

## 2024-01-31 MED ORDER — OFLOXACIN 0.3 % OP SOLN: 1.0000 [drp] | Freq: Four times a day (QID) | OPHTHALMIC | 0 refills | Status: AC

## 2024-01-31 NOTE — Progress Notes (Signed)
  E-Visit for Stye   We are sorry that you are not feeling well. Here is how we plan to help!  Based on what you have shared with me it looks like you have a stye.  A stye is an inflammation of the eyelid.  It is often a red, painful lump near the edge of the eyelid that may look like a boil or a pimple.  A stye develops when an infection occurs at the base of an eyelash.   We have made appropriate suggestions for you based upon your presentation:  I have sent ofloxacin drops.  Wash your hands often! Let the stye open on its own. Don't squeeze or open it. Don't rub your eyes. This can irritate your eyes and let in bacteria.  If you need to touch your eyes, wash your hands first. Don't wear eye makeup or contact lenses until the area has healed.  GET HELP RIGHT AWAY IF:  Your symptoms do not improve. You develop blurred or loss of vision. Your symptoms worsen (increased discharge, pain or redness).   Thank you for choosing an e-visit.  Your e-visit answers were reviewed by a board certified advanced clinical practitioner to complete your personal care plan. Depending upon the condition, your plan could have included both over the counter or prescription medications.  Please review your pharmacy choice. Make sure the pharmacy is open so you can pick up prescription now. If there is a problem, you may contact your provider through [employer] and have the prescription routed to another pharmacy.  Your safety is important to [place]. If you have drug allergies check your prescription carefully.   For the next 24 hours you can use MyChart to ask questions about today's visit, request a non-urgent call back, or ask for a work or school excuse. You will get an email in the next two days asking about your experience. I hope that your e-visit has been valuable and will speed your recovery.    have provided 5 minutes of non face to face time during this encounter for chart review and  documentation.

## 2024-01-31 NOTE — Progress Notes (Signed)

## 2024-02-03 ENCOUNTER — Telehealth: Admitting: Physician Assistant

## 2024-02-03 DIAGNOSIS — L03213 Periorbital cellulitis: Secondary | ICD-10-CM

## 2024-02-03 MED ORDER — AMOXICILLIN-POT CLAVULANATE 875-125 MG PO TABS: 1.0000 | ORAL_TABLET | Freq: Two times a day (BID) | ORAL | 0 refills | Status: DC

## 2024-02-03 NOTE — Progress Notes (Signed)
  We are sorry that you are not feeling well. Here is how we plan to help!  Based on what you have shared with me it looks like you have possible preseptal cellulitis.  We have made appropriate suggestions for you based upon your presentation: Augmentin 875-125mg  Take 1 tablet twice daily for 7 days   HOME CARE:  Wash your hands often! Let the stye open on its own. Don't squeeze or open it. Don't rub your eyes. This can irritate your eyes and let in bacteria.  If you need to touch your eyes, wash your hands first. Don't wear eye makeup or contact lenses until the area has healed.  GET HELP RIGHT AWAY IF:  Your symptoms do not improve. You develop blurred or loss of vision. Your symptoms worsen (increased discharge, pain or redness).   Thank you for choosing an e-visit.  Your e-visit answers were reviewed by a board certified advanced clinical practitioner to complete your personal care plan. Depending upon the condition, your plan could have included both over the counter or prescription medications.  Please review your pharmacy choice. Make sure the pharmacy is open so you can pick up prescription now. If there is a problem, you may contact your provider through [employer] and have the prescription routed to another pharmacy.  Your safety is important to [place]. If you have drug allergies check your prescription carefully.   For the next 24 hours you can use MyChart to ask questions about today's visit, request a non-urgent call back, or ask for a work or school excuse. You will get an email in the next two days asking about your experience. I hope that your e-visit has been valuable and will speed your recovery.   I have spent 5 minutes in review of e-visit questionnaire, review and updating patient chart, medical decision making and response to patient.   Margaretann Loveless, PA-C

## 2024-03-01 ENCOUNTER — Telehealth: Admitting: Physician Assistant

## 2024-03-01 DIAGNOSIS — W57XXXA Bitten or stung by nonvenomous insect and other nonvenomous arthropods, initial encounter: Secondary | ICD-10-CM | POA: Diagnosis not present

## 2024-03-01 DIAGNOSIS — L259 Unspecified contact dermatitis, unspecified cause: Secondary | ICD-10-CM | POA: Diagnosis not present

## 2024-03-01 MED ORDER — SULFAMETHOXAZOLE-TRIMETHOPRIM 800-160 MG PO TABS: 1.0000 | ORAL_TABLET | Freq: Two times a day (BID) | ORAL | 0 refills | Status: DC

## 2024-03-01 NOTE — Progress Notes (Signed)
 I have spent 5 minutes in review of e-visit questionnaire, review and updating patient chart, medical decision making and response to patient.   Laure Kidney, PA-C

## 2024-03-01 NOTE — Progress Notes (Signed)
 E-Visit for Insect Sting  Thank you for describing the insect sting for us .  Here is how we plan to help!  Based on the information you have shared with me it looks like you have: an insect bite. I have prescribed bactrim  an antibiotic for treatment.   The 2 greatest risks from insect stings are allergic reaction, which can be fatal in some people and infection, which is more common and less serious.  Bees, wasps, yellow jackets, and hornets belong to a class of insects called Hymenoptera.  Most insect stings cause only minor discomfort.  Stings can happen anywhere on the body and can be painful.  Most stings are from honey bees or yellow jackets.  Fire ants can sting multiple times.  The sites of the stings are more likely to become infected.    Based on your information I have:  What can be used to prevent Insect Stings?  Insect repellant with at least 20% DEET.  Wearing long pants and shirts with socks and shoes.  Wear dark or drab-colored clothes rather than bright colors.  Avoid using perfumes and hair sprays; these attract insects.  HOME CARE ADVICE:  1. Stinger removal: The stinger looks like a tiny black dot in the sting. Use a fingernail, credit card edge, or knife-edge to scrape it off.  Don't pull it out because it squeezes out more venom. If the stinger is below the skin surface, leave it alone.  It will be shed with normal skin healing. 2. Use cold compresses to the area of the sting for 10-20 minutes.  You may repeat this as needed to relieve symptoms of pain and swelling. 3.  For pain relief, take acetominophen 650 mg 4-6 hours as needed or ibuprofen  400 mg every 6-8 hours as needed or naproxen  250-500 mg every 12 hours as needed. 4.  You can also use hydrocortisone  cream 0.5% or 1% up to 4 times daily as needed for itching. 5.  If the sting becomes very itchy, take Benadryl  25-50 mg, follow directions on box. 6.  Wash the area 2-3 times daily with antibacterial soap  and warm water. 7. Call your Doctor if: Fever, a severe headache, or rash occur in the next 2 weeks. Sting area begins to look infected. Redness and swelling worsens after home treatment. Your current symptoms become worse.    MAKE SURE YOU:  Understand these instructions. Will watch your condition. Will get help right away if you are not doing well or get worse.  Thank you for choosing an e-visit.  Your e-visit answers were reviewed by a board certified advanced clinical practitioner to complete your personal care plan. Depending upon the condition, your plan could have included both over the counter or prescription medications.  Please review your pharmacy choice. Make sure the pharmacy is open so you can pick up prescription now. If there is a problem, you may contact your provider through [employer] and have the prescription routed to another pharmacy.  Your safety is important to us . If you have drug allergies check your prescription carefully.   For the next 24 hours you can use MyChart to ask questions about today's visit, request a non-urgent call back, or ask for a work or school excuse. You will get an email in the next two days asking about your experience. I hope that your e-visit has been valuable and will speed your recovery.

## 2024-03-04 ENCOUNTER — Other Ambulatory Visit: Payer: Self-pay

## 2024-03-04 ENCOUNTER — Other Ambulatory Visit: Payer: Self-pay | Admitting: Adult Health

## 2024-03-04 ENCOUNTER — Telehealth: Admitting: Physician Assistant

## 2024-03-04 ENCOUNTER — Other Ambulatory Visit: Payer: Self-pay | Admitting: Family Medicine

## 2024-03-04 DIAGNOSIS — G40909 Epilepsy, unspecified, not intractable, without status epilepticus: Secondary | ICD-10-CM

## 2024-03-04 DIAGNOSIS — G9389 Other specified disorders of brain: Secondary | ICD-10-CM

## 2024-03-04 DIAGNOSIS — L0390 Cellulitis, unspecified: Secondary | ICD-10-CM

## 2024-03-04 DIAGNOSIS — T63441A Toxic effect of venom of bees, accidental (unintentional), initial encounter: Secondary | ICD-10-CM

## 2024-03-04 MED ORDER — IBUPROFEN 400 MG PO TABS: 400.0000 mg | ORAL_TABLET | Freq: Two times a day (BID) | ORAL | 0 refills | Status: DC | PRN

## 2024-03-04 MED ORDER — CEPHALEXIN 500 MG PO CAPS: 500.0000 mg | ORAL_CAPSULE | Freq: Four times a day (QID) | ORAL | 0 refills | Status: DC

## 2024-03-04 NOTE — Progress Notes (Signed)
 E-Visit for Insect Sting  Thank you for describing the insect sting for us .  Here is how we plan to help!  Based on the information you have shared with me it looks like you have: An infected or complicated stinger that requires antibiotics.  Bees, wasps, yellow jackets, and hornets belong to a class of insects called Hymenoptera.  Most insect stings cause only minor discomfort.  Stings can happen anywhere on the body and can be painful.  Most stings are from honey bees or yellow jackets.  Fire ants can sting multiple times.  The sites of the stings are more likely to become infected.    Based on your information I have: and Your symptoms indicate that you need a longer course of antibiotics and a follow up visit with a provider.  I have sent Keflex  500mg  Take one tablet four times daily (every 6 hours while awake) for 5 days.  What can be used to prevent Insect Stings?  Insect repellant with at least 20% DEET.  Wearing long pants and shirts with socks and shoes.  Wear dark or drab-colored clothes rather than bright colors.  Avoid using perfumes and hair sprays; these attract insects.  HOME CARE ADVICE:  1. Stinger removal: The stinger looks like a tiny black dot in the sting. Use a fingernail, credit card edge, or knife-edge to scrape it off.  Don't pull it out because it squeezes out more venom. If the stinger is below the skin surface, leave it alone.  It will be shed with normal skin healing. 2. Use cold compresses to the area of the sting for 10-20 minutes.  You may repeat this as needed to relieve symptoms of pain and swelling. 3.  For pain relief, take acetominophen 650 mg 4-6 hours as needed or ibuprofen  400 mg every 6-8 hours as needed or naproxen  250-500 mg every 12 hours as needed. 4.  You can also use hydrocortisone  cream 0.5% or 1% up to 4 times daily as needed for itching. 5.  If the sting becomes very itchy, take Benadryl  25-50 mg, follow directions on box. 6.  Wash the  area 2-3 times daily with antibacterial soap and warm water. 7. Call your Doctor if: Fever, a severe headache, or rash occur in the next 2 weeks. Sting area begins to look infected. Redness and swelling worsens after home treatment. Your current symptoms become worse.    MAKE SURE YOU:  Understand these instructions. Will watch your condition. Will get help right away if you are not doing well or get worse.  Thank you for choosing an e-visit.  Your e-visit answers were reviewed by a board certified advanced clinical practitioner to complete your personal care plan. Depending upon the condition, your plan could have included both over the counter or prescription medications.  Please review your pharmacy choice. Make sure the pharmacy is open so you can pick up prescription now. If there is a problem, you may contact your provider through [employer] and have the prescription routed to another pharmacy.  Your safety is important to us . If you have drug allergies check your prescription carefully.   For the next 24 hours you can use MyChart to ask questions about today's visit, request a non-urgent call back, or ask for a work or school excuse. You will get an email in the next two days asking about your experience. I hope that your e-visit has been valuable and will speed your recovery.    I have spent  5 minutes in review of e-visit questionnaire, review and updating patient chart, medical decision making and response to patient.   Angelia Kelp, PA-C

## 2024-03-04 NOTE — Telephone Encounter (Signed)
 Patient calls nurse line requesting a refill on Ibuprofen .  She reports she did an e-visit over the weekend for an insect bite.   She reports she started antibiotics, however still feels pain when she walks.   She denies any worsening symptoms. No fevers, chills, body aches or drainage.  Patient advised to schedule an apt for evaluation. Advised she has not been seen in over one year, unsure if PCP will be willing to refill medication.   Patient stated she will call back if symptoms worsen for an apt.   ED precautions discussed.   Will forward to PCP.

## 2024-03-05 ENCOUNTER — Emergency Department (HOSPITAL_BASED_OUTPATIENT_CLINIC_OR_DEPARTMENT_OTHER)
Admission: EM | Admit: 2024-03-05 | Discharge: 2024-03-06 | Disposition: A | Discharge status: Home / Self Care | Attending: Emergency Medicine | Admitting: Emergency Medicine

## 2024-03-05 ENCOUNTER — Encounter (HOSPITAL_BASED_OUTPATIENT_CLINIC_OR_DEPARTMENT_OTHER): Payer: Self-pay | Admitting: Emergency Medicine

## 2024-03-05 ENCOUNTER — Other Ambulatory Visit: Payer: Self-pay

## 2024-03-05 DIAGNOSIS — D72829 Elevated white blood cell count, unspecified: Secondary | ICD-10-CM | POA: Diagnosis not present

## 2024-03-05 DIAGNOSIS — W57XXXA Bitten or stung by nonvenomous insect and other nonvenomous arthropods, initial encounter: Secondary | ICD-10-CM | POA: Diagnosis not present

## 2024-03-05 DIAGNOSIS — S80861A Insect bite (nonvenomous), right lower leg, initial encounter: Secondary | ICD-10-CM | POA: Diagnosis present

## 2024-03-05 DIAGNOSIS — L03115 Cellulitis of right lower limb: Secondary | ICD-10-CM | POA: Insufficient documentation

## 2024-03-05 DIAGNOSIS — E876 Hypokalemia: Secondary | ICD-10-CM | POA: Diagnosis not present

## 2024-03-05 LAB — COMPREHENSIVE METABOLIC PANEL WITH GFR
ALT: 6 U/L (ref 0–44)
AST: 17 U/L (ref 15–41)
Albumin: 4.3 g/dL (ref 3.5–5.0)
Alkaline Phosphatase: 58 U/L (ref 38–126)
Anion gap: 12 (ref 5–15)
BUN: 7 mg/dL (ref 6–20)
CO2: 24 mmol/L (ref 22–32)
Calcium: 9.3 mg/dL (ref 8.9–10.3)
Chloride: 103 mmol/L (ref 98–111)
Creatinine, Ser: 0.86 mg/dL (ref 0.44–1.00)
GFR, Estimated: >60 mL/min (ref >60)
Glucose, Bld: 124 mg/dL — ABNORMAL HIGH (ref 70–99)
Potassium: 3.2 mmol/L — ABNORMAL LOW (ref 3.5–5.1)
Sodium: 139 mmol/L (ref 135–145)
Total Bilirubin: 0.7 mg/dL (ref 0.0–1.2)
Total Protein: 7.9 g/dL (ref 6.5–8.1)

## 2024-03-05 LAB — CBC WITH DIFFERENTIAL/PLATELET
Abs Immature Granulocytes: 0.04 10*3/uL (ref 0.00–0.07)
Basophils Absolute: 0 10*3/uL (ref 0.0–0.1)
Basophils Relative: 0 %
Eosinophils Absolute: 0.1 10*3/uL (ref 0.0–0.5)
Eosinophils Relative: 1 %
HCT: 39 % (ref 36.0–46.0)
Hemoglobin: 12.4 g/dL (ref 12.0–15.0)
Immature Granulocytes: 0 %
Lymphocytes Relative: 15 %
Lymphs Abs: 1.8 10*3/uL (ref 0.7–4.0)
MCH: 24.9 pg — ABNORMAL LOW (ref 26.0–34.0)
MCHC: 31.8 g/dL (ref 30.0–36.0)
MCV: 78.3 fL — ABNORMAL LOW (ref 80.0–100.0)
Monocytes Absolute: 0.7 10*3/uL (ref 0.1–1.0)
Monocytes Relative: 5 %
Neutro Abs: 9.6 10*3/uL — ABNORMAL HIGH (ref 1.7–7.7)
Neutrophils Relative %: 79 %
Platelets: 396 10*3/uL (ref 150–400)
RBC: 4.98 MIL/uL (ref 3.87–5.11)
RDW: 15.9 % — ABNORMAL HIGH (ref 11.5–15.5)
WBC: 12.2 10*3/uL — ABNORMAL HIGH (ref 4.0–10.5)
nRBC: 0 % (ref 0.0–0.2)

## 2024-03-05 MED ORDER — DOXYCYCLINE HYCLATE 100 MG PO CAPS: 100.0000 mg | ORAL_CAPSULE | Freq: Two times a day (BID) | ORAL | 0 refills | Status: DC

## 2024-03-05 NOTE — ED Provider Notes (Signed)
 [place] EMERGENCY DEPARTMENT AT [facility] Provider Note   CSN: [id number] Arrival date & time: 03/05/24  2039     History {Add pertinent medical, surgical, social history, OB history to HPI:1} Chief Complaint  Patient presents with   Insect Bite    Diane Harvey is a 43 y.o. female who presents with concern for a bug bite to her right lower leg.  States this happened 6 days ago when walking in a field.  Since then, the area has been red and has been draining a little bit of pus.  She had a virtual visit and was prescribed Bactrim , she was taking 1 pill a day, 4 pills total, but has not noticed improvement in her symptoms.  Denies any fever or chills.  HPI     Home Medications Prior to Admission medications   Medication Sig Start Date End Date Taking? Authorizing Provider  baclofen  (LIORESAL ) 10 MG tablet TAKE 1 TABLET BY MOUTH DAILY AS NEEDED FOR MUSCLE SPASMS. 12/27/23   Arn Lane, MD  cephALEXin  (KEFLEX ) 500 MG capsule Take 1 capsule (500 mg total) by mouth 4 (four) times daily. 03/04/24   Angelia Kelp, PA-C  fluticasone  (FLONASE ) 50 MCG/ACT nasal spray Place 2 sprays into both nostrils daily. Patient not taking: Reported on 07/13/2022 03/14/22   Autry-Lott, Jeneane Miracle, DO  ibuprofen  (ADVIL ) 400 MG tablet Take 1 tablet (400 mg total) by mouth every 12 (twelve) hours as needed. 03/04/24   Arn Lane, MD  levETIRAcetam  (KEPPRA  XR) 750 MG 24 hr tablet Take 2 tablets (1,500 mg total) by mouth at bedtime. 08/08/23   Johny Nap, NP  methylPREDNISolone  (MEDROL  DOSEPAK) 4 MG TBPK tablet 6 day dose pack - take as directed 04/01/23   McDonald, Adam R, DPM  norethindrone  (MICRONOR ) 0.35 MG tablet TAKE 1 TABLET BY MOUTH EVERY DAY 12/23/23   Arn Lane, MD      Allergies    Patient has no known allergies.    Review of Systems   Review of Systems  Physical Exam Updated Vital Signs BP (!) 138/90   Pulse 90   Temp 98 F (36.7 C) (Oral)   Resp 18   Ht  5\' 7"  (1.702 m)   Wt 106 kg   SpO2 99%   BMI 36.60 kg/m  Physical Exam  ED Results / Procedures / Treatments   Labs (all labs ordered are listed, but only abnormal results are displayed) Labs Reviewed  COMPREHENSIVE METABOLIC PANEL WITH GFR - Abnormal; Notable for the following components:      Result Value   Potassium 3.2 (*)    Glucose, Bld 124 (*)    All other components within normal limits  CBC WITH DIFFERENTIAL/PLATELET - Abnormal; Notable for the following components:   WBC 12.2 (*)    MCV 78.3 (*)    MCH 24.9 (*)    RDW 15.9 (*)    Neutro Abs 9.6 (*)    All other components within normal limits    EKG None  Radiology No results found.  Procedures Procedures  {Document cardiac monitor, telemetry assessment procedure when appropriate:1}  Medications Ordered in ED Medications - No data to display  ED Course/ Medical Decision Making/ A&P   {   Click here for ABCD2, HEART and other calculatorsREFRESH Note before signing :1}  Medical Decision Making Amount and/or Complexity of Data Reviewed Labs: ordered.   ***  {Document critical care time when appropriate:1} {Document review of labs and clinical decision tools ie heart score, Chads2Vasc2 etc:1}  {Document your independent review of radiology images, and any outside records:1} {Document your discussion with family members, caretakers, and with consultants:1} {Document social determinants of health affecting pt's care:1} {Document your decision making why or why not admission, treatments were needed:1} Final Clinical Impression(s) / ED Diagnoses Final diagnoses:  None    Rx / DC Orders ED Discharge Orders     None

## 2024-03-05 NOTE — ED Triage Notes (Addendum)
 Patient here w/ insect bite (patient unsure of what bit her) to the R anterior tib/fib area since Saturday after walking in an open field. Had an e-visit and prescribed abx, started taking them on Monday, but notes increased redness and swelling and pain around the bite. Denies fevers, chills at home. Reports some pus coming from the bite.

## 2024-03-05 NOTE — Discharge Instructions (Addendum)
 It appears you have an infection of the skin. You have been prescribed an antibiotic to treat this infection called doxycycline . Take this antibiotic 2 times a day for the next 7 days. Take the full course of your antibiotic even if you start feeling better. Antibiotics may cause you to have diarrhea.  Please stop taking your sulfamethoxazole  trimethoprim  antibiotic.  Please keep the areas clean with soap and water.  You may apply warm compresses to the area to help promote drainage.  You may use up to 600mg  ibuprofen  every 6 hours as needed for pain.  Do not exceed 2.4g of ibuprofen  per day.  Please follow-up with your primary care provider if your symptoms have not resolved by the end of the antibiotic course.  You had an elevation in your white blood cell count today on your lab work which indicates your body is fighting off infection.    You were found to have a slightly low potassium level on your labs today. Please increase your dietary intake of calcium with foods such as avocados, potatoes, bananas, spinach, salmon. Please have your PCP monitor this value.   Return to ER if you have any redness that is getting worse despite antibiotic use, fevers, any other new or concerning symptoms

## 2024-03-09 ENCOUNTER — Telehealth: Admitting: Family Medicine

## 2024-03-09 ENCOUNTER — Inpatient Hospital Stay (HOSPITAL_BASED_OUTPATIENT_CLINIC_OR_DEPARTMENT_OTHER)
Admission: EM | Admit: 2024-03-09 | Discharge: 2024-03-13 | DRG: 603 | Disposition: A | Discharge status: Home / Self Care | Attending: Internal Medicine | Admitting: Internal Medicine

## 2024-03-09 ENCOUNTER — Encounter (HOSPITAL_BASED_OUTPATIENT_CLINIC_OR_DEPARTMENT_OTHER): Payer: Self-pay

## 2024-03-09 ENCOUNTER — Other Ambulatory Visit: Payer: Self-pay

## 2024-03-09 ENCOUNTER — Emergency Department (HOSPITAL_BASED_OUTPATIENT_CLINIC_OR_DEPARTMENT_OTHER): Admitting: Radiology

## 2024-03-09 DIAGNOSIS — E66811 Obesity, class 1: Secondary | ICD-10-CM | POA: Diagnosis present

## 2024-03-09 DIAGNOSIS — M779 Enthesopathy, unspecified: Secondary | ICD-10-CM | POA: Diagnosis present

## 2024-03-09 DIAGNOSIS — L03115 Cellulitis of right lower limb: Principal | ICD-10-CM | POA: Diagnosis present

## 2024-03-09 DIAGNOSIS — D75839 Thrombocytosis, unspecified: Secondary | ICD-10-CM | POA: Diagnosis present

## 2024-03-09 DIAGNOSIS — S82301A Unspecified fracture of lower end of right tibia, initial encounter for closed fracture: Secondary | ICD-10-CM | POA: Diagnosis present

## 2024-03-09 DIAGNOSIS — Z6836 Body mass index (BMI) 36.0-36.9, adult: Secondary | ICD-10-CM

## 2024-03-09 DIAGNOSIS — T63441A Toxic effect of venom of bees, accidental (unintentional), initial encounter: Secondary | ICD-10-CM

## 2024-03-09 DIAGNOSIS — L0390 Cellulitis, unspecified: Secondary | ICD-10-CM

## 2024-03-09 DIAGNOSIS — G40909 Epilepsy, unspecified, not intractable, without status epilepticus: Secondary | ICD-10-CM | POA: Diagnosis present

## 2024-03-09 DIAGNOSIS — E876 Hypokalemia: Secondary | ICD-10-CM | POA: Diagnosis present

## 2024-03-09 LAB — CBC WITH DIFFERENTIAL/PLATELET
Abs Immature Granulocytes: 0.02 10*3/uL (ref 0.00–0.07)
Basophils Absolute: 0.1 10*3/uL (ref 0.0–0.1)
Basophils Relative: 1 %
Eosinophils Absolute: 0.2 10*3/uL (ref 0.0–0.5)
Eosinophils Relative: 2 %
HCT: 36.5 % (ref 36.0–46.0)
Hemoglobin: 11.6 g/dL — ABNORMAL LOW (ref 12.0–15.0)
Immature Granulocytes: 0 %
Lymphocytes Relative: 32 %
Lymphs Abs: 3 10*3/uL (ref 0.7–4.0)
MCH: 25.1 pg — ABNORMAL LOW (ref 26.0–34.0)
MCHC: 31.8 g/dL (ref 30.0–36.0)
MCV: 78.8 fL — ABNORMAL LOW (ref 80.0–100.0)
Monocytes Absolute: 1 10*3/uL (ref 0.1–1.0)
Monocytes Relative: 11 %
Neutro Abs: 5.1 10*3/uL (ref 1.7–7.7)
Neutrophils Relative %: 54 %
Platelets: 452 10*3/uL — ABNORMAL HIGH (ref 150–400)
RBC: 4.63 MIL/uL (ref 3.87–5.11)
RDW: 15.6 % — ABNORMAL HIGH (ref 11.5–15.5)
WBC: 9.3 10*3/uL (ref 4.0–10.5)
nRBC: 0 % (ref 0.0–0.2)

## 2024-03-09 LAB — BASIC METABOLIC PANEL WITH GFR
Anion gap: 11 (ref 5–15)
BUN: 9 mg/dL (ref 6–20)
CO2: 27 mmol/L (ref 22–32)
Calcium: 9.5 mg/dL (ref 8.9–10.3)
Chloride: 101 mmol/L (ref 98–111)
Creatinine, Ser: 0.76 mg/dL (ref 0.44–1.00)
GFR, Estimated: >60 mL/min (ref >60)
Glucose, Bld: 88 mg/dL (ref 70–99)
Potassium: 3.1 mmol/L — ABNORMAL LOW (ref 3.5–5.1)
Sodium: 139 mmol/L (ref 135–145)

## 2024-03-09 LAB — HCG, SERUM, QUALITATIVE: Preg, Serum: NEGATIVE

## 2024-03-09 MED ORDER — VANCOMYCIN HCL IN DEXTROSE 1-5 GM/200ML-% IV SOLN: 1000.0000 mg | Freq: Once | INTRAVENOUS | Status: DC

## 2024-03-09 MED ORDER — LEVETIRACETAM 500 MG PO TABS
1500.0000 mg | ORAL_TABLET | Freq: Every day | ORAL | Status: DC
Start: 2024-03-09 — End: 2024-03-11
  Administered 2024-03-09 – 2024-03-10 (×2): 1500 mg via ORAL
  Filled 2024-03-09 (×2): qty 3

## 2024-03-09 MED ORDER — VANCOMYCIN HCL IN DEXTROSE 1-5 GM/200ML-% IV SOLN
1000.0000 mg | Freq: Once | INTRAVENOUS | Status: AC
  Administered 2024-03-09: 1000 mg via INTRAVENOUS
  Filled 2024-03-09: qty 200

## 2024-03-09 NOTE — Progress Notes (Signed)
 You have not improved after medication changes and now are having swelling, you need to go back to the ED. You might have an infection that will need to be drained with some help.   Based on what you shared with me, I feel your condition warrants further evaluation as soon as possible at an Emergency department.    NOTE: There will be NO CHARGE for this eVisit   If you are having a true medical emergency please call 911.      Emergency Department-[facility]  Get Driving Directions  [phone]  [address]  [place], [place] [address]  Open 24/7/365      [facility] Emergency Department at [facility]  Get Driving Directions  [address]  [place], [place] [address]  Open 24/7/365    Emergency Department- [facility]  Get Driving Directions  [phone]  [address]  [place], [place] [address]  Open 24/7/365      [facility] at [facility]  Get Driving Directions  [phone]  [address]  [place], [place] [address]  Open 24/7/365    [facility]  Emergency Department- [facility]  Get Driving Directions  [phone]  [address]  [place], [place] [address]  Open 24/7/365    [place]  Emergency Department- [facility] [place]  Get Driving Directions  [address]  [place], [place] [address]  Open 24/7/365    [facility]  Emergency Department- [facility]  Get Driving Directions  [phone]  [address]  [place], [place] [address]  Open 24/7/365

## 2024-03-09 NOTE — ED Provider Notes (Signed)
 [place] EMERGENCY DEPARTMENT AT [facility] Provider Note   CSN: [id number] Arrival date & time: 03/09/24  1755     History  Chief Complaint  Patient presents with   Ankle Pain    R    Diane Harvey is a 43 y.o. female here for evaluation of swelling and redness to right lower extremity.  Thought initially she was bit by a spider to her right calf about 12 days ago.  Did an e-visit online was started on Bactrim .  Symptoms did not improve 3 days later was subsequently seen again via e-visit and started on Keflex .  We seen in the emergency department 4/24.  Changed to Keflex .  She comes in today due to worsening swelling and redness to her right lower extremity despite doxycycline .  No fever.  No numbness or weakness.  She did not see an insect bite her.  She has had a draining wound to the right medial anterior calf.  No history of MRSA.  No chest pain or shortness of breath.  She has not had anything like this previously.  HPI     Home Medications Prior to Admission medications   Medication Sig Start Date End Date Taking? Authorizing Provider  baclofen  (LIORESAL ) 10 MG tablet TAKE 1 TABLET BY MOUTH DAILY AS NEEDED FOR MUSCLE SPASMS. 12/27/23   Arn Lane, MD  cephALEXin  (KEFLEX ) 500 MG capsule Take 1 capsule (500 mg total) by mouth 4 (four) times daily. 03/04/24   Angelia Kelp, PA-C  doxycycline  (VIBRAMYCIN ) 100 MG capsule Take 1 capsule (100 mg total) by mouth 2 (two) times daily for 7 days. 03/05/24 03/12/24  Franaszek, Amanda, PA-C  fluticasone  (FLONASE ) 50 MCG/ACT nasal spray Place 2 sprays into both nostrils daily. Patient not taking: Reported on 07/13/2022 03/14/22   Autry-Lott, Jeneane Miracle, DO  ibuprofen  (ADVIL ) 400 MG tablet Take 1 tablet (400 mg total) by mouth every 12 (twelve) hours as needed. 03/04/24   Arn Lane, MD  levETIRAcetam  (KEPPRA  XR) 750 MG 24 hr tablet Take 2 tablets (1,500 mg total) by mouth at bedtime. 08/08/23   Johny Nap, NP   methylPREDNISolone  (MEDROL  DOSEPAK) 4 MG TBPK tablet 6 day dose pack - take as directed 04/01/23   McDonald, Adam R, DPM  norethindrone  (MICRONOR ) 0.35 MG tablet TAKE 1 TABLET BY MOUTH EVERY DAY 12/23/23   Arn Lane, MD      Allergies    Patient has no known allergies.    Review of Systems   Review of Systems  Constitutional: Negative.   HENT: Negative.    Respiratory: Negative.    Cardiovascular: Negative.   Gastrointestinal: Negative.   Genitourinary: Negative.   Musculoskeletal: Negative.   Skin:  Positive for wound.  Neurological: Negative.   All other systems reviewed and are negative.   Physical Exam Updated Vital Signs BP (!) 156/87 (BP Location: Right Arm)   Pulse 79   Temp 98.9 F (37.2 C)   Resp 16   SpO2 100%  Physical Exam Vitals and nursing note reviewed.  Constitutional:      General: She is not in acute distress.    Appearance: She is well-developed. She is not ill-appearing, toxic-appearing or diaphoretic.  HENT:     Head: Atraumatic.  Eyes:     Pupils: Pupils are equal, round, and reactive to light.  Cardiovascular:     Rate and Rhythm: Normal rate.     Pulses: Normal pulses.  Radial pulses are 2+ on the right side and 2+ on the left side.       Dorsalis pedis pulses are 2+ on the right side and 2+ on the left side.     Heart sounds: Normal heart sounds.  Pulmonary:     Effort: Pulmonary effort is normal. No respiratory distress.     Breath sounds: Normal breath sounds and air entry.  Abdominal:     General: There is no distension.  Musculoskeletal:        General: Normal range of motion.     Cervical back: Normal range of motion.     Comments: No bony tenderness, compartments soft, right calf area of erythema, induration, 3 cm area of already draining purulence.  Diffuse edema from mid knee distally  Skin:    General: Skin is warm and dry.     Comments: See picture in chart  Neurological:     General: No focal deficit present.      Mental Status: She is alert.  Psychiatric:        Mood and Affect: Mood normal.     ED Results / Procedures / Treatments   Labs (all labs ordered are listed, but only abnormal results are displayed) Labs Reviewed  CBC WITH DIFFERENTIAL/PLATELET - Abnormal; Notable for the following components:      Result Value   Hemoglobin 11.6 (*)    MCV 78.8 (*)    MCH 25.1 (*)    RDW 15.6 (*)    Platelets 452 (*)    All other components within normal limits  BASIC METABOLIC PANEL WITH GFR - Abnormal; Notable for the following components:   Potassium 3.1 (*)    All other components within normal limits  HCG, SERUM, QUALITATIVE    EKG None  Radiology DG Tibia/Fibula Right Result Date: 03/09/2024 EXAM: XRAY OF THE RIGHT TIBIA AND FIBULA 03/09/2024 10:06:44 PM COMPARISON: None available. CLINICAL HISTORY: Wound. Patient complains of right ankle swelling, tightness, and pain onset after 8-12 hours at work. Seen Thursday for spider bite - states it's getting better, but now the ankle is swelling up. FINDINGS: BONES AND JOINTS: Proximal tibia and fibula are within normal limits. Distal tibia/fibula described on dedicated ankle radiographs. SOFT TISSUES: The soft tissues are unremarkable. IMPRESSION: 1. No acute osseous abnormality involving the proximal tibia and fibula. 2. Distal tibia/fibula described on dedicated ankle radiographs. Electronically signed by: Zadie Herter MD 03/09/2024 10:13 PM EDT RP Workstation: [id number]   DG Ankle Complete Right Result Date: 03/09/2024 CLINICAL DATA:  Right ankle pain and swelling after 812 hours at work. Seen Thursday for a spider bite. EXAM: RIGHT ANKLE - COMPLETE 3+ VIEW COMPARISON:  None Available. FINDINGS: Diffuse soft tissue swelling about the right ankle most prominent medially. There is an anterior avulsion fracture off of the distal tibia. No other focal bone lesion or bone destruction. Joint spaces are normal. No radiopaque soft tissue foreign  bodies or soft tissue gas identified. Plantar and prominent Achilles calcaneal spurs present. IMPRESSION: 1. Diffuse soft tissue swelling about the right ankle. No radiopaque foreign body or soft tissue gas. 2. Avulsion fracture demonstrated off of the anterior distal tibia. Electronically Signed   By: Boyce Byes M.D.   On: 03/09/2024 21:57    Procedures .Ultrasound ED Soft Tissue  Date/Time: 03/09/2024 11:06 PM  Performed by: Dickson Founds, PA-C Authorized by: Coleta David A, PA-C   Procedure details:    Indications: limb pain, localization of abscess and evaluate  for cellulitis     Transverse view:  Visualized   Longitudinal view:  Visualized   Images: archived   Location:    Location: lower extremity     Side:  Right Findings:     abscess present    cellulitis present    no foreign body present     Medications Ordered in ED Medications  vancomycin (VANCOCIN) IVPB 1000 mg/200 mL premix (0 mg Intravenous Stopped 03/09/24 2343)    Followed by  vancomycin (VANCOCIN) IVPB 1000 mg/200 mL premix (1,000 mg Intravenous New Bag/Given 03/09/24 2343)  levETIRAcetam  (KEPPRA ) tablet 1,500 mg (1,500 mg Oral Given 03/09/24 2244)    ED Course/ Medical Decision Making/ A&P   43 year old here for evaluation of right lower extremity pain and swelling.  She has been seen multiple times for cellulitis likely thought due to a spider bite which patient did not see.  Symptoms started about 12 to 14 days ago.  She has been on Bactrim , Keflex  and doxycycline  without obvious improvement.  Today developed worsening swelling.  She is neurovascularly intact.  No bony tenderness.  No crepitus to suggest necrotizing infection.  Bedside ultrasound does show cobblestoning with possible 1 cm abscess however this area is already draining on exam.  She has no chest pain or shortness of breath.  She has no systemic symptoms however given her multiple trial of antibiotics I feel she would likely benefit  from inpatient admission.  Will plan on imaging, IV antibiotics.  Labs and imaging personally viewed and interpreted:  No gas on imaging to suggest necrotizing infection, does note possible avulsion fracture malleolus however patient denies any recent falls or injuries CBC without leukocytosis Metabolic panel potassium 3.1 Pregnancy test negative  Will admit for failed outpatient antibiotics for cellulitis.  If does not improve on IV abx may require additional imaging for deeper space infection.  Patient agreeable for admission  Discussed with Dr. Achilles Holes who is agreeable to accept patient in transfer for admission  The patient appears reasonably stabilized for admission considering the current resources, flow, and capabilities available in the ED at this time, and I doubt any other [facility] requiring further screening and/or treatment in the ED prior to admission.                                 Medical Decision Making Amount and/or Complexity of Data Reviewed Independent Historian: friend External Data Reviewed: labs, radiology and notes. Labs: ordered. Decision-making details documented in ED Course. Radiology: ordered and independent interpretation performed. Decision-making details documented in ED Course.  Risk OTC drugs. Prescription drug management. Parenteral controlled substances. Decision regarding hospitalization. Diagnosis or treatment significantly limited by social determinants of health.         Final Clinical Impression(s) / ED Diagnoses Final diagnoses:  Cellulitis of right lower extremity    Rx / DC Orders ED Discharge Orders     None         Dempsey Knotek A, PA-C 03/09/24 2352    Onetha Bile, MD 03/10/24 1620

## 2024-03-09 NOTE — ED Triage Notes (Signed)
 Pt c/o R ankle swelling, tightness/ pain onset "after 8-12hrs at work."  Seen Thursday for spider bite- states it's getting better, "but now my ankle is swelling up."

## 2024-03-10 DIAGNOSIS — L03115 Cellulitis of right lower limb: Secondary | ICD-10-CM

## 2024-03-10 DIAGNOSIS — E66811 Obesity, class 1: Secondary | ICD-10-CM | POA: Diagnosis present

## 2024-03-10 DIAGNOSIS — E876 Hypokalemia: Secondary | ICD-10-CM | POA: Diagnosis present

## 2024-03-10 DIAGNOSIS — M25571 Pain in right ankle and joints of right foot: Secondary | ICD-10-CM | POA: Diagnosis present

## 2024-03-10 DIAGNOSIS — G40909 Epilepsy, unspecified, not intractable, without status epilepticus: Secondary | ICD-10-CM | POA: Diagnosis present

## 2024-03-10 DIAGNOSIS — S82301A Unspecified fracture of lower end of right tibia, initial encounter for closed fracture: Secondary | ICD-10-CM | POA: Diagnosis present

## 2024-03-10 DIAGNOSIS — Z6836 Body mass index (BMI) 36.0-36.9, adult: Secondary | ICD-10-CM | POA: Diagnosis not present

## 2024-03-10 DIAGNOSIS — M779 Enthesopathy, unspecified: Secondary | ICD-10-CM | POA: Diagnosis present

## 2024-03-10 DIAGNOSIS — D75839 Thrombocytosis, unspecified: Secondary | ICD-10-CM | POA: Diagnosis present

## 2024-03-10 LAB — MAGNESIUM: Magnesium: 2.2 mg/dL (ref 1.7–2.4)

## 2024-03-10 LAB — HIV ANTIBODY (ROUTINE TESTING W REFLEX): HIV Screen 4th Generation wRfx: NONREACTIVE

## 2024-03-10 MED ORDER — ONDANSETRON HCL 4 MG/2ML IJ SOLN: 4.0000 mg | Freq: Four times a day (QID) | INTRAMUSCULAR | Status: DC | PRN

## 2024-03-10 MED ORDER — ACETAMINOPHEN 325 MG PO TABS
650.0000 mg | ORAL_TABLET | Freq: Four times a day (QID) | ORAL | Status: DC | PRN
  Administered 2024-03-10: 650 mg via ORAL
  Filled 2024-03-10: qty 2

## 2024-03-10 MED ORDER — VANCOMYCIN HCL IN DEXTROSE 1-5 GM/200ML-% IV SOLN
1000.0000 mg | Freq: Two times a day (BID) | INTRAVENOUS | Status: DC
Start: 2024-03-10 — End: 2024-03-13
  Administered 2024-03-10 – 2024-03-13 (×7): 1000 mg via INTRAVENOUS
  Filled 2024-03-10 (×7): qty 200

## 2024-03-10 MED ORDER — POTASSIUM CHLORIDE CRYS ER 20 MEQ PO TBCR
60.0000 meq | EXTENDED_RELEASE_TABLET | Freq: Once | ORAL | Status: AC
  Administered 2024-03-10: 60 meq via ORAL
  Filled 2024-03-10: qty 3

## 2024-03-10 MED ORDER — OXYCODONE HCL 5 MG PO TABS
5.0000 mg | ORAL_TABLET | ORAL | Status: DC | PRN
  Filled 2024-03-10 (×2): qty 1

## 2024-03-10 MED ORDER — ALBUTEROL SULFATE (2.5 MG/3ML) 0.083% IN NEBU: 2.5000 mg | INHALATION_SOLUTION | RESPIRATORY_TRACT | Status: DC | PRN

## 2024-03-10 MED ORDER — ACETAMINOPHEN 650 MG RE SUPP: 650.0000 mg | Freq: Four times a day (QID) | RECTAL | Status: DC | PRN

## 2024-03-10 MED ORDER — ONDANSETRON HCL 4 MG PO TABS: 4.0000 mg | ORAL_TABLET | Freq: Four times a day (QID) | ORAL | Status: DC | PRN

## 2024-03-10 MED ORDER — ENOXAPARIN SODIUM 60 MG/0.6ML IJ SOSY
50.0000 mg | PREFILLED_SYRINGE | INTRAMUSCULAR | Status: DC
  Administered 2024-03-10 – 2024-03-12 (×3): 50 mg via SUBCUTANEOUS
  Filled 2024-03-10 (×3): qty 0.6

## 2024-03-10 NOTE — H&P (Signed)
 History and Physical  Diane Harvey UEA:[id number] DOB: [birth year] DOA: 03/09/2024  PCP: Arn Lane, MD   Chief Complaint: Right leg cellulitis  HPI: Diane Harvey is a 43 y.o. female with medical history significant for seizure disorder being admitted to the hospital with a right lower extremity cellulitis that failed outpatient management.  She thinks maybe she was bit by a spider on her right calf about 12 days ago, though she did not see it specifically.  She was initially started on Bactrim , 3 days later changed to Keflex  and then later doxycycline .  She denies any fever, no numbness, weakness, no significant pain, has a quarter sized wound right medial anterior calf that was draining some clear fluid.  Came to the ER since was not improving, started on IV vancomycin and admitted to the hospitalist service.  This morning, patient states that her leg is already feeling better, the erythema and the swelling are already notably improved.  Review of Systems: Please see HPI for pertinent positives and negatives. A complete 10 system review of systems are otherwise negative.  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  Unknown name.  Has tried injections with good results.  Plans on returning.  No current problems as of 03/17/18   New onset seizure (HCC) 01/10/2021   Seizures (HCC) 01/04/2021   Supervision of other normal pregnancy 04/07/2015    Clinic/provider [facility]) Prenatal Labs  Dating LMP and 21w u/s Blood type: O/POS/-- (02/16 1350)   Genetic Screen declined Antibody:NEG (02/16 1350)  Anatomic US  HC<3rd %ile at 25w Rubella: 2.16 (02/16 1350)  GTT Early: 125                Third trimester: 3 hour normal RPR: NON REAC (05/26 0909)   Flu vaccine Off season HBsAg: NEGATIVE (02/16 1350)   TDaP vaccine 04/07/15                                              Rhogam: not indicated HIV: NONREACTIVE (05/26 0909)   GBS      Negative                                (For PCN allergy, check sensitivities) GBS:NOT DETECTED (07/01 1440)  Contraception BTL - consent signed 04/07/15; Changed mind and wants Depo Pap: 09/26/12 - negative w/ +HR HPV, needs repeat pap postpartum  Baby Food Bottle   Circumcision N/a   Pediatrician [facility]   Support Person FOB        SVD (spontaneous vaginal delivery)    x 3   Past Surgical History:  Procedure Laterality Date   CARPAL TUNNEL RELEASE Bilateral    2 separate surgeries   CESAREAN SECTION N/A 06/09/2015   Procedure: CESAREAN SECTION;  Surgeon: Julianne Octave, MD;  Location: [facility];  Service: Obstetrics;  Laterality: N/A;   LAPAROSCOPIC TUBAL LIGATION  Bilateral 03/25/2018   Procedure: LAPAROSCOPIC TUBAL LIGATION;  Surgeon: Ana Balling, MD;  Location: [facility];  Service: Gynecology;  Laterality: Bilateral;   WISDOM TOOTH EXTRACTION     Social History:  reports that she has never smoked. She has never used smokeless tobacco. She reports that she does not drink alcohol and does not use drugs.  No Known Allergies  Family History  Problem Relation Age of Onset   Hypertension Mother    Hypertension Father    Diabetes Father    Hyperlipidemia Father    Cancer Paternal Grandfather        throat?   Heart disease Neg Hx    Stroke Neg Hx      Prior to Admission medications   Medication Sig Start Date End Date Taking? Authorizing Provider  baclofen  (LIORESAL ) 10 MG tablet TAKE 1 TABLET BY MOUTH DAILY AS NEEDED FOR MUSCLE SPASMS. 12/27/23   Arn Lane, MD  cephALEXin  (KEFLEX ) 500 MG capsule Take 1 capsule (500 mg total) by mouth 4 (four) times daily. 03/04/24   Angelia Kelp, PA-C  doxycycline  (VIBRAMYCIN ) 100 MG capsule Take 1  capsule (100 mg total) by mouth 2 (two) times daily for 7 days. 03/05/24 03/12/24  Franaszek, Amanda, PA-C  fluticasone  (FLONASE ) 50 MCG/ACT nasal spray Place 2 sprays into both nostrils daily. Patient not taking: Reported on 07/13/2022 03/14/22   Autry-Lott, Jeneane Miracle, DO  ibuprofen  (ADVIL ) 400 MG tablet Take 1 tablet (400 mg total) by mouth every 12 (twelve) hours as needed. 03/04/24   Arn Lane, MD  levETIRAcetam  (KEPPRA  XR) 750 MG 24 hr tablet Take 2 tablets (1,500 mg total) by mouth at bedtime. 08/08/23   Johny Nap, NP  methylPREDNISolone  (MEDROL  DOSEPAK) 4 MG TBPK tablet 6 day dose pack - take as directed 04/01/23   McDonald, Olive Better, DPM  norethindrone  (MICRONOR ) 0.35 MG tablet TAKE 1 TABLET BY MOUTH EVERY DAY 12/23/23   Arn Lane, MD    Physical Exam: BP 131/88 (BP Location: Left Arm)   Pulse 77   Temp 98 F (36.7 C) (Oral)   Resp 16   Ht 5\' 7"  (1.702 m)   Wt 106 kg   SpO2 95%   BMI 36.60 kg/m  General:  Alert, oriented, calm, in no acute distress, sitting up on the side of the bed, looks nontoxic and very comfortable Cardiovascular: RRR, no murmurs or rubs, no peripheral edema  Respiratory: clear to auscultation bilaterally, no wheezes, no crackles  Abdomen: soft, nontender, nondistended, normal bowel tones heard  Skin: dry, no rashes, she has right lower extremity cellulitis with erythema, nontender, no drainage currently, there is a quarter sized central eschar, there is some associated mild nonpitting edema in the right lower extremity.  When compared to photographs taken in the ER, there is already significant improvement in the erythema and degree of edema Musculoskeletal: no joint effusions, normal range of motion  Psychiatric: appropriate affect, normal speech  Neurologic: extraocular muscles intact, clear speech, moving all extremities with intact sensorium         Labs on Admission:  Basic Metabolic Panel: Recent Labs  Lab 03/05/24 2107 03/09/24 2156  NA  139 139  K 3.2* 3.1*  CL 103 101  CO2 24 27  GLUCOSE 124* 88  BUN 7 9  CREATININE 0.86 0.76  CALCIUM 9.3 9.5   Liver Function Tests: Recent Labs  Lab 03/05/24 2107  AST 17  ALT 6  ALKPHOS 58  BILITOT 0.7  PROT 7.9  ALBUMIN 4.3   No results for input(s): "LIPASE", "AMYLASE" in the last 168 hours. No results for input(s): "AMMONIA" in the last 168 hours. CBC: Recent Labs  Lab 03/05/24 2107 03/09/24 2156  WBC 12.2* 9.3  NEUTROABS 9.6* 5.1  HGB 12.4 11.6*  HCT 39.0 36.5  MCV 78.3* 78.8*  PLT 396 452*   Cardiac Enzymes: No results for input(s): "CKTOTAL", "CKMB", "CKMBINDEX", "TROPONINI" in the last 168 hours. BNP (last 3 results) No results for input(s): "BNP" in the last 8760 hours.  ProBNP (last 3 results) No results for input(s): "PROBNP" in the last 8760 hours.  CBG: No results for input(s): "GLUCAP" in the last 168 hours.  Radiological Exams on Admission: DG Tibia/Fibula Right Result Date: 03/09/2024 EXAM: XRAY OF THE RIGHT TIBIA AND FIBULA 03/09/2024 10:06:44 PM COMPARISON: None available. CLINICAL HISTORY: Wound. Patient complains of right ankle swelling, tightness, and pain onset after 8-12 hours at work. Seen Thursday for spider bite - states it's getting better, but now the ankle is swelling up. FINDINGS: BONES AND JOINTS: Proximal tibia and fibula are within normal limits. Distal tibia/fibula described on dedicated ankle radiographs. SOFT TISSUES: The soft tissues are unremarkable. IMPRESSION: 1. No acute osseous abnormality involving the proximal tibia and fibula. 2. Distal tibia/fibula described on dedicated ankle radiographs. Electronically signed by: Zadie Herter MD 03/09/2024 10:13 PM EDT RP Workstation: [id number]   DG Ankle Complete Right Result Date: 03/09/2024 CLINICAL DATA:  Right ankle pain and swelling after 812 hours at work. Seen Thursday for a spider bite. EXAM: RIGHT ANKLE - COMPLETE 3+ VIEW COMPARISON:  None Available. FINDINGS: Diffuse  soft tissue swelling about the right ankle most prominent medially. There is an anterior avulsion fracture off of the distal tibia. No other focal bone lesion or bone destruction. Joint spaces are normal. No radiopaque soft tissue foreign bodies or soft tissue gas identified. Plantar and prominent Achilles calcaneal spurs present. IMPRESSION: 1. Diffuse soft tissue swelling about the right ankle. No radiopaque foreign body or soft tissue gas. 2. Avulsion fracture demonstrated off of the anterior distal tibia. Electronically Signed   By: Boyce Byes M.D.   On: 03/09/2024 21:57   Assessment/Plan Diane Harvey is a 43 y.o. female with medical history significant for seizure disorder being admitted to the hospital with a right lower extremity cellulitis that failed outpatient management.   Cellulitis-without evidence of deeper infection, or sepsis.  Continue empiric IV vancomycin.  Lower extremity swelling is already improving, patient has no pain.  If develops pain in the calf, or swelling fails to improve further, could consider right lower extremity Doppler to rule out DVT.  Seizure disorder-continue Keppra  nightly  Hypokalemia-replete orally, check magnesium   Avulsion fracture-of the distal tibia, patient does have a history of significant tendinitis so this may be related.  No other injury.  PT consulted.  DVT prophylaxis: Lovenox     Code Status: Full Code  Consults called: None  Admission status: The appropriate patient status for this patient is INPATIENT. Inpatient status is judged to be reasonable and necessary in order to provide the required intensity of service to ensure the patient's safety. The patient's presenting symptoms, physical exam findings, and initial radiographic and laboratory data in the context of their chronic comorbidities is felt to place them at high risk for further clinical deterioration. Furthermore, it is not anticipated that the patient will be medically stable  for discharge from the hospital within 2 midnights of admission.    I  certify that at the point of admission it is my clinical judgment that the patient will require inpatient hospital care spanning beyond 2 midnights from the point of admission due to high intensity of service, high risk for further deterioration and high frequency of surveillance required  Time spent: 59 minutes  Darrow Barreiro Rickey Charm MD [facility] Pager [phone]  If 7PM-7AM, please contact night-coverage [email or web address] Password [facility]  03/10/2024, 7:28 AM

## 2024-03-10 NOTE — Progress Notes (Signed)
 Plan of Care Note for accepted transfer   Patient: Diane Harvey MRN: [id number]   DOA: 03/09/2024  Facility requesting transfer: [facility] Requesting Provider: Dickson Founds, PA-C Reason for transfer: Right lower extremity cellulitis failing outpatient treatment. Facility course: Diane Harvey is a 43 y.o. female here for evaluation of swelling and redness to right lower extremity.  Thought initially she was bit by a spider to her right calf about 12 days ago.  Did an e-visit online was started on Bactrim .  Symptoms did not improve 3 days later was subsequently seen again via e-visit and started on Keflex .  We seen in the emergency department 4/24.  Changed to Keflex .  She comes in today due to worsening swelling and redness to her right lower extremity despite doxycycline .  No fever.  No numbness or weakness.  She did not see an insect bite her.  She has had a draining wound to the right medial anterior calf.  No history of MRSA.  No chest pain or shortness of breath.  She has not had anything like this previously.  Upon arrival to the ED, BP was 156/87 with otherwise normal vital signs.  Labs revealed hypokalemia of 3.1 with otherwise normal BMP.  Hemoglobin 11.6 and platelets of 452.  Serum pregnancy test was negative.  Right ankle x-ray revealed diffuse soft tissue swelling about the right ankle with no foreign body or soft tissue gas.  Acute avulsion fracture demonstrated off of the anterior distal tibia.. Right tib-fib x-ray showed the following: 1. No acute osseous abnormality involving the proximal tibia and fibula. 2. Distal tibia/fibula described on dedicated ankle radiographs.  The patient was given IV vancomycin.  She will be admitted to a medical-surgical bed for further evaluation and management   Plan of care: The patient is accepted for admission to Med-surg  unit, at [facility]..  She will be under the care and responsibility of the EDP until arrival to [facility].  Author: Virgene Griffin, MD 03/10/2024  Check [email or web address] for on-call coverage.  Nursing staff, Please call [facility] Admits & Consults System-Wide number on Amion as soon as patient's arrival, so appropriate admitting provider can evaluate the pt.

## 2024-03-10 NOTE — Plan of Care (Signed)

## 2024-03-10 NOTE — Progress Notes (Signed)
   03/10/24 1400  TOC Brief Assessment  Insurance and Status Reviewed  Patient has primary care physician Yes  Home environment has been reviewed home with s/o  Prior level of function: independent  Prior/Current Home Services No current home services  Social Drivers of Health Review SDOH reviewed no interventions necessary  Readmission risk has been reviewed Yes  Transition of care needs no transition of care needs at this time

## 2024-03-10 NOTE — Progress Notes (Signed)
 Pharmacy Antibiotic Note  Diane Harvey is a 43 y.o. female admitted on 03/09/2024 with lower extremity swelling and pain. Patient reports suspected insect bite seen x 3 and prescribed Bactrim  > Keflex  > doxycycline . She presented to the ED and is admitted for IV antibiotics for failure to improve. Area with some drainage. Pharmacy has been consulted for vancomycin dosing.  Plan: -S/p vancomycin 2000 mg - continue with 1000 mg IV q12h -Continue to follow renal function and clinical progress for dose adjustments and de-escalation as indicated  Height: 5\' 7"  (170.2 cm) Weight: 106 kg (233 lb 11 oz) IBW/kg (Calculated) : 61.6  Temp (24hrs), Avg:98.3 F (36.8 C), Min:98 F (36.7 C), Max:98.9 F (37.2 C)  Recent Labs  Lab 03/05/24 2107 03/09/24 2156  WBC 12.2* 9.3  CREATININE 0.86 0.76    Estimated Creatinine Clearance: 111.3 mL/min (by C-G formula based on SCr of 0.76 mg/dL).    No Known Allergies  Antimicrobials this admission: Vancomycin 4/28 >>  Dose adjustments this admission: NA  Microbiology results: NA  Thank you for allowing pharmacy to be a part of this patient's care.  Lolita Rise, PharmD, BCPS Clinical Pharmacist 03/10/2024 7:45 AM

## 2024-03-10 NOTE — Evaluation (Signed)
 Physical Therapy Evaluation Only Patient Details Name: Diane Harvey MRN: [id number] DOB: [birth year] Today's Date: 03/10/2024  History of Present Illness  Diane Harvey is a 43 y.o. female here for evaluation of swelling and redness to right lower extremity; admitted with R lower extremity cellulitis. R ankle xray:  Avulsion fracture demonstrated off of the anterior distal tibia. 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 Pt mobilizing around room and hallway independently. Pt reports initial pain and stiffness in R LE in standing but improves with time. No unsteadiness or near falls. Pt reports past success with walking boot for tendonitis and inquiring about using one when leaving hospital, also inquiring about work limitations- alerted MD of pt's questions. Pt declines acute PT, reports has been mobilizing around room independently since admitted. No acute PT needs identified, will sign off at this time.      If plan is discharge home, recommend the following:     Can travel by private vehicle        Equipment Recommendations None recommended by PT  Recommendations for Other Services       Functional Status Assessment Patient has not had a recent decline in their functional status     Precautions / Restrictions Restrictions Weight Bearing Restrictions Per Provider Order: No      Mobility  Bed Mobility Overal bed mobility: Independent                  Transfers Overall transfer level: Independent Equipment used: None                    Ambulation/Gait Ambulation/Gait assistance: Independent   Assistive device: None Gait Pattern/deviations: WFL(Within Functional Limits) Gait velocity: WFL     General Gait Details: pt ind with amb in room and in hallway, antalgic initially with limp and decreased R stance time/weightbearing but improves with distance  Stairs            Wheelchair  Mobility     Tilt Bed    Modified Rankin (Stroke Patients Only)       Balance Overall balance assessment: No apparent balance deficits (not formally assessed)                                           Pertinent Vitals/Pain Pain Assessment Pain Assessment: Faces Faces Pain Scale: Hurts little more Pain Location: R foot with initial weightbearing Pain Descriptors / Indicators: Discomfort Pain Intervention(s): Limited activity within patient's tolerance, Monitored during session, Repositioned    Home Living Family/patient expects to be discharged to:: Private residence Living Arrangements: Spouse/significant other;Children Available Help at Discharge: Family Type of Home: House Home Access: Level entry     Alternate Level Stairs-Number of Steps: flight Home Layout: Two level;Bed/bath upstairs Home Equipment: None      Prior Function Prior Level of Function : Independent/Modified Independent;Working/employed;Driving             Mobility Comments: pt reports ind, 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 ADLs Comments: pt reports ind     Extremity/Trunk Assessment   Upper Extremity Assessment Upper Extremity Assessment: Overall WFL for tasks assessed    Lower Extremity Assessment Lower Extremity Assessment: RLE deficits/detail RLE Deficits / Details: AROM WFL, strength grossly 5/5, noted swelling and erythema throughout anterior lower leg with ~  quarter sized wound RLE Sensation: WNL RLE Coordination: WNL    Cervical / Trunk Assessment Cervical / Trunk Assessment: Normal  Communication   Communication Communication: No apparent difficulties    Cognition Arousal: Alert Behavior During Therapy: WFL for tasks assessed/performed   PT - Cognitive impairments: No apparent impairments                         Following commands: Intact       Cueing       General Comments      Exercises     Assessment/Plan    PT Assessment  Patient does not need any further PT services  PT Problem List         PT Treatment Interventions      PT Goals (Current goals can be found in the Care Plan section)  Acute Rehab PT Goals Patient Stated Goal: agreeable to PT eval PT Goal Formulation: All assessment and education complete, DC therapy    Frequency       Co-evaluation               AM-PAC PT "6 Clicks" Mobility  Outcome Measure Help needed turning from your back to your side while in a flat bed without using bedrails?: None Help needed moving from lying on your back to sitting on the side of a flat bed without using bedrails?: None Help needed moving to and from a bed to a chair (including a wheelchair)?: None Help needed standing up from a chair using your arms (e.g., wheelchair or bedside chair)?: None Help needed to walk in hospital room?: None Help needed climbing 3-5 steps with a railing? : None 6 Click Score: 24    End of Session   Activity Tolerance: Patient tolerated treatment well Patient left: in bed;with call bell/phone within reach;with family/visitor present Nurse Communication: Mobility status      Time: 4098-1191 PT Time Calculation (min) (ACUTE ONLY): 11 min   Charges:   PT Evaluation $PT Eval Low Complexity: 1 Low   PT General Charges $$ ACUTE PT VISIT: 1 Visit         Tori Lynasia Meloche PT, DPT 03/10/24, 11:05 AM

## 2024-03-10 NOTE — Plan of Care (Signed)

## 2024-03-10 NOTE — ED Notes (Signed)
 [facility] at bedside

## 2024-03-11 DIAGNOSIS — L03115 Cellulitis of right lower limb: Secondary | ICD-10-CM | POA: Diagnosis not present

## 2024-03-11 LAB — CBC
HCT: 39.7 % (ref 36.0–46.0)
Hemoglobin: 11.9 g/dL — ABNORMAL LOW (ref 12.0–15.0)
MCH: 24.8 pg — ABNORMAL LOW (ref 26.0–34.0)
MCHC: 30 g/dL (ref 30.0–36.0)
MCV: 82.9 fL (ref 80.0–100.0)
Platelets: 338 10*3/uL (ref 150–400)
RBC: 4.79 MIL/uL (ref 3.87–5.11)
RDW: 15.6 % — ABNORMAL HIGH (ref 11.5–15.5)
WBC: 6.9 10*3/uL (ref 4.0–10.5)
nRBC: 0 % (ref 0.0–0.2)

## 2024-03-11 LAB — BASIC METABOLIC PANEL WITH GFR
Anion gap: 8 (ref 5–15)
BUN: 7 mg/dL (ref 6–20)
CO2: 24 mmol/L (ref 22–32)
Calcium: 8.3 mg/dL — ABNORMAL LOW (ref 8.9–10.3)
Chloride: 105 mmol/L (ref 98–111)
Creatinine, Ser: 0.64 mg/dL (ref 0.44–1.00)
GFR, Estimated: >60 mL/min (ref >60)
Glucose, Bld: 83 mg/dL (ref 70–99)
Potassium: 3.6 mmol/L (ref 3.5–5.1)
Sodium: 137 mmol/L (ref 135–145)

## 2024-03-11 MED ORDER — LEVETIRACETAM ER 750 MG PO TB24
1500.0000 mg | ORAL_TABLET | Freq: Every day | ORAL | Status: DC
  Administered 2024-03-11 – 2024-03-12 (×2): 1500 mg via ORAL
  Filled 2024-03-11 (×2): qty 2

## 2024-03-11 NOTE — Progress Notes (Signed)
 PROGRESS NOTE    Diane Harvey  [id number]:[id number] DOB: July 15, 1981 DOA: 03/09/2024 PCP: Arn Lane, MD   Brief Narrative:  43 y.o. female with medical history significant for seizure disorder and recent right lower extremity cellulitis following a possible spider bite 12 days prior to presentation which was being treated as an outpatient with oral Bactrim  followed by Keflex  and later doxycycline  presented with worsening right leg cellulitis.  She was started on IV antibiotics.  Assessment & Plan:   Right lower extremity cellulitis with failed outpatient antibiotic treatment -x-ray of right tibia/fibula did not show any acute osseous abnormality -Was treated with oral antibiotics and outpatient as above.  Continue IV vancomycin.  Cellulitis improving.  Pain improving.  Right anterior distal tibia avulsion fracture - No injury reported.  Patient has history of significant tendinitis, might be related.  PT eval.  Outpatient orthopedics evaluation  Seizure disorder - Stable.  Continue Keppra   Hypokalemia - Improved  Thrombocytosis - Improved  Obesity class I -Outpatient follow-up  DVT prophylaxis: Lovenox Code Status: Full Family Communication: None at bedside Disposition Plan: Status is: Inpatient Remains inpatient appropriate because: of severity of illness   Antimicrobials:  Anti-infectives (From admission, onward)    Start     Dose/Rate Route Frequency Ordered Stop   03/10/24 1100  vancomycin (VANCOCIN) IVPB 1000 mg/200 mL premix        1,000 mg 200 mL/hr over 60 Minutes Intravenous Every 12 hours 03/10/24 0734     03/09/24 2300  vancomycin (VANCOCIN) IVPB 1000 mg/200 mL premix       Placed in "Followed by" Linked Group   1,000 mg 200 mL/hr over 60 Minutes Intravenous  Once 03/09/24 2158 03/10/24 0043   03/09/24 2200  vancomycin (VANCOCIN) IVPB 1000 mg/200 mL premix  Status:  Discontinued        1,000 mg 200 mL/hr over 60 Minutes Intravenous  Once 03/09/24 2156  03/09/24 2158   03/09/24 2200  vancomycin (VANCOCIN) IVPB 1000 mg/200 mL premix       Placed in "Followed by" Linked Group   1,000 mg 200 mL/hr over 60 Minutes Intravenous  Once 03/09/24 2158 03/09/24 2343        Subjective: Patient seen and examined at bedside.  Cellulitis and pain are improving.  No fever or vomiting reported.  Objective: Vitals:   03/10/24 0942 03/10/24 1351 03/10/24 2126 03/11/24 0552  BP: 134/76 126/77 130/80 134/79  Pulse: 85 78 80 64  Resp: 18 18 18 18   Temp: 97.9 F (36.6 C) 98.7 F (37.1 C) 97.7 F (36.5 C) 97.7 F (36.5 C)  TempSrc:   Oral Oral  SpO2: 98% 99% 98% 96%  Weight:      Height:        Intake/Output Summary (Last 24 hours) at 03/11/2024 1036 Last data filed at 03/10/2024 1504 Gross per 24 hour  Intake 200 ml  Output --  Net 200 ml   Filed Weights   03/10/24 0202  Weight: 106 kg    Examination:  General exam: Appears calm and comfortable.  On room air. Respiratory system: Bilateral decreased breath sounds at bases Cardiovascular system: S1 & S2 heard, Rate controlled Gastrointestinal system: Abdomen is obese, nondistended, soft and nontender. Normal bowel sounds heard. Extremities: No cyanosis, clubbing, edema  Central nervous system: Alert and oriented. No focal neurological deficits. Moving extremities Skin: Right shin area with erythema, swelling, tenderness with quarter sized central eschar but no drainage Psychiatry: Judgement and insight appear normal. Mood &  affect appropriate.     Data Reviewed: I have personally reviewed following labs and imaging studies  CBC: Recent Labs  Lab 03/05/24 2107 03/09/24 2156 03/11/24 0313  WBC 12.2* 9.3 6.9  NEUTROABS 9.6* 5.1  --   HGB 12.4 11.6* 11.9*  HCT 39.0 36.5 39.7  MCV 78.3* 78.8* 82.9  PLT 396 452* 338   Basic Metabolic Panel: Recent Labs  Lab 03/05/24 2107 03/09/24 2156 03/10/24 0857 03/11/24 0313  NA 139 139  --  137  K 3.2* 3.1*  --  3.6  CL 103 101  --   105  CO2 24 27  --  24  GLUCOSE 124* 88  --  83  BUN 7 9  --  7  CREATININE 0.86 0.76  --  0.64  CALCIUM 9.3 9.5  --  8.3*  MG  --   --  2.2  --    GFR: Estimated Creatinine Clearance: 111.3 mL/min (by C-G formula based on SCr of 0.64 mg/dL). Liver Function Tests: Recent Labs  Lab 03/05/24 2107  AST 17  ALT 6  ALKPHOS 58  BILITOT 0.7  PROT 7.9  ALBUMIN 4.3   No results for input(s): "LIPASE", "AMYLASE" in the last 168 hours. No results for input(s): "AMMONIA" in the last 168 hours. Coagulation Profile: No results for input(s): "INR", "PROTIME" in the last 168 hours. Cardiac Enzymes: No results for input(s): "CKTOTAL", "CKMB", "CKMBINDEX", "TROPONINI" in the last 168 hours. BNP (last 3 results) No results for input(s): "PROBNP" in the last 8760 hours. HbA1C: No results for input(s): "HGBA1C" in the last 72 hours. CBG: No results for input(s): "GLUCAP" in the last 168 hours. Lipid Profile: No results for input(s): "CHOL", "HDL", "LDLCALC", "TRIG", "CHOLHDL", "LDLDIRECT" in the last 72 hours. Thyroid Function Tests: No results for input(s): "TSH", "T4TOTAL", "FREET4", "T3FREE", "THYROIDAB" in the last 72 hours. Anemia Panel: No results for input(s): "VITAMINB12", "FOLATE", "FERRITIN", "TIBC", "IRON", "RETICCTPCT" in the last 72 hours. Sepsis Labs: No results for input(s): "PROCALCITON", "LATICACIDVEN" in the last 168 hours.  No results found for this or any previous visit (from the past 240 hours).       Radiology Studies: DG Tibia/Fibula Right Result Date: 03/09/2024 EXAM: XRAY OF THE RIGHT TIBIA AND FIBULA 03/09/2024 10:06:44 PM COMPARISON: None available. CLINICAL HISTORY: Wound. Patient complains of right ankle swelling, tightness, and pain onset after 8-12 hours at work. Seen Thursday for spider bite - states it's getting better, but now the ankle is swelling up. FINDINGS: BONES AND JOINTS: Proximal tibia and fibula are within normal limits. Distal tibia/fibula  described on dedicated ankle radiographs. SOFT TISSUES: The soft tissues are unremarkable. IMPRESSION: 1. No acute osseous abnormality involving the proximal tibia and fibula. 2. Distal tibia/fibula described on dedicated ankle radiographs. Electronically signed by: Zadie Herter MD 03/09/2024 10:13 PM EDT RP Workstation: [id number]   DG Ankle Complete Right Result Date: 03/09/2024 CLINICAL DATA:  Right ankle pain and swelling after 812 hours at work. Seen Thursday for a spider bite. EXAM: RIGHT ANKLE - COMPLETE 3+ VIEW COMPARISON:  None Available. FINDINGS: Diffuse soft tissue swelling about the right ankle most prominent medially. There is an anterior avulsion fracture off of the distal tibia. No other focal bone lesion or bone destruction. Joint spaces are normal. No radiopaque soft tissue foreign bodies or soft tissue gas identified. Plantar and prominent Achilles calcaneal spurs present. IMPRESSION: 1. Diffuse soft tissue swelling about the right ankle. No radiopaque foreign body or soft tissue gas. 2. Avulsion  fracture demonstrated off of the anterior distal tibia. Electronically Signed   By: Boyce Byes M.D.   On: 03/09/2024 21:57        Scheduled Meds:  enoxaparin (LOVENOX) injection  50 mg Subcutaneous Q24H   levETIRAcetam   1,500 mg Oral QHS   Continuous Infusions:  vancomycin 1,000 mg (03/10/24 2256)          Audria Leather, MD [facility] 03/11/2024, 10:36 AM

## 2024-03-11 NOTE — Plan of Care (Signed)
  Problem: Education: Goal: Knowledge of General Education information will improve Description: Including pain rating scale, medication(s)/side effects and non-pharmacologic comfort measures Outcome: Progressing   Problem: Health Behavior/Discharge Planning: Goal: Ability to manage health-related needs will improve Outcome: Adequate for Discharge   Problem: Clinical Measurements: Goal: Ability to maintain clinical measurements within normal limits will improve Outcome: Progressing Goal: Will remain free from infection Outcome: Progressing Goal: Diagnostic test results will improve Outcome: Progressing Goal: Respiratory complications will improve Outcome: Progressing Goal: Cardiovascular complication will be avoided Outcome: Progressing   Problem: Activity: Goal: Risk for activity intolerance will decrease Outcome: Adequate for Discharge   Problem: Nutrition: Goal: Adequate nutrition will be maintained Outcome: Adequate for Discharge   Problem: Coping: Goal: Level of anxiety will decrease Outcome: Progressing   Problem: Elimination: Goal: Will not experience complications related to bowel motility Outcome: Progressing Goal: Will not experience complications related to urinary retention Outcome: Completed/Met   Problem: Pain Managment: Goal: General experience of comfort will improve and/or be controlled Outcome: Progressing   Problem: Safety: Goal: Ability to remain free from injury will improve Outcome: Progressing   Problem: Skin Integrity: Goal: Risk for impaired skin integrity will decrease Outcome: Progressing

## 2024-03-12 DIAGNOSIS — L03115 Cellulitis of right lower limb: Secondary | ICD-10-CM | POA: Diagnosis not present

## 2024-03-12 NOTE — Plan of Care (Signed)
  Problem: Education: Goal: Knowledge of General Education information will improve Description: Including pain rating scale, medication(s)/side effects and non-pharmacologic comfort measures Outcome: Adequate for Discharge   Problem: Health Behavior/Discharge Planning: Goal: Ability to manage health-related needs will improve Outcome: Adequate for Discharge   Problem: Clinical Measurements: Goal: Ability to maintain clinical measurements within normal limits will improve Outcome: Progressing Goal: Will remain free from infection Outcome: Progressing Goal: Diagnostic test results will improve Outcome: Progressing Goal: Respiratory complications will improve Outcome: Progressing Goal: Cardiovascular complication will be avoided Outcome: Progressing   Problem: Activity: Goal: Risk for activity intolerance will decrease Outcome: Completed/Met   Problem: Nutrition: Goal: Adequate nutrition will be maintained Outcome: Completed/Met   Problem: Coping: Goal: Level of anxiety will decrease Outcome: Progressing   Problem: Elimination: Goal: Will not experience complications related to bowel motility Outcome: Progressing   Problem: Pain Managment: Goal: General experience of comfort will improve and/or be controlled Outcome: Completed/Met   Problem: Safety: Goal: Ability to remain free from injury will improve Outcome: Adequate for Discharge   Problem: Skin Integrity: Goal: Risk for impaired skin integrity will decrease Outcome: Adequate for Discharge

## 2024-03-12 NOTE — Plan of Care (Signed)
   Problem: Safety: Goal: Ability to remain free from injury will improve Outcome: Progressing   Problem: Skin Integrity: Goal: Risk for impaired skin integrity will decrease Outcome: Progressing

## 2024-03-12 NOTE — Progress Notes (Addendum)
 PROGRESS NOTE    Diane Harvey  ZOX:[id number] DOB: [birth year] DOA: 03/09/2024 PCP: Arn Lane, MD   Brief Narrative:  43 y.o. female with medical history significant for seizure disorder and recent right lower extremity cellulitis following a possible spider bite 12 days prior to presentation which was being treated as an outpatient with oral Bactrim  followed by Keflex  and later doxycycline  presented with worsening right leg cellulitis.  She was started on IV antibiotics.  Assessment & Plan:   Right lower extremity cellulitis with failed outpatient antibiotic treatment -x-ray of right tibia/fibula did not show any acute osseous abnormality -Was treated with oral antibiotics and outpatient as above.  Continue IV vancomycin .  Cellulitis improving.  Pain improving.  Right anterior distal tibia avulsion fracture - No injury reported.  Patient has history of significant tendinitis, might be related.  Has tolerated PT. Outpatient orthopedics evaluation  Seizure disorder - Stable.  Continue Keppra   Hypokalemia - Improved  Thrombocytosis - Improved  Obesity class I -Outpatient follow-up  DVT prophylaxis: Lovenox  Code Status: Full Family Communication: None at bedside Disposition Plan: Status is: Inpatient Remains inpatient appropriate because: of severity of illness.  Need for IV antibiotics   Antimicrobials:  Anti-infectives (From admission, onward)    Start     Dose/Rate Route Frequency Ordered Stop   03/10/24 1100  vancomycin  (VANCOCIN ) IVPB 1000 mg/200 mL premix        1,000 mg 200 mL/hr over 60 Minutes Intravenous Every 12 hours 03/10/24 0734     03/09/24 2300  vancomycin  (VANCOCIN ) IVPB 1000 mg/200 mL premix       Placed in "Followed by" Linked Group   1,000 mg 200 mL/hr over 60 Minutes Intravenous  Once 03/09/24 2158 03/10/24 0043   03/09/24 2200  vancomycin  (VANCOCIN ) IVPB 1000 mg/200 mL premix  Status:  Discontinued        1,000 mg 200 mL/hr over 60 Minutes  Intravenous  Once 03/09/24 2156 03/09/24 2158   03/09/24 2200  vancomycin  (VANCOCIN ) IVPB 1000 mg/200 mL premix       Placed in "Followed by" Linked Group   1,000 mg 200 mL/hr over 60 Minutes Intravenous  Once 03/09/24 2158 03/09/24 2343        Subjective: Patient seen and examined at bedside.  Denies worsening shortness with, fever or vomiting. Objective: Vitals:   03/11/24 0552 03/11/24 1332 03/11/24 2138 03/12/24 0525  BP: 134/79 132/68 (!) 140/75 136/82  Pulse: 64 84 69 66  Resp: 18 (!) 22 18 18   Temp: 97.7 F (36.5 C) 99.5 F (37.5 C) 98 F (36.7 C) 97.9 F (36.6 C)  TempSrc: Oral  Oral Oral  SpO2: 96% 100% 99% 98%  Weight:      Height:        Intake/Output Summary (Last 24 hours) at 03/12/2024 0730 Last data filed at 03/11/2024 2330 Gross per 24 hour  Intake 400 ml  Output --  Net 400 ml   Filed Weights   03/10/24 0202  Weight: 106 kg    Examination:  General: Currently on room air.  No distress.  respiratory: Decreased breath sounds at bases bilaterally with some crackles CVS: Currently rate controlled; S1-S2 heard  abdominal: Soft, obese, nontender, slightly distended, no organomegaly; bowel sounds are heard  extremities: Trace lower extremity edema; no clubbing.   Skin: Right shin area with improving erythema, swelling, tenderness with central eschar but no drainage     Data Reviewed: I have personally reviewed following labs and imaging studies  CBC: Recent Labs  Lab 03/05/24 2107 03/09/24 2156 03/11/24 0313  WBC 12.2* 9.3 6.9  NEUTROABS 9.6* 5.1  --   HGB 12.4 11.6* 11.9*  HCT 39.0 36.5 39.7  MCV 78.3* 78.8* 82.9  PLT 396 452* 338   Basic Metabolic Panel: Recent Labs  Lab 03/05/24 2107 03/09/24 2156 03/10/24 0857 03/11/24 0313  NA 139 139  --  137  K 3.2* 3.1*  --  3.6  CL 103 101  --  105  CO2 24 27  --  24  GLUCOSE 124* 88  --  83  BUN 7 9  --  7  CREATININE 0.86 0.76  --  0.64  CALCIUM 9.3 9.5  --  8.3*  MG  --   --  2.2   --    GFR: Estimated Creatinine Clearance: 111.3 mL/min (by C-G formula based on SCr of 0.64 mg/dL). Liver Function Tests: Recent Labs  Lab 03/05/24 2107  AST 17  ALT 6  ALKPHOS 58  BILITOT 0.7  PROT 7.9  ALBUMIN 4.3   No results for input(s): "LIPASE", "AMYLASE" in the last 168 hours. No results for input(s): "AMMONIA" in the last 168 hours. Coagulation Profile: No results for input(s): "INR", "PROTIME" in the last 168 hours. Cardiac Enzymes: No results for input(s): "CKTOTAL", "CKMB", "CKMBINDEX", "TROPONINI" in the last 168 hours. BNP (last 3 results) No results for input(s): "PROBNP" in the last 8760 hours. HbA1C: No results for input(s): "HGBA1C" in the last 72 hours. CBG: No results for input(s): "GLUCAP" in the last 168 hours. Lipid Profile: No results for input(s): "CHOL", "HDL", "LDLCALC", "TRIG", "CHOLHDL", "LDLDIRECT" in the last 72 hours. Thyroid Function Tests: No results for input(s): "TSH", "T4TOTAL", "FREET4", "T3FREE", "THYROIDAB" in the last 72 hours. Anemia Panel: No results for input(s): "VITAMINB12", "FOLATE", "FERRITIN", "TIBC", "IRON", "RETICCTPCT" in the last 72 hours. Sepsis Labs: No results for input(s): "PROCALCITON", "LATICACIDVEN" in the last 168 hours.  No results found for this or any previous visit (from the past 240 hours).       Radiology Studies: No results found.       Scheduled Meds:  enoxaparin  (LOVENOX ) injection  50 mg Subcutaneous Q24H   levETIRAcetam   1,500 mg Oral QHS   Continuous Infusions:  vancomycin  Stopped (03/11/24 2330)          Audria Leather, MD [facility] 03/12/2024, 7:30 AM

## 2024-03-13 ENCOUNTER — Telehealth: Payer: Self-pay | Admitting: Adult Health

## 2024-03-13 ENCOUNTER — Other Ambulatory Visit (HOSPITAL_COMMUNITY): Payer: Self-pay

## 2024-03-13 DIAGNOSIS — L03115 Cellulitis of right lower limb: Secondary | ICD-10-CM | POA: Diagnosis not present

## 2024-03-13 MED ORDER — DOXYCYCLINE HYCLATE 100 MG PO CAPS
100.0000 mg | ORAL_CAPSULE | Freq: Two times a day (BID) | ORAL | 0 refills | Status: DC
  Filled 2024-03-13: qty 10, 5d supply, fill #0

## 2024-03-13 MED ORDER — CEPHALEXIN 500 MG PO CAPS
500.0000 mg | ORAL_CAPSULE | Freq: Three times a day (TID) | ORAL | 0 refills | Status: AC
  Filled 2024-03-13: qty 15, 5d supply, fill #0

## 2024-03-13 MED ORDER — DOXYCYCLINE HYCLATE 100 MG PO CAPS
100.0000 mg | ORAL_CAPSULE | Freq: Two times a day (BID) | ORAL | 0 refills | Status: AC
  Filled 2024-03-13: qty 10, 5d supply, fill #0

## 2024-03-13 NOTE — Plan of Care (Signed)
  Problem: Clinical Measurements: Goal: Ability to maintain clinical measurements within normal limits will improve Outcome: Progressing   Problem: Elimination: Goal: Will not experience complications related to bowel motility Outcome: Progressing   Problem: Safety: Goal: Ability to remain free from injury will improve Outcome: Progressing   

## 2024-03-13 NOTE — Plan of Care (Signed)
   Problem: Safety: Goal: Ability to remain free from injury will improve Outcome: Progressing   Problem: Skin Integrity: Goal: Risk for impaired skin integrity will decrease Outcome: Progressing

## 2024-03-13 NOTE — Telephone Encounter (Signed)
 Pt called requesting a refill on her levETIRAcetam  (KEPPRA  XR) 750 MG 24 hr tablet

## 2024-03-13 NOTE — Progress Notes (Signed)
Discharge package printed and instructions given to pt. Pt verbalizes understanding. 

## 2024-03-13 NOTE — Discharge Summary (Signed)
 Physician Discharge Summary  Diane Harvey AVW:[id number] DOB: 07/25/1981 DOA: 03/09/2024  PCP: Arn Lane, MD  Admit date: 03/09/2024 Discharge date: 03/13/2024  Admitted From: Home Disposition: Home  Recommendations for Outpatient Follow-up:  Follow up with PCP in 1 week  Outpatient evaluation and follow-up by orthopedics Follow up in ED if symptoms worsen or new appear   Home Health: No Equipment/Devices: None  Discharge Condition: Stable CODE STATUS: Full Diet recommendation: Regular  Brief/Interim Summary: 43 y.o. female with medical history significant for seizure disorder and recent right lower extremity cellulitis following a possible spider bite 12 days prior to presentation which was being treated as an outpatient with oral Bactrim  followed by Keflex  and later doxycycline  presented with worsening right leg cellulitis.  She was started on IV antibiotics.  During hospitalization, her cellulitis is much improved.  She feels much better and feels okay to go home today.  Will be discharged home today and will continue on Keflex  and doxycycline  for another 5 days.  Discharge Diagnoses:   Right lower extremity cellulitis with failed outpatient antibiotic treatment -x-ray of right tibia/fibula did not show any acute osseous abnormality -Was treated with oral antibiotics and outpatient as above.  Currently on IV vancomycin .   During hospitalization, her cellulitis is much improved.  She feels much better and feels okay to go home today.  Will be discharged home today and will continue on Keflex  and doxycycline  for another 5 days.   Right anterior distal tibia avulsion fracture - No injury reported.  Patient has history of significant tendinitis, might be related.  Has tolerated PT. Outpatient orthopedics evaluation   Seizure disorder - Stable.  Continue Keppra    Hypokalemia - Improved   Thrombocytosis - Improved   Obesity class I -Outpatient follow-up   Discharge  Instructions   Allergies as of 03/13/2024   No Known Allergies      Medication List     STOP taking these medications    sulfamethoxazole -trimethoprim  800-160 MG tablet Commonly known as: BACTRIM  DS       TAKE these medications    baclofen  10 MG tablet Commonly known as: LIORESAL  TAKE 1 TABLET BY MOUTH DAILY AS NEEDED FOR MUSCLE SPASMS. What changed: See the new instructions.   cephALEXin  500 MG capsule Commonly known as: KEFLEX  Take 1 capsule (500 mg total) by mouth 3 (three) times daily for 5 days. Start taking on: Mar 14, 2024 What changed: when to take this   doxycycline  100 MG capsule Commonly known as: VIBRAMYCIN  Take 1 capsule (100 mg total) by mouth 2 (two) times daily for 5 days. Start taking on: Mar 14, 2024   ibuprofen  400 MG tablet Commonly known as: ADVIL  Take 1 tablet (400 mg total) by mouth every 12 (twelve) hours as needed. What changed: reasons to take this   levETIRAcetam  750 MG 24 hr tablet Commonly known as: Keppra  XR Take 2 tablets (1,500 mg total) by mouth at bedtime.   norethindrone  0.35 MG tablet Commonly known as: MICRONOR  TAKE 1 TABLET BY MOUTH EVERY DAY          Follow-up Information     Arn Lane, MD. Schedule an appointment as soon as possible for a visit in 1 week(s).   Specialty: Family Medicine Contact information: [address] [place] [address] [phone]                No Known Allergies  Consultations: None   Procedures/Studies: DG Tibia/Fibula Right Result Date: 03/09/2024  EXAM: XRAY OF THE RIGHT TIBIA AND FIBULA 03/09/2024 10:06:44 PM COMPARISON: None available. CLINICAL HISTORY: Wound. Patient complains of right ankle swelling, tightness, and pain onset after 8-12 hours at work. Seen Thursday for spider bite - states it's getting better, but now the ankle is swelling up. FINDINGS: BONES AND JOINTS: Proximal tibia and fibula are within normal limits. Distal tibia/fibula described  on dedicated ankle radiographs. SOFT TISSUES: The soft tissues are unremarkable. IMPRESSION: 1. No acute osseous abnormality involving the proximal tibia and fibula. 2. Distal tibia/fibula described on dedicated ankle radiographs. Electronically signed by: Zadie Herter MD 03/09/2024 10:13 PM EDT RP Workstation: [id number]   DG Ankle Complete Right Result Date: 03/09/2024 CLINICAL DATA:  Right ankle pain and swelling after 812 hours at work. Seen Thursday for a spider bite. EXAM: RIGHT ANKLE - COMPLETE 3+ VIEW COMPARISON:  None Available. FINDINGS: Diffuse soft tissue swelling about the right ankle most prominent medially. There is an anterior avulsion fracture off of the distal tibia. No other focal bone lesion or bone destruction. Joint spaces are normal. No radiopaque soft tissue foreign bodies or soft tissue gas identified. Plantar and prominent Achilles calcaneal spurs present. IMPRESSION: 1. Diffuse soft tissue swelling about the right ankle. No radiopaque foreign body or soft tissue gas. 2. Avulsion fracture demonstrated off of the anterior distal tibia. Electronically Signed   By: Boyce Byes M.D.   On: 03/09/2024 21:57      Subjective: Patient seen and examined at bedside.  Feels much better and feels okay to go home today.  No fever or vomiting reported.  Discharge Exam: Vitals:   03/12/24 2214 03/13/24 0541  BP: 139/84 138/87  Pulse: 67 64  Resp: 18 18  Temp: 97.7 F (36.5 C) 97.6 F (36.4 C)  SpO2: 100% 93%    General: Pt is alert, awake, not in acute distress Cardiovascular: rate controlled, S1/S2 + Respiratory: bilateral decreased breath sounds at bases Abdominal: Soft, obese, NT, ND, bowel sounds + Extremities: Right shin area erythema, swelling and tenderness have much improved    The results of significant diagnostics from this hospitalization (including imaging, microbiology, ancillary and laboratory) are listed below for reference.     Microbiology: No  results found for this or any previous visit (from the past 240 hours).   Labs: BNP (last 3 results) No results for input(s): "BNP" in the last 8760 hours. Basic Metabolic Panel: Recent Labs  Lab 03/09/24 2156 03/10/24 0857 03/11/24 0313  NA 139  --  137  K 3.1*  --  3.6  CL 101  --  105  CO2 27  --  24  GLUCOSE 88  --  83  BUN 9  --  7  CREATININE 0.76  --  0.64  CALCIUM 9.5  --  8.3*  MG  --  2.2  --    Liver Function Tests: No results for input(s): "AST", "ALT", "ALKPHOS", "BILITOT", "PROT", "ALBUMIN" in the last 168 hours. No results for input(s): "LIPASE", "AMYLASE" in the last 168 hours. No results for input(s): "AMMONIA" in the last 168 hours. CBC: Recent Labs  Lab 03/09/24 2156 03/11/24 0313  WBC 9.3 6.9  NEUTROABS 5.1  --   HGB 11.6* 11.9*  HCT 36.5 39.7  MCV 78.8* 82.9  PLT 452* 338   Cardiac Enzymes: No results for input(s): "CKTOTAL", "CKMB", "CKMBINDEX", "TROPONINI" in the last 168 hours. BNP: Invalid input(s): "POCBNP" CBG: No results for input(s): "GLUCAP" in the last 168 hours. D-Dimer No results for  input(s): "DDIMER" in the last 72 hours. Hgb A1c No results for input(s): "HGBA1C" in the last 72 hours. Lipid Profile No results for input(s): "CHOL", "HDL", "LDLCALC", "TRIG", "CHOLHDL", "LDLDIRECT" in the last 72 hours. Thyroid function studies No results for input(s): "TSH", "T4TOTAL", "T3FREE", "THYROIDAB" in the last 72 hours.  Invalid input(s): "FREET3" Anemia work up No results for input(s): "VITAMINB12", "FOLATE", "FERRITIN", "TIBC", "IRON", "RETICCTPCT" in the last 72 hours. Urinalysis    Component Value Date/Time   COLORURINE STRAW (A) 01/05/2021 0045   APPEARANCEUR CLEAR 01/05/2021 0045   LABSPEC 1.011 01/05/2021 0045   PHURINE 6.0 01/05/2021 0045   GLUCOSEU NEGATIVE 01/05/2021 0045   HGBUR MODERATE (A) 01/05/2021 0045   BILIRUBINUR NEGATIVE 01/05/2021 0045   KETONESUR NEGATIVE 01/05/2021 0045   PROTEINUR NEGATIVE 01/05/2021  0045   NITRITE NEGATIVE 01/05/2021 0045   LEUKOCYTESUR NEGATIVE 01/05/2021 0045   Sepsis Labs Recent Labs  Lab 03/09/24 2156 03/11/24 0313  WBC 9.3 6.9   Microbiology No results found for this or any previous visit (from the past 240 hours).   Time coordinating discharge: 35 minutes  SIGNED:   Audria Leather, MD  [facility] 03/13/2024, 7:41 AM

## 2024-03-14 ENCOUNTER — Other Ambulatory Visit: Payer: Self-pay | Admitting: Family Medicine

## 2024-03-16 ENCOUNTER — Telehealth: Payer: Self-pay

## 2024-03-16 MED ORDER — LEVETIRACETAM ER 750 MG PO TB24: 1500.0000 mg | ORAL_TABLET | Freq: Every day | ORAL | 1 refills | Status: DC

## 2024-03-16 NOTE — Telephone Encounter (Signed)
 Patient is coming in Friday 05/09 @850 

## 2024-03-16 NOTE — Transitions of Care (Post Inpatient/ED Visit) (Signed)
   03/16/2024  Name: Diane Harvey MRN: [id number] DOB: June 03, 1981  Today's TOC FU Call Status: Today's TOC FU Call Status:: Successful TOC FU Call Completed TOC FU Call Complete Date: 03/16/24 Patient's Name and Date of Birth confirmed.  Transition Care Management Follow-up Telephone Call Date of Discharge: 03/13/24 Discharge Facility: [facility] Type of Discharge: Inpatient Admission Primary Inpatient Discharge Diagnosis:: cellulitis How have you been since you were released from the hospital?: Better Any questions or concerns?: No  Items Reviewed: Did you receive and understand the discharge instructions provided?: Yes Medications obtained,verified, and reconciled?: Yes (Medications Reviewed) Any new allergies since your discharge?: No Dietary orders reviewed?: Yes Do you have support at home?: Yes People in Home [RPT]: child(ren), adult  Medications Reviewed Today: Medications Reviewed Today     Reviewed by Darrall Ellison, LPN (Licensed Practical Nurse) on 03/16/24 at [phone]  Med List Status: <None>   Medication Order Taking? Sig Documenting Provider Last Dose Status Informant  baclofen  (LIORESAL ) 10 MG tablet [id number] No TAKE 1 TABLET BY MOUTH DAILY AS NEEDED FOR MUSCLE SPASMS.  Patient taking differently: Take 5 mg by mouth daily as needed for muscle spasms.   Arn Lane, MD Past Month Active Self, Multiple Informants, Pharmacy Records  cephALEXin  (KEFLEX ) 500 MG capsule [id number]  Take 1 capsule (500 mg total) by mouth 3 (three) times daily for 5 days. Audria Leather, MD  Active   doxycycline  (VIBRAMYCIN ) 100 MG capsule [id number]  Take 1 capsule (100 mg total) by mouth 2 (two) times daily for 5 days. Audria Leather, MD  Active   ibuprofen  (ADVIL ) 400 MG tablet [id number]  Take 1 tablet (400 mg total) by mouth every 12 (twelve) hours as needed for mild pain (pain score 1-3) or moderate pain (pain score 4-6). Arn Lane, MD  Active   levETIRAcetam  (KEPPRA   XR) 750 MG 24 hr tablet [id number]  Take 2 tablets (1,500 mg total) by mouth at bedtime. Johny Nap, NP  Active   norethindrone  (MICRONOR ) 0.35 MG tablet [id number] No TAKE 1 TABLET BY MOUTH EVERY DAY Arn Lane, MD Past Month Active Self, Multiple Informants, Pharmacy Records            Home Care and Equipment/Supplies: Were Home Health Services Ordered?: NA Any new equipment or medical supplies ordered?: NA  Functional Questionnaire: Do you need assistance with bathing/showering or dressing?: No Do you need assistance with meal preparation?: No Do you need assistance with eating?: No Do you have difficulty maintaining continence: No Do you need assistance with getting out of bed/getting out of a chair/moving?: No Do you have difficulty managing or taking your medications?: No  Follow up appointments reviewed: PCP Follow-up appointment confirmed?: No (sent message to staff to schedule) MD Provider Line Number:[phone] Given: No Specialist Hospital Follow-up appointment confirmed?: NA Do you need transportation to your follow-up appointment?: No Do you understand care options if your condition(s) worsen?: Yes-patient verbalized understanding    SIGNATURE Darrall Ellison, LPN [facility] Nurse Health Advisor Direct Dial [phone]

## 2024-03-16 NOTE — Telephone Encounter (Signed)
Refill sent for the pt

## 2024-03-20 ENCOUNTER — Ambulatory Visit (INDEPENDENT_AMBULATORY_CARE_PROVIDER_SITE_OTHER): Admitting: Family Medicine

## 2024-03-20 ENCOUNTER — Encounter: Payer: Self-pay | Admitting: Family Medicine

## 2024-03-20 VITALS — BP 137/71 | Ht 67.0 in | Wt 217.0 lb

## 2024-03-20 DIAGNOSIS — S82301D Unspecified fracture of lower end of right tibia, subsequent encounter for closed fracture with routine healing: Secondary | ICD-10-CM | POA: Diagnosis not present

## 2024-03-20 DIAGNOSIS — L0390 Cellulitis, unspecified: Secondary | ICD-10-CM

## 2024-03-20 NOTE — Patient Instructions (Signed)
 Glad you are feeling better  Be sure to attend your appointment with [facility]

## 2024-03-20 NOTE — Progress Notes (Signed)
    SUBJECTIVE:   CHIEF COMPLAINT / HPI:   Hospital follow up  Admitted 4/28-5/2 for RLE cellulitis after a spider bite and right anterior distal tibia avulsion fracture Was treated with IV vancomycin  and discharged on Keflex  and doxycycline  for 5 days Plan for outpatient orthopedic evaluation for fracture - seeing TFA on 5/27  Completed course of abx No fevers Leg looks and feels better Pain well controlled Denies numbness, weakness in lower extremity   PERTINENT  PMH / PSH: History of seizure disorder on Keppra   OBJECTIVE:   BP 137/71   Ht 5\' 7"  (1.702 m)   Wt 217 lb (98.4 kg)   LMP 02/25/2024   BMI 33.99 kg/m   General: NAD, pleasant, able to participate in exam Respiratory: No respiratory distress Psych: Normal affect and mood  RLE: See image below - wound appears improved compared to prior images. Bite wound with central necrosis and surrounding skin changes but less erythematous compared to prior. Leg is nontender with very minimal swelling, no tightness Pt does have some slight tenderness over anterior tibia but able to bear weight NVI distally - normal sensation and pulses    ASSESSMENT/PLAN:   Assessment & Plan Wound cellulitis Improved, s/p abx, pain well controlled, no evidence of neurovascular compromise or compartment syndrome Continue to monitor over time, discussed return precautions Closed fracture of distal end of right tibia with routine healing, unspecified fracture morphology, subsequent encounter Discussed with Dr. Pryor Browning, no need for immobilization unless patient desires, ok to f/u with TFA at scheduled appt Discussed return precautions and supportive care with patient    Edison Gore, MD [facility]

## 2024-04-07 ENCOUNTER — Ambulatory Visit: Admitting: Podiatry

## 2024-04-07 ENCOUNTER — Ambulatory Visit (INDEPENDENT_AMBULATORY_CARE_PROVIDER_SITE_OTHER)

## 2024-04-07 ENCOUNTER — Encounter: Payer: Self-pay | Admitting: Podiatry

## 2024-04-07 VITALS — Ht 67.0 in | Wt 217.0 lb

## 2024-04-07 DIAGNOSIS — M9261 Juvenile osteochondrosis of tarsus, right ankle: Secondary | ICD-10-CM

## 2024-04-07 DIAGNOSIS — M7661 Achilles tendinitis, right leg: Secondary | ICD-10-CM | POA: Diagnosis not present

## 2024-04-07 DIAGNOSIS — S8251XA Displaced fracture of medial malleolus of right tibia, initial encounter for closed fracture: Secondary | ICD-10-CM | POA: Diagnosis not present

## 2024-04-07 DIAGNOSIS — M25571 Pain in right ankle and joints of right foot: Secondary | ICD-10-CM | POA: Diagnosis not present

## 2024-04-07 DIAGNOSIS — M62462 Contracture of muscle, left lower leg: Secondary | ICD-10-CM

## 2024-04-07 MED ORDER — MELOXICAM 15 MG PO TABS
15.0000 mg | ORAL_TABLET | Freq: Every day | ORAL | 3 refills | Status: DC
Start: 2024-04-07 — End: 2024-06-24

## 2024-04-07 NOTE — Patient Instructions (Addendum)
 Call [facility] to schedule your MRI at the below locations.  Please allow at least 1 business day after your visit to process the referral.  It may take longer depending on approval from insurance.  Please let me know if you have issues or problems scheduling the MRI   [facility] [phone] [address] [place] [address] [place], [place] [address]  [facility] [phone] [address] [place], [place] [address]   Achilles Tendinitis  with Rehab Achilles tendinitis is a disorder of the Achilles tendon. The Achilles tendon connects the large calf muscles (Gastrocnemius and Soleus) to the heel bone (calcaneus). This tendon is sometimes called the heel cord. It is important for pushing-off and standing on your toes and is important for walking, running, or jumping. Tendinitis is often caused by overuse and repetitive microtrauma. SYMPTOMS Pain, tenderness, swelling, warmth, and redness may occur over the Achilles tendon even at rest. Pain with pushing off, or flexing or extending the ankle. Pain that is worsened after or during activity. CAUSES  Overuse sometimes seen with rapid increase in exercise programs or in sports requiring running and jumping. Poor physical conditioning (strength and flexibility or endurance). Running sports, especially training running down hills. Inadequate warm-up before practice or play or failure to stretch before participation. Injury to the tendon. PREVENTION  Warm up and stretch before practice or competition. Allow time for adequate rest and recovery between practices and competition. Keep up conditioning. Keep up ankle and leg flexibility. Improve or keep muscle strength and endurance. Improve cardiovascular fitness. Use proper technique. Use proper equipment (shoes, skates). To help prevent recurrence, taping, protective strapping, or an adhesive bandage may be recommended for several weeks after  healing is complete. PROGNOSIS  Recovery may take weeks to several months to heal. Longer recovery is expected if symptoms have been prolonged. Recovery is usually quicker if the inflammation is due to a direct blow as compared with overuse or sudden strain. RELATED COMPLICATIONS  Healing time will be prolonged if the condition is not correctly treated. The injury must be given plenty of time to heal. Symptoms can reoccur if activity is resumed too soon. Untreated, tendinitis may increase the risk of tendon rupture requiring additional time for recovery and possibly surgery. TREATMENT  The first treatment consists of rest anti-inflammatory medication, and ice to relieve the pain. Stretching and strengthening exercises after resolution of pain will likely help reduce the risk of recurrence. Referral to a physical therapist or athletic trainer for further evaluation and treatment may be helpful. A walking boot or cast may be recommended to rest the Achilles tendon. This can help break the cycle of inflammation and microtrauma. Arch supports (orthotics) may be prescribed or recommended by your caregiver as an adjunct to therapy and rest. Surgery to remove the inflamed tendon lining or degenerated tendon tissue is rarely necessary and has shown less than predictable results. MEDICATION  Nonsteroidal anti-inflammatory medications, such as aspirin and ibuprofen , may be used for pain and inflammation relief. Do not take within 7 days before surgery. Take these as directed by your caregiver. Contact your caregiver immediately if any bleeding, stomach upset, or signs of allergic reaction occur. Other minor pain relievers, such as acetaminophen , may also be used. Pain relievers may be prescribed as necessary by your caregiver. Do not take prescription pain medication for longer than 4 to 7 days. Use only as directed and only as much as you need. Cortisone injections are rarely  indicated. Cortisone injections  may weaken tendons and predispose to rupture. It is better to give the condition more time to heal than to use them. HEAT AND COLD Cold is used to relieve pain and reduce inflammation for acute and chronic Achilles tendinitis. Cold should be applied for 10 to 15 minutes every 2 to 3 hours for inflammation and pain and immediately after any activity that aggravates your symptoms. Use ice packs or an ice massage. Heat may be used before performing stretching and strengthening activities prescribed by your caregiver. Use a heat pack or a warm soak. SEEK MEDICAL CARE IF: Symptoms get worse or do not improve in 2 weeks despite treatment. New, unexplained symptoms develop. Drugs used in treatment may produce side effects.  EXERCISES:  RANGE OF MOTION (ROM) AND STRETCHING EXERCISES - Achilles Tendinitis  These exercises may help you when beginning to rehabilitate your injury. Your symptoms may resolve with or without further involvement from your physician, physical therapist or athletic trainer. While completing these exercises, remember:  Restoring tissue flexibility helps normal motion to return to the joints. This allows healthier, less painful movement and activity. An effective stretch should be held for at least 30 seconds. A stretch should never be painful. You should only feel a gentle lengthening or release in the stretched tissue.  STRETCH  Gastroc, Standing  Place hands on wall. Extend right / left leg, keeping the front knee somewhat bent. Slightly point your toes inward on your back foot. Keeping your right / left heel on the floor and your knee straight, shift your weight toward the wall, not allowing your back to arch. You should feel a gentle stretch in the right / left calf. Hold this position for 10 seconds. Repeat 3 times. Complete this stretch 2 times per day.  STRETCH  Soleus, Standing  Place hands on wall. Extend right / left leg, keeping the other knee somewhat  bent. Slightly point your toes inward on your back foot. Keep your right / left heel on the floor, bend your back knee, and slightly shift your weight over the back leg so that you feel a gentle stretch deep in your back calf. Hold this position for 10 seconds. Repeat 3 times. Complete this stretch 2 times per day.  STRETCH  Gastrocsoleus, Standing  Note: This exercise can place a lot of stress on your foot and ankle. Please complete this exercise only if specifically instructed by your caregiver.  Place the ball of your right / left foot on a step, keeping your other foot firmly on the same step. Hold on to the wall or a rail for balance. Slowly lift your other foot, allowing your body weight to press your heel down over the edge of the step. You should feel a stretch in your right / left calf. Hold this position for 10 seconds. Repeat this exercise with a slight bend in your knee. Repeat 3 times. Complete this stretch 2 times per day.   STRENGTHENING EXERCISES - Achilles Tendinitis These exercises may help you when beginning to rehabilitate your injury. They may resolve your symptoms with or without further involvement from your physician, physical therapist or athletic trainer. While completing these exercises, remember:  Muscles can gain both the endurance and the strength needed for everyday activities through controlled exercises. Complete these exercises as instructed by your physician, physical therapist or athletic trainer. Progress the resistance and repetitions only as guided. You may experience muscle soreness or fatigue, but the pain or discomfort  you are trying to eliminate should never worsen during these exercises. If this pain does worsen, stop and make certain you are following the directions exactly. If the pain is still present after adjustments, discontinue the exercise until you can discuss the trouble with your clinician.  STRENGTH - Plantar-flexors  Sit with your right  / left leg extended. Holding onto both ends of a rubber exercise band/tubing, loop it around the ball of your foot. Keep a slight tension in the band. Slowly push your toes away from you, pointing them downward. Hold this position for 10 seconds. Return slowly, controlling the tension in the band/tubing. Repeat 3 times. Complete this exercise 2 times per day.   STRENGTH - Plantar-flexors  Stand with your feet shoulder width apart. Steady yourself with a wall or table using as little support as needed. Keeping your weight evenly spread over the width of your feet, rise up on your toes.* Hold this position for 10 seconds. Repeat 3 times. Complete this exercise 2 times per day.  *If this is too easy, shift your weight toward your right / left leg until you feel challenged. Ultimately, you may be asked to do this exercise with your right / left foot only.  STRENGTH  Plantar-flexors, Eccentric  Note: This exercise can place a lot of stress on your foot and ankle. Please complete this exercise only if specifically instructed by your caregiver.  Place the balls of your feet on a step. With your hands, use only enough support from a wall or rail to keep your balance. Keep your knees straight and rise up on your toes. Slowly shift your weight entirely to your right / left toes and pick up your opposite foot. Gently and with controlled movement, lower your weight through your right / left foot so that your heel drops below the level of the step. You will feel a slight stretch in the back of your calf at the end position. Use the healthy leg to help rise up onto the balls of both feet, then lower weight only on the right / left leg again. Build up to 15 repetitions. Then progress to 3 consecutive sets of 15 repetitions.* After completing the above exercise, complete the same exercise with a slight knee bend (about 30 degrees). Again, build up to 15 repetitions. Then progress to 3 consecutive sets of 15  repetitions.* Perform this exercise 2 times per day.  *When you easily complete 3 sets of 15, your physician, physical therapist or athletic trainer may advise you to add resistance by wearing a backpack filled with additional weight.  STRENGTH - Plantar Flexors, Seated  Sit on a chair that allows your feet to rest flat on the ground. If necessary, sit at the edge of the chair. Keeping your toes firmly on the ground, lift your right / left heel as far as you can without increasing any discomfort in your ankle. Repeat 3 times. Complete this exercise 2 times a day.

## 2024-04-07 NOTE — Progress Notes (Signed)
 Subjective:  Patient ID: Diane Harvey, female    DOB: [birth year],  MRN: [id number]  Chief Complaint  Patient presents with   Foot Pain    Patient is here for right ankle pain HX of Achilles tendinitis of both lower extremities, Gastrocnemius equinus of right lower extremity, Haglund's deformity of right heel      Discussed the use of AI scribe software for clinical note transcription with the patient, who gave verbal consent to proceed.  History of Present Illness Diane Harvey is a 43 year old female who presents with persistent right ankle pain and Achilles tendon issues.  She experiences persistent pain in her right ankle, initially identified as a fracture in the front of her ankle during a hospital visit in early May for a spider bite. She denies any recent injuries but recalls a past ankle injury during pregnancy with her oldest son, who is now 54 years old.  The pain sometimes radiates upwards from the ankle, particularly in the morning, and is more severe than the discomfort from her Achilles tendon. 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 She tries to take breaks to manage the pain.  She has been using ibuprofen  for pain management but reports no significant improvement. She has previously been placed (April and May 2024) in a CAM boot and performed home physical therapy without improvement. She has not noticed any new medical issues and does not smoke.  No pain in the left foot, which does not bother her anymore. No new medical issues or problems with other areas of her health.      Objective:    Physical Exam VASCULAR: DP and PT pulse palpable. Foot is warm and well-perfused. Capillary fill time is brisk. DERMATOLOGIC: Normal skin turgor, texture, and temperature. No open lesions, rashes, or ulcerations. NEUROLOGIC: Normal sensation to light touch and  pressure. No paresthesias. ORTHOPEDIC: Pain on palpation of anterior joint line of right ankle and anterior medial malleolus. Mild edema in anterior joint line of right ankle. Pain on palpation of midsubstance of right Achilles tendon. Palpable spurring at insertion of right Achilles tendon in posterior heel. No ecchymosis or bruising. No gross deformity.   No images are attached to the encounter.    Results RADIOLOGY Ankle X-ray (03/12/2024): Anterior collicular avulsion fracture of the distal tibia age-indeterminate, Haglund deformity, heel spur. Ankle X-ray (02/27/2023): Posterior calcaneal spur, Haglund deformity.   Assessment:   1. Acute right ankle pain      Plan:  Patient was evaluated and treated and all questions answered.  Assessment and Plan Assessment & Plan Chronic Achilles tendinosis Chronic Achilles tendinosis with persistent symptoms despite previous treatment with a [place] and home physical therapy. Pain is located in the midsubstance of the Achilles and at the insertion, with palpable spurring in the posterior heel. Associated with a Haglund deformity and heel spur, both of which have increased in size since the last visit. Surgical intervention may be necessary, involving detachment and reattachment of the Achilles tendon, with a recovery period of several months. MRI will help determine the extent of the condition and guide surgical planning. - Order MRI of the ankle to evaluate the Achilles tendon and bone spur for possible surgical planning. - Prescribe meloxicam  for anti-inflammatory purposes. - Provide heel lifts for shoes to alleviate pressure on the tendon. - Advise wearing  supportive tennis shoes instead of Crocs to reduce foot motion and pressure. - Provide home exercises to continue working on tendon flexibility and strength.  Haglund deformity and heel spur Haglund deformity and heel spur with increased size since the last visit, contributing to the  chronic Achilles tendinosis. Located in the posterior heel. Surgical intervention may be considered based on MRI findings, involving shaving off the bone spur and reattaching the Achilles tendon, with a recovery period of several months. MRI will help determine the extent of the condition and guide surgical planning. - Order MRI of the ankle to assess the extent of the Haglund deformity and heel spur for surgical planning. - Discuss potential surgical options, including shaving off the bone spur and reattaching the Achilles tendon, with her.  Anterior collicular avulsion fracture of distal tibia Anterior collicular avulsion fracture of the distal tibia identified on radiographs. The fracture may be a new injury or a sequela of a previous injury, presenting with pain in the front of the ankle. Differential diagnosis includes arthritis-like changes due to previous injury. MRI will help evaluate the fracture and assess for any arthritis-like changes. - Order MRI of the ankle to evaluate the anterior collicular avulsion fracture and assess for any arthritis-like changes. - Consider arthroscopy and/or excision of fracture fragment      Return for call for follow up 2 weeks after MRI to review.

## 2024-04-10 ENCOUNTER — Other Ambulatory Visit: Payer: Self-pay | Admitting: Family Medicine

## 2024-06-23 ENCOUNTER — Other Ambulatory Visit: Payer: Self-pay | Admitting: Family Medicine

## 2024-06-23 ENCOUNTER — Other Ambulatory Visit: Payer: Self-pay | Admitting: Adult Health

## 2024-06-23 DIAGNOSIS — G9389 Other specified disorders of brain: Secondary | ICD-10-CM

## 2024-06-23 DIAGNOSIS — G40909 Epilepsy, unspecified, not intractable, without status epilepticus: Secondary | ICD-10-CM

## 2024-06-23 DIAGNOSIS — N946 Dysmenorrhea, unspecified: Secondary | ICD-10-CM

## 2024-07-16 ENCOUNTER — Telehealth: Admitting: Nurse Practitioner

## 2024-07-16 DIAGNOSIS — H109 Unspecified conjunctivitis: Secondary | ICD-10-CM

## 2024-07-16 MED ORDER — OFLOXACIN 0.3 % OP SOLN: 1.0000 [drp] | Freq: Four times a day (QID) | OPHTHALMIC | 0 refills | Status: AC

## 2024-07-16 NOTE — Progress Notes (Signed)

## 2024-07-20 ENCOUNTER — Ambulatory Visit

## 2024-07-20 VITALS — BP 154/83 | HR 71 | Ht 67.0 in | Wt 207.8 lb

## 2024-07-20 DIAGNOSIS — S0501XA Injury of conjunctiva and corneal abrasion without foreign body, right eye, initial encounter: Secondary | ICD-10-CM

## 2024-07-20 MED ORDER — VALACYCLOVIR HCL 500 MG PO TABS: 500.0000 mg | ORAL_TABLET | Freq: Three times a day (TID) | ORAL | 0 refills | Status: AC

## 2024-07-20 NOTE — Progress Notes (Signed)
    SUBJECTIVE:   CHIEF COMPLAINT / HPI: eye pain and redness  On Wednesday, she had onset of right eye pain, swelling. No noted injury. Pain worse with exposure to perfumes and outdoors. No increased tearing or purulent drainage. Her eye is slightly swollen but does not stay shut in the morning. She saw a telehealth NP on 07/16/24 and was prescribed ofloxacin  for bacterial conjunctivitis. Her symptoms have not improved since then.   One time before she had similar symptoms that resolved with ofloxacin .  PERTINENT  PMH / PSH: none  OBJECTIVE:   BP (!) 154/83   Pulse 71   Ht 5' 7 (1.702 m)   Wt 207 lb 12.8 oz (94.3 kg)   LMP 07/04/2024   SpO2 99%   BMI 32.55 kg/m    Right eye: Slight edema of the upper and lower lid. Medial scleritis. Focal whitish infiltrate or purulence along the inferomedial border on initial exam. Does not migrate in the eye. No conjunctivitis. No lesions along either eyelid. Fluoroscein eye exam performed with corneal defect apparent at the inferomedial border where the infiltrate/purulence appeared.   ASSESSMENT/PLAN:   Assessment & Plan Abrasion of right cornea, initial encounter Fluorescein exam performed with evidence of nonhealing corneal abrasion. Consulted ophthalmology, [facility], who recommended a 10 day course of Valtrex  500 TID. Symptoms expected to be resolved after 10 day course, and ophthalmology consult recommended if symptoms not improved at that point.  She will continue her ofloxacin  eye drops until follow up.  Scheduled follow up in 10 days with PCP for further management if her symptoms have not improved.      Diane Miramontes Alena Morrison, MD [facility]

## 2024-07-21 ENCOUNTER — Ambulatory Visit

## 2024-07-31 ENCOUNTER — Ambulatory Visit: Admitting: Family Medicine

## 2024-09-15 ENCOUNTER — Ambulatory Visit (INDEPENDENT_AMBULATORY_CARE_PROVIDER_SITE_OTHER): Admitting: Family Medicine

## 2024-09-15 ENCOUNTER — Ambulatory Visit: Payer: Self-pay | Admitting: Family Medicine

## 2024-09-15 ENCOUNTER — Encounter: Payer: Self-pay | Admitting: Family Medicine

## 2024-09-15 VITALS — BP 143/97 | HR 70 | Ht 67.0 in | Wt 205.4 lb

## 2024-09-15 DIAGNOSIS — E785 Hyperlipidemia, unspecified: Secondary | ICD-10-CM

## 2024-09-15 DIAGNOSIS — G8929 Other chronic pain: Secondary | ICD-10-CM

## 2024-09-15 DIAGNOSIS — R5383 Other fatigue: Secondary | ICD-10-CM | POA: Diagnosis not present

## 2024-09-15 DIAGNOSIS — Z1231 Encounter for screening mammogram for malignant neoplasm of breast: Secondary | ICD-10-CM

## 2024-09-15 DIAGNOSIS — N920 Excessive and frequent menstruation with regular cycle: Secondary | ICD-10-CM | POA: Diagnosis not present

## 2024-09-15 DIAGNOSIS — N809 Endometriosis, unspecified: Secondary | ICD-10-CM

## 2024-09-15 DIAGNOSIS — Z0000 Encounter for general adult medical examination without abnormal findings: Secondary | ICD-10-CM | POA: Diagnosis present

## 2024-09-15 DIAGNOSIS — Z23 Encounter for immunization: Secondary | ICD-10-CM

## 2024-09-15 DIAGNOSIS — M545 Low back pain, unspecified: Secondary | ICD-10-CM

## 2024-09-15 DIAGNOSIS — I10 Essential (primary) hypertension: Secondary | ICD-10-CM | POA: Insufficient documentation

## 2024-09-15 LAB — POCT HEMOGLOBIN: Hemoglobin: 11.7 g/dL (ref 11–14.6)

## 2024-09-15 MED ORDER — KETOROLAC TROMETHAMINE 30 MG/ML IJ SOLN
30.0000 mg | Freq: Once | INTRAMUSCULAR | Status: AC
  Administered 2024-09-15: 30 mg via INTRAMUSCULAR

## 2024-09-15 MED ORDER — NAPROXEN 500 MG PO TABS: 500.0000 mg | ORAL_TABLET | Freq: Two times a day (BID) | ORAL | 1 refills | Status: DC | PRN

## 2024-09-15 MED ORDER — AMLODIPINE BESYLATE 5 MG PO TABS: 5.0000 mg | ORAL_TABLET | Freq: Every day | ORAL | 1 refills | Status: AC

## 2024-09-15 NOTE — Patient Instructions (Signed)
 Mammogram: What to Expect A mammogram is an X-ray of your breasts. It's done to check for changes that aren't normal. If you're female, you may need this test if you have a lump or swelling in your breast tissue. If you're female, you'll likely need to start getting this test at around age 45. You may need to get one sooner if you're at higher risk for breast cancer. This test can look for changes caused by breast cancer or by other problems, such as: Mastitis. This is when breast tissue gets irritated and swollen. An abscess. This is an infected area filled with pus. A pocket of fluid called a cyst. An abnormal growth of cells that isn't cancer. Fibrocystic changes. These changes can make breast tissue feel dense, bumpy, or uneven under the skin. Tell a health care provider: About any allergies you have. If you have breast implants. If you've had: Breast disease. A biopsy. This is when a small piece of tissue is removed for testing. Surgery. If someone else in your family has had breast cancer. If you're breastfeeding. Whether you're pregnant or may be pregnant. What are the risks? Your health care provider will talk with you about risks. These may include: Being exposed to radiation. Radiation levels are very low with this test. The need for more tests. The results not being read properly. Trouble finding breast cancer if you're a female with dense breasts. What happens before the test? Have this test done about 1-2 weeks after your menstrual period. This is when your breasts are the least tender. If you're going to a new provider, have any past mammogram pictures sent to your new provider. Wash your breasts and under your arms on the day of the test. On the day of the test, do not wear: Deodorants. Perfumes. Lotions. Powders. Take off any jewelry from your neck. Wear clothes that you can change into and out of easily. What happens during the test?  You'll take off your clothes  from the waist up. You'll put on a gown. You'll stand in front of the X-ray machine. Each breast will be put between two plastic or glass plates by the person who runs the X-ray. The plates will press down on your breast for a few seconds. Try to relax. You may be asked to hold your breath. The pressing down won't cause any harm to your breasts. It may not feel comfortable, but it'll be very brief. X-rays will be taken from different angles of each breast. These steps may vary. Ask what you can expect. What can I expect after the test? The test will be read by a provider called a radiologist. You may need to do parts of the test again if the pictures aren't clear enough. Follow these instructions at home: You may go back to your normal activities right away. Ask when your test results will be ready and how to get them. You may need to call or meet with your provider to get the results. This information is not intended to replace advice given to you by your health care provider. Make sure you discuss any questions you have with your health care provider. Document Revised: 09/10/2023 Document Reviewed: 04/09/2023 Elsevier Patient Education  2024 [employer].

## 2024-09-15 NOTE — Assessment & Plan Note (Addendum)
 Menstrual cycle with heavy flow and hemoglobin at 11 g/dL.  - Ordered point of care hemoglobin test. Result normal.  - Thyroid function test ordered - Consider further evaluation in the future if no improvement

## 2024-09-15 NOTE — Progress Notes (Signed)
 SUBJECTIVE:   Chief compliant/HPI: annual examination  Diane Harvey is a 43 y.o. who presents today for an annual exam.  Discussed the use of AI scribe software for clinical note transcription with the patient, who gave verbal consent to proceed.  History of Present Illness   Diane Harvey is a 43 year old female who presents for an annual physical exam.  She has a history of elevated blood pressure but is not currently on any antihypertensive medication and has not been monitoring her blood pressure at home.  She experiences chronic lower back pain and reports a history of arthritis and bulging discs at L5 to S1, with a lumbar spine MRI performed in 2019. The pain is exacerbated by her work schedule, which involves working 10 to 12 hours a day, six days a week. She rates the pain as a '10' on a scale of 1 to 10 at its worst, particularly after lying down and attempting to get up. Ibuprofen  400 mg, even when taken as two tablets, does not alleviate the pain. She uses baclofen  10 mg as needed, but it causes drowsiness, so she avoids daily use. She also takes 'pain quill' daily for pain relief.  Her medication regimen includes Keppra  1500 mg daily at bedtime for seizure management and Micronor  daily for birth control.  She reports feeling tired occasionally, despite taking elderberry, magnesium , and fish oil supplements, which she feels have been somewhat helpful. Her hemoglobin was slightly low at 11 g/dL in April 2025, and she experiences heavy menstrual periods lasting 7 to 8 days, using 6 to 7 pads per day during the first few heavy days. She is currently on her period  No current symptoms of fatigue or tiredness. She reports not feeling safe in her current relationship and denies intimate partner violence.      Review of systems form notable for - see hx.   Updated history tabs and problem list PMHx reviewed.   OBJECTIVE:   BP (!) 143/97   Pulse 70   Ht 5' 7 (1.702 m)   Wt 205  lb 6 oz (93.2 kg)   SpO2 97%   BMI 32.17 kg/m   Physical Exam   VITALS: BP- 150/100 HEENT: Ears normal, tympanic membranes normal. Oropharynx normal. CHEST: Lungs clear to auscultation. CARDIOVASCULAR: No heart murmurs. ABDOMEN: Normal bowel sounds, no tenderness, no distention. EXTREMITIES: No sensory loss, no leg edema. MUSCULOSKELETAL: Limited range of motion in lower back with tenderness on movement. NEUROLOGICAL: Good motor strength.      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 Declined COVID and flu vaccines, agreed to HPV and hepatitis B vaccines. - Rechecked blood pressure. - Ordered point of care hemoglobin test. - Ordered cholesterol check. - Ordered mammogram and instructed to schedule appointment. - Provided advanced directive form for review. - Administered HPV and hepatitis B vaccines.  Annual Examination  See AVS for age appropriate recommendations.   PHQ score  Flowsheet Row Office Visit from 09/15/2024 in [facility] [place]  PHQ-9 Total Score 1   , reviewed and discussed. Blood pressure reviewed and at goal: no.  Asked about intimate partner violence and patient reports none.  The patient currently uses OCP for contraception. Folate recommended as appropriate, minimum of 400 mcg per day.  Advanced directives discussed   Considered the following items based upon USPSTF recommendations: HIV testing:recently completed and result reviewed, normal  Hepatitis C: recently completed and result reviewed, normal  Hepatitis B:Discussed Syphilis if at high risk: recently completed and result reviewed,  normal  GC/CT not at high risk and not ordered. Lipid panel (nonfasting or fasting) discussed based upon AHA recommendations and ordered.  Consider repeat every 4-6 years.  Reviewed risk factors for latent tuberculosis and not indicated.  Discussed family history, BRCA testing not indicated. Tool used to risk stratify was Pedigree Assessment tool N/A  Cervical cancer screening: Up to date Immunizations Declined COVID and flu vaccines, agreed to HPV and hepatitis B vaccines. - Administered HPV and hepatitis B vaccines.  MyChart Activation:Already signed up   Follow up in 1   year or sooner if indicated.  Other fatigue  Hyperlipidemia, unspecified hyperlipidemia type Due for cholesterol check. - Ordered cholesterol check. 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 Ibuprofen  ineffective, baclofen  causes drowsiness. Discussed naproxen  for pain management and potential spine specialist referral. - Administered Toradol  30 mg injection for back pain. - Prescribed naproxen  500 mg twice daily as needed. - Continue baclofen  as needed. - Consider referral to spine specialist if no improvement. Essential hypertension Chronic hypertension with recent reading of 150/100 mmHg. Repeat BP improved but still above goal. Discussed starting antihypertensive medication. - Started amlodipine 5 mg daily. - Advised to monitor blood pressure at home with goal of less than 140/90 mmHg. - Scheduled follow-up in 4 weeks to recheck blood pressure. Menorrhagia with regular cycle Menstrual cycle with heavy flow and hemoglobin at 11 g/dL.  - Ordered point of care hemoglobin test. Result normal.  - Thyroid function test ordered - Consider further evaluation in the future if no improvement Encounter for screening mammogram for malignant  neoplasm of breast She will schedule mammogram Order placed.    Otto Fairly, MD [facility]

## 2024-09-15 NOTE — Assessment & Plan Note (Signed)
 Due for cholesterol check. - Ordered cholesterol check.

## 2024-09-15 NOTE — Assessment & Plan Note (Addendum)
 Chronic hypertension with recent reading of 150/100 mmHg. Repeat BP improved but still above goal. Discussed starting antihypertensive medication. - Started amlodipine 5 mg daily. - Advised to monitor blood pressure at home with goal of less than 140/90 mmHg. - Scheduled follow-up in 4 weeks to recheck blood pressure.

## 2024-09-15 NOTE — Addendum Note (Signed)
 Addended by: ANDERS CUMINS T on: 09/15/2024 05:30 PM   Modules accepted: Level of Service

## 2024-09-16 DIAGNOSIS — N809 Endometriosis, unspecified: Secondary | ICD-10-CM | POA: Insufficient documentation

## 2024-09-16 LAB — LIPID PANEL
Chol/HDL Ratio: 2.9 ratio (ref 0.0–4.4)
Cholesterol, Total: 189 mg/dL (ref 100–199)
HDL: 66 mg/dL (ref >39)
LDL Chol Calc (NIH): 110 mg/dL — ABNORMAL HIGH (ref 0–99)
Triglycerides: 72 mg/dL (ref 0–149)
VLDL Cholesterol Cal: 13 mg/dL (ref 5–40)

## 2024-09-16 LAB — TSH RFX ON ABNORMAL TO FREE T4: TSH: 2.27 u[IU]/mL (ref 0.450–4.500)

## 2024-10-23 ENCOUNTER — Ambulatory Visit

## 2024-11-12 ENCOUNTER — Other Ambulatory Visit: Payer: Self-pay | Admitting: Adult Health

## 2024-11-16 ENCOUNTER — Other Ambulatory Visit: Payer: Self-pay | Admitting: Family Medicine

## 2024-11-17 NOTE — Telephone Encounter (Signed)
 Phone rep called pt , and left a brief vm asking pt calls office to schedule needed f/u, office # left on vm.
